# Patient Record
Sex: Female | Born: 1985 | Hispanic: No | Marital: Married | Smoking: Never smoker
Health system: Southern US, Community
[De-identification: ages and names within clinical notes are randomized; demographics above are authoritative.]

## PROBLEM LIST (undated history)

## (undated) ENCOUNTER — Inpatient Hospital Stay (HOSPITAL_COMMUNITY): Payer: Self-pay

## (undated) DIAGNOSIS — R519 Headache, unspecified: Secondary | ICD-10-CM

## (undated) DIAGNOSIS — K439 Ventral hernia without obstruction or gangrene: Secondary | ICD-10-CM

## (undated) DIAGNOSIS — N390 Urinary tract infection, site not specified: Secondary | ICD-10-CM

## (undated) DIAGNOSIS — O24419 Gestational diabetes mellitus in pregnancy, unspecified control: Secondary | ICD-10-CM

## (undated) DIAGNOSIS — O139 Gestational [pregnancy-induced] hypertension without significant proteinuria, unspecified trimester: Secondary | ICD-10-CM

## (undated) DIAGNOSIS — R51 Headache: Secondary | ICD-10-CM

## (undated) DIAGNOSIS — E039 Hypothyroidism, unspecified: Secondary | ICD-10-CM

## (undated) HISTORY — PX: HERNIA REPAIR: SHX51

## (undated) HISTORY — DX: Gestational diabetes mellitus in pregnancy, unspecified control: O24.419

## (undated) HISTORY — DX: Urinary tract infection, site not specified: N39.0

---

## 2010-01-18 NOTE — L&D Delivery Note (Signed)
Delivery Note At 2:52 AM a viable and healthy female was delivered via Vaginal, Spontaneous Delivery (Presentation: Left Occiput Anterior).  APGAR: 9, 9; weight 6 lbs 11oz.   Placenta status: Intact, Spontaneous.  Cord: 3 vessels with single nuchal cord.  Anesthesia: Epidural  Episiotomy: None Lacerations: 1st degree;Perineal, cervix inspected, uterus explored Suture Repair: 3.0 chromic Est. Blood Loss (mL): 350  Mom to postpartum.  Baby to nursery-stable.  Hadrian Yarbrough D 11/18/2010, 3:13 AM

## 2010-05-14 LAB — HEPATITIS B SURFACE ANTIGEN: Hepatitis B Surface Ag: NEGATIVE

## 2010-05-14 LAB — RPR: RPR: NONREACTIVE

## 2010-05-14 LAB — RUBELLA ANTIBODY, IGM: Rubella: IMMUNE

## 2010-05-14 LAB — ABO/RH

## 2010-05-14 LAB — ANTIBODY SCREEN: Antibody Screen: NEGATIVE

## 2010-08-02 ENCOUNTER — Encounter (HOSPITAL_COMMUNITY): Payer: Self-pay | Admitting: *Deleted

## 2010-08-02 ENCOUNTER — Inpatient Hospital Stay (HOSPITAL_COMMUNITY)
Admission: AD | Admit: 2010-08-02 | Discharge: 2010-08-02 | Disposition: A | Discharge status: Home / Self Care | Payer: Medicaid Other | Source: Ambulatory Visit | Attending: Obstetrics and Gynecology | Admitting: Obstetrics and Gynecology

## 2010-08-02 DIAGNOSIS — O9989 Other specified diseases and conditions complicating pregnancy, childbirth and the puerperium: Secondary | ICD-10-CM

## 2010-08-02 DIAGNOSIS — O99891 Other specified diseases and conditions complicating pregnancy: Secondary | ICD-10-CM

## 2010-08-02 DIAGNOSIS — K219 Gastro-esophageal reflux disease without esophagitis: Secondary | ICD-10-CM | POA: Diagnosis present

## 2010-08-02 DIAGNOSIS — R109 Unspecified abdominal pain: Secondary | ICD-10-CM | POA: Insufficient documentation

## 2010-08-02 DIAGNOSIS — Z3490 Encounter for supervision of normal pregnancy, unspecified, unspecified trimester: Secondary | ICD-10-CM

## 2010-08-02 HISTORY — DX: Gastro-esophageal reflux disease without esophagitis: K21.9

## 2010-08-02 HISTORY — DX: Hypothyroidism, unspecified: E03.9

## 2010-08-02 LAB — URINALYSIS, ROUTINE W REFLEX MICROSCOPIC
Glucose, UA: NEGATIVE mg/dL
Hgb urine dipstick: NEGATIVE
Specific Gravity, Urine: 1.01 (ref 1.005–1.030)
Urobilinogen, UA: 0.2 mg/dL (ref 0.0–1.0)
pH: 7 (ref 5.0–8.0)

## 2010-08-02 MED ORDER — RANITIDINE HCL 150 MG PO TABS: 150.0000 mg | ORAL_TABLET | Freq: Two times a day (BID) | ORAL | Status: DC

## 2010-08-02 NOTE — Progress Notes (Signed)
Patient is here with c/o epigastric and mid back pain. She states that she ate a bowl of cereal at 2300 and went to bed at 0030. She states that the pain was too intense to sleep. She states that the epigastric pain has eased up now. She does not appear to be in distress. She is breathing with ease. Denies any vaginal bleeding, lof or discharge.

## 2010-08-02 NOTE — ED Provider Notes (Signed)
History     Chief Complaint  Patient presents with  . Abdominal Pain   HPI G1 @ 22.[redacted] wks EGA. Onset of epigastric pain after laying down tonight, radiated through to back, no n/v, ate roast, rice, green beans for dinner, cereal right before bed. No cramping or vaginal bleeding.   OB History    Grav Para Term Preterm Abortions TAB SAB Ect Mult Living   1               Past Medical History  Diagnosis Date  . Hypothyroidism     History reviewed. No pertinent past surgical history.  No family history on file.  History  Substance Use Topics  . Smoking status: Never Smoker   . Smokeless tobacco: Not on file  . Alcohol Use: No    Allergies: No Known Allergies  Prescriptions prior to admission  Medication Sig Dispense Refill  . levothyroxine (SYNTHROID, LEVOTHROID) 25 MCG tablet Take 25 mcg by mouth daily.          Review of Systems  Constitutional: Negative.   Respiratory: Negative.   Cardiovascular: Negative.   Gastrointestinal: Positive for abdominal pain. Negative for nausea, vomiting, diarrhea and constipation.  Genitourinary: Negative.   Skin: Negative.   Neurological: Negative.   Psychiatric/Behavioral: Negative.    Physical Exam   Blood pressure 120/77, pulse 101, temperature 98.6 F (37 C), temperature source Oral, resp. rate 20, height 6' 4.5" (1.943 m), weight 78.382 kg (172 lb 12.8 oz).  Physical Exam  Constitutional: She is oriented to person, place, and time. She appears well-developed and well-nourished. No distress.  Cardiovascular: Normal rate.   Respiratory: Effort normal.  GI: Soft. Bowel sounds are normal. She exhibits no distension and no mass. There is no tenderness. There is no rebound and no guarding.  Musculoskeletal: Normal range of motion.  Neurological: She is alert and oriented to person, place, and time.  Skin: Skin is warm and dry.  Psychiatric: She has a normal mood and affect.    MAU Course  Procedures  Consult with Dr.  Henderson Cloud, may d/c to home with precautions.   A/P: 25 yo G1 @ 22.[redacted] wks EGA, GERD Rx ranitidine 150 mg BID PRN  F/U as scheduled or sooner PRN

## 2010-08-02 NOTE — Progress Notes (Signed)
Pt states, " Pain started in my upper abd at 12:30 am. It was hurting so bad I couldn't go to sleep. It hurts in my upper abd when I breathe."

## 2010-10-27 LAB — STREP B DNA PROBE: GBS: NEGATIVE

## 2010-11-17 ENCOUNTER — Inpatient Hospital Stay (HOSPITAL_COMMUNITY)
Admission: AD | Admit: 2010-11-17 | Discharge: 2010-11-20 | DRG: 775 | Disposition: A | Discharge status: Home / Self Care | Payer: Medicaid Other | Source: Ambulatory Visit | Attending: Obstetrics and Gynecology | Admitting: Obstetrics and Gynecology

## 2010-11-17 ENCOUNTER — Encounter (HOSPITAL_COMMUNITY): Payer: Self-pay | Admitting: *Deleted

## 2010-11-17 DIAGNOSIS — O99284 Endocrine, nutritional and metabolic diseases complicating childbirth: Secondary | ICD-10-CM | POA: Diagnosis present

## 2010-11-17 DIAGNOSIS — E039 Hypothyroidism, unspecified: Secondary | ICD-10-CM | POA: Diagnosis present

## 2010-11-17 DIAGNOSIS — E079 Disorder of thyroid, unspecified: Secondary | ICD-10-CM | POA: Diagnosis present

## 2010-11-17 DIAGNOSIS — O4290 Premature rupture of membranes, unspecified as to length of time between rupture and onset of labor, unspecified weeks of gestation: Secondary | ICD-10-CM | POA: Diagnosis present

## 2010-11-17 LAB — CBC
MCV: 91.6 fL (ref 78.0–100.0)
Platelets: 218 10*3/uL (ref 150–400)
RBC: 3.91 MIL/uL (ref 3.87–5.11)
RDW: 14.6 % (ref 11.5–15.5)
WBC: 10.9 10*3/uL — ABNORMAL HIGH (ref 4.0–10.5)

## 2010-11-17 LAB — RPR: RPR Ser Ql: NONREACTIVE

## 2010-11-17 MED ORDER — OXYTOCIN 20 UNITS IN LACTATED RINGERS INFUSION - SIMPLE
1.0000 m[IU]/min | INTRAVENOUS | Status: DC
  Administered 2010-11-17: 2 m[IU]/min via INTRAVENOUS
  Filled 2010-11-17: qty 1000

## 2010-11-17 MED ORDER — IBUPROFEN 600 MG PO TABS
600.0000 mg | ORAL_TABLET | Freq: Four times a day (QID) | ORAL | Status: DC | PRN
  Filled 2010-11-17 (×6): qty 1

## 2010-11-17 MED ORDER — OXYCODONE-ACETAMINOPHEN 5-325 MG PO TABS
2.0000 | ORAL_TABLET | ORAL | Status: DC | PRN
  Administered 2010-11-17: 2 via ORAL
  Filled 2010-11-17: qty 2
  Filled 2010-11-17: qty 1
  Filled 2010-11-17: qty 2

## 2010-11-17 MED ORDER — FENTANYL 2.5 MCG/ML BUPIVACAINE 1/10 % EPIDURAL INFUSION (WH - ANES)
14.0000 mL/h | INTRAMUSCULAR | Status: DC
  Administered 2010-11-18: 14 mL/h via EPIDURAL
  Filled 2010-11-17: qty 60

## 2010-11-17 MED ORDER — EPHEDRINE 5 MG/ML INJ
10.0000 mg | INTRAVENOUS | Status: DC | PRN
  Filled 2010-11-17 (×2): qty 4

## 2010-11-17 MED ORDER — PHENYLEPHRINE 40 MCG/ML (10ML) SYRINGE FOR IV PUSH (FOR BLOOD PRESSURE SUPPORT)
80.0000 ug | PREFILLED_SYRINGE | INTRAVENOUS | Status: DC | PRN
  Filled 2010-11-17 (×2): qty 5

## 2010-11-17 MED ORDER — LIDOCAINE HCL (PF) 1 % IJ SOLN
30.0000 mL | INTRAMUSCULAR | Status: DC | PRN
  Filled 2010-11-17 (×2): qty 30

## 2010-11-17 MED ORDER — ACETAMINOPHEN 325 MG PO TABS
650.0000 mg | ORAL_TABLET | ORAL | Status: DC | PRN
  Administered 2010-11-17: 650 mg via ORAL
  Filled 2010-11-17: qty 2

## 2010-11-17 MED ORDER — OXYTOCIN 20 UNITS IN LACTATED RINGERS INFUSION - SIMPLE: 125.0000 mL/h | Freq: Once | INTRAVENOUS | Status: DC

## 2010-11-17 MED ORDER — FLEET ENEMA 7-19 GM/118ML RE ENEM: 1.0000 | ENEMA | RECTAL | Status: DC | PRN

## 2010-11-17 MED ORDER — ONDANSETRON HCL 4 MG/2ML IJ SOLN: 4.0000 mg | Freq: Four times a day (QID) | INTRAMUSCULAR | Status: DC | PRN

## 2010-11-17 MED ORDER — CITRIC ACID-SODIUM CITRATE 334-500 MG/5ML PO SOLN: 30.0000 mL | ORAL | Status: DC | PRN

## 2010-11-17 MED ORDER — LACTATED RINGERS IV SOLN: 500.0000 mL | INTRAVENOUS | Status: DC | PRN

## 2010-11-17 MED ORDER — PHENYLEPHRINE 40 MCG/ML (10ML) SYRINGE FOR IV PUSH (FOR BLOOD PRESSURE SUPPORT)
80.0000 ug | PREFILLED_SYRINGE | INTRAVENOUS | Status: DC | PRN
  Filled 2010-11-17: qty 5

## 2010-11-17 MED ORDER — EPHEDRINE 5 MG/ML INJ
10.0000 mg | INTRAVENOUS | Status: DC | PRN
  Filled 2010-11-17: qty 4

## 2010-11-17 MED ORDER — OXYTOCIN BOLUS FROM INFUSION
500.0000 mL | Freq: Once | INTRAVENOUS | Status: AC
  Administered 2010-11-18: 500 mL via INTRAVENOUS
  Filled 2010-11-17: qty 500

## 2010-11-17 MED ORDER — BUTORPHANOL TARTRATE 2 MG/ML IJ SOLN: 1.0000 mg | INTRAMUSCULAR | Status: DC | PRN

## 2010-11-17 MED ORDER — TERBUTALINE SULFATE 1 MG/ML IJ SOLN: 0.2500 mg | Freq: Once | INTRAMUSCULAR | Status: AC | PRN

## 2010-11-17 MED ORDER — LEVOTHYROXINE SODIUM 25 MCG PO TABS
25.0000 ug | ORAL_TABLET | Freq: Every day | ORAL | Status: DC
  Filled 2010-11-17: qty 1

## 2010-11-17 MED ORDER — LACTATED RINGERS IV SOLN
INTRAVENOUS | Status: DC
  Administered 2010-11-17 (×2): via INTRAVENOUS

## 2010-11-17 MED ORDER — LACTATED RINGERS IV SOLN: 500.0000 mL | Freq: Once | INTRAVENOUS | Status: DC

## 2010-11-17 MED ORDER — DIPHENHYDRAMINE HCL 50 MG/ML IJ SOLN: 12.5000 mg | INTRAMUSCULAR | Status: DC | PRN

## 2010-11-17 NOTE — Progress Notes (Signed)
Pt states went to MD office today for eval of vaginal fluid, +amniotic fluid, to be a direct admission, however [facility] no available beds. +FM today, denies bleeding or other vaginal d/c issues prior to SROM. Unsure of specific time yesterday SROM occurred.

## 2010-11-17 NOTE — H&P (Signed)
  25 y.o. G 1P 0  Estimated Date of Delivery: 12/01/10 admitted at [redacted] weeks gestation with PROM. Prenatal course was uncomplicated.  Patient presented to the office this morning with a 2 day history of leaking fluid.  Ultrasound today showed normal AFV and vertex presentation.  Speculum exam showed pooling and the sample from the pool was strongly fern positive. Prenatal labs: Blood Type: O+.  Screening tests for HIV, Syphilis, Hepatitis B, Rubella sensitivity, gestational diabetes, and perineal group B strep colonization were negative.    Afebrile, VSS Heart and Lungs: No active disease Abdomen: soft, gravid, EFW 6 - 7 lbs. Cervical exam:  Deferred due to PROM  Impression: PROM, 38 weeks  Plan:  Start IV pitocin, start cervical checks when patient enters active labor.

## 2010-11-17 NOTE — Plan of Care (Signed)
Problem: Consults Goal: [facility] Patient Information Press F2 to bring up selections list  Outcome: Completed/Met Date Met:  11/17/10  Pt 37-[redacted] weeks EGA and Inpatient induction

## 2010-11-18 ENCOUNTER — Inpatient Hospital Stay (HOSPITAL_COMMUNITY): Payer: Medicaid Other | Admitting: Anesthesiology

## 2010-11-18 ENCOUNTER — Encounter (HOSPITAL_COMMUNITY): Payer: Self-pay | Admitting: Anesthesiology

## 2010-11-18 ENCOUNTER — Encounter (HOSPITAL_COMMUNITY): Payer: Self-pay

## 2010-11-18 LAB — ABO/RH: ABO/RH(D): O POS

## 2010-11-18 MED ORDER — LEVOTHYROXINE SODIUM 25 MCG PO TABS
25.0000 ug | ORAL_TABLET | Freq: Every day | ORAL | Status: DC
  Administered 2010-11-18 – 2010-11-20 (×3): 25 ug via ORAL
  Filled 2010-11-18 (×4): qty 1

## 2010-11-18 MED ORDER — BENZOCAINE-MENTHOL 20-0.5 % EX AERO
1.0000 "application " | INHALATION_SPRAY | CUTANEOUS | Status: DC | PRN
  Administered 2010-11-18 – 2010-11-19 (×2): 1 via TOPICAL
  Filled 2010-11-18: qty 56

## 2010-11-18 MED ORDER — ZOLPIDEM TARTRATE 5 MG PO TABS: 5.0000 mg | ORAL_TABLET | Freq: Every evening | ORAL | Status: DC | PRN

## 2010-11-18 MED ORDER — SENNOSIDES-DOCUSATE SODIUM 8.6-50 MG PO TABS
2.0000 | ORAL_TABLET | Freq: Every day | ORAL | Status: DC
  Administered 2010-11-18 – 2010-11-19 (×2): 2 via ORAL

## 2010-11-18 MED ORDER — LIDOCAINE HCL 1.5 % IJ SOLN
INTRAMUSCULAR | Status: DC | PRN
  Administered 2010-11-18: 2 mL via INTRADERMAL
  Administered 2010-11-18 (×2): 5 mL via INTRADERMAL

## 2010-11-18 MED ORDER — FAMOTIDINE 20 MG PO TABS
20.0000 mg | ORAL_TABLET | Freq: Every day | ORAL | Status: DC
  Administered 2010-11-19: 20 mg via ORAL
  Filled 2010-11-18 (×3): qty 1

## 2010-11-18 MED ORDER — INFLUENZA VIRUS VACC SPLIT PF IM SUSP
0.5000 mL | Freq: Once | INTRAMUSCULAR | Status: AC
  Administered 2010-11-19: 0.5 mL via INTRAMUSCULAR
  Filled 2010-11-18: qty 0.5

## 2010-11-18 MED ORDER — LANOLIN HYDROUS EX OINT: TOPICAL_OINTMENT | CUTANEOUS | Status: DC | PRN

## 2010-11-18 MED ORDER — SIMETHICONE 80 MG PO CHEW: 80.0000 mg | CHEWABLE_TABLET | ORAL | Status: DC | PRN

## 2010-11-18 MED ORDER — PRENATAL PLUS 27-1 MG PO TABS
1.0000 | ORAL_TABLET | Freq: Every day | ORAL | Status: DC
  Administered 2010-11-18 – 2010-11-20 (×3): 1 via ORAL
  Filled 2010-11-18 (×4): qty 1

## 2010-11-18 MED ORDER — ONDANSETRON HCL 4 MG/2ML IJ SOLN: 4.0000 mg | INTRAMUSCULAR | Status: DC | PRN

## 2010-11-18 MED ORDER — OXYCODONE-ACETAMINOPHEN 5-325 MG PO TABS
1.0000 | ORAL_TABLET | ORAL | Status: DC | PRN
  Administered 2010-11-18 – 2010-11-19 (×2): 1 via ORAL
  Administered 2010-11-20: 2 via ORAL
  Filled 2010-11-18: qty 1

## 2010-11-18 MED ORDER — DIBUCAINE 1 % RE OINT
1.0000 "application " | TOPICAL_OINTMENT | RECTAL | Status: DC | PRN
  Filled 2010-11-18: qty 28

## 2010-11-18 MED ORDER — ONDANSETRON HCL 4 MG PO TABS: 4.0000 mg | ORAL_TABLET | ORAL | Status: DC | PRN

## 2010-11-18 MED ORDER — IBUPROFEN 600 MG PO TABS
600.0000 mg | ORAL_TABLET | Freq: Four times a day (QID) | ORAL | Status: DC
  Administered 2010-11-18 – 2010-11-20 (×10): 600 mg via ORAL
  Filled 2010-11-18 (×4): qty 1

## 2010-11-18 MED ORDER — DIPHENHYDRAMINE HCL 25 MG PO CAPS: 25.0000 mg | ORAL_CAPSULE | Freq: Four times a day (QID) | ORAL | Status: DC | PRN

## 2010-11-18 MED ORDER — WITCH HAZEL-GLYCERIN EX PADS: 1.0000 "application " | MEDICATED_PAD | CUTANEOUS | Status: DC | PRN

## 2010-11-18 MED ORDER — BENZOCAINE-MENTHOL 20-0.5 % EX AERO
INHALATION_SPRAY | CUTANEOUS | Status: AC
  Administered 2010-11-18: 1 via TOPICAL
  Filled 2010-11-18: qty 56

## 2010-11-18 MED ORDER — TETANUS-DIPHTH-ACELL PERTUSSIS 5-2.5-18.5 LF-MCG/0.5 IM SUSP
0.5000 mL | Freq: Once | INTRAMUSCULAR | Status: AC
  Administered 2010-11-19: 0.5 mL via INTRAMUSCULAR
  Filled 2010-11-18: qty 0.5

## 2010-11-18 NOTE — Progress Notes (Signed)
UR chart review completed.  

## 2010-11-18 NOTE — Anesthesia Preprocedure Evaluation (Signed)
Anesthesia Evaluation  Patient identified by MRN, date of birth, ID band Patient awake  General Assessment Comment  Reviewed: Allergy & Precautions, H&P , NPO status , Patient's Chart, lab work & pertinent test results, reviewed documented beta blocker date and time   History of Anesthesia Complications Negative for: history of anesthetic complications  Airway Mallampati: II TM Distance: >3 FB Neck ROM: full    Dental  (+) Teeth Intact   Pulmonary  clear to auscultation        Cardiovascular regular Normal    Neuro/Psych Negative Neurological ROS  Negative Psych ROS   GI/Hepatic Neg liver ROS  GERD Medicated  Endo/Other  Hypothyroidism   Renal/GU negative Renal ROS     Musculoskeletal   Abdominal   Peds  Hematology negative hematology ROS (+)   Anesthesia Other Findings   Reproductive/Obstetrics (+) Pregnancy                           Anesthesia Physical Anesthesia Plan  ASA: II  Anesthesia Plan: Epidural   Post-op Pain Management:    Induction:   Airway Management Planned:   Additional Equipment:   Intra-op Plan:   Post-operative Plan:   Informed Consent: I have reviewed the patients History and Physical, chart, labs and discussed the procedure including the risks, benefits and alternatives for the proposed anesthesia with the patient or authorized representative who has indicated his/her understanding and acceptance.     Plan Discussed with:   Anesthesia Plan Comments:         Anesthesia Quick Evaluation

## 2010-11-18 NOTE — Progress Notes (Signed)
Stable; breast feeding.  On .025 mg. of synthroid for hypothyroidism -- ordered for this AM and daily.  CBC ordered for tomorrow.

## 2010-11-18 NOTE — Anesthesia Postprocedure Evaluation (Signed)
  Anesthesia Post-op Note  Patient: Catherine Schroeder  Procedure(s) Performed: * No procedures listed *  Patient Location: Mother/Baby  Anesthesia Type: Epidural  Level of Consciousness: awake, alert  and oriented  Airway and Oxygen Therapy: Patient Spontanous Breathing  Post-op Pain: none  Post-op Assessment: Post-op Vital signs reviewed and Patient's Cardiovascular Status Stable  Post-op Vital Signs: Reviewed and stable  Complications: No apparent anesthesia complications

## 2010-11-18 NOTE — Anesthesia Procedure Notes (Signed)
Epidural Patient location during procedure: OB Start time: 11/18/2010 12:26 AM Reason for block: procedure for pain  Staffing Performed by: anesthesiologist   Preanesthetic Checklist Completed: patient identified, site marked, surgical consent, pre-op evaluation, timeout performed, IV checked, risks and benefits discussed and monitors and equipment checked  Epidural Patient position: sitting Prep: site prepped and draped and DuraPrep Patient monitoring: continuous pulse ox and blood pressure Approach: midline Injection technique: LOR air  Needle:  Needle type: Tuohy  Needle gauge: 17 G Needle length: 9 cm Needle insertion depth: 5 cm cm Catheter type: closed end flexible Catheter size: 19 Gauge Catheter at skin depth: 10 cm Test dose: negative  Assessment Events: blood not aspirated, injection not painful, no injection resistance, negative IV test and no paresthesia  Additional Notes Discussed risk of headache, infection, bleeding, nerve injury and failed or incomplete block.  Patient voices understanding and wishes to proceed.

## 2010-11-19 DIAGNOSIS — Z23 Encounter for immunization: Secondary | ICD-10-CM

## 2010-11-19 LAB — CBC
Platelets: 197 10*3/uL (ref 150–400)
RDW: 14.8 % (ref 11.5–15.5)
WBC: 15.7 10*3/uL — ABNORMAL HIGH (ref 4.0–10.5)

## 2010-11-19 MED ORDER — BENZOCAINE-MENTHOL 20-0.5 % EX AERO
INHALATION_SPRAY | CUTANEOUS | Status: AC
  Administered 2010-11-19: 1 via TOPICAL
  Filled 2010-11-19: qty 56

## 2010-11-19 NOTE — Progress Notes (Signed)
PPD#1 Pt without c/o. Ambulating well. Lochia-mild. IMP/stable. PLAN/ routine care.

## 2010-11-20 NOTE — Discharge Summary (Signed)
RN reviewed discharge instructions with patient. No new medications were prescribed for the patient, she was instructed to take over the counter ibuprofen as needed for pain. Patient verbalized understanding. She was excited about going home. Patient was walked off the unit by RN with her baby and husband. Patient left the campus in a private vehicle with baby and husband.

## 2010-11-20 NOTE — Discharge Summary (Signed)
Obstetric Discharge Summary Reason for Admission: rupture of membranes Prenatal Procedures: none Intrapartum Procedures: spontaneous vaginal delivery Postpartum Procedures: none Complications-Operative and Postpartum: 1 degree perineal laceration Hemoglobin  Date Value Range Status  11/19/2010 11.4* 12.0-15.0 (g/dL) Final     HCT  Date Value Range Status  11/19/2010 33.2* 36.0-46.0 (%) Final    Discharge Diagnoses: Term Pregnancy-delivered  Discharge Information: Date: 11/20/2010 Activity: pelvic rest Diet: routine Medications: Ibuprofen Condition: stable Instructions: refer to practice specific booklet Discharge to: home Follow-up Information    Follow up with [facility]. Make an appointment in 4 months.   Contact information:   [address] [place] [address] [phone]          Newborn Data: Live born female  Birth Weight: 6 lb 11.4 oz (3045 g) APGAR: 9, 9  Home with mother.  Catherine Schroeder A 11/20/2010, 7:45 AM

## 2010-11-20 NOTE — Progress Notes (Signed)
Patient is eating, ambulating, voiding.  Pain control is good.  Filed Vitals:   11/19/10 0636 11/19/10 1445 11/19/10 2053 11/20/10 0600  BP: 110/71 109/67 112/75 109/69  Pulse: 84 88 82 67  Temp: 98.6 F (37 C) 98.4 F (36.9 C) 98.2 F (36.8 C) 98.2 F (36.8 C)  TempSrc: Axillary Oral Oral Axillary  Resp: 18 18 18 18   Height:      Weight:      SpO2:        Fundus firm Perineum without swelling.  Lab Results  Component Value Date   WBC 15.7* 11/19/2010   HGB 11.4* 11/19/2010   HCT 33.2* 11/19/2010   MCV 91.5 11/19/2010   PLT 197 11/19/2010    --/--/O POS (10/30 1730)/RI  A/P Post partum day 2.  Routine care.  Expect d/c per plan.    Esco Joslyn A

## 2011-01-08 ENCOUNTER — Encounter (HOSPITAL_COMMUNITY)
Admission: EM | Disposition: A | Discharge status: Home / Self Care | Payer: Self-pay | Source: Home / Self Care | Attending: Gastroenterology

## 2011-01-08 ENCOUNTER — Emergency Department (HOSPITAL_COMMUNITY): Payer: Medicaid Other

## 2011-01-08 ENCOUNTER — Inpatient Hospital Stay (HOSPITAL_COMMUNITY)
Admission: EM | Admit: 2011-01-08 | Discharge: 2011-01-10 | DRG: 419 | Disposition: A | Discharge status: Home / Self Care | Payer: Medicaid Other | Attending: Gastroenterology | Admitting: Gastroenterology

## 2011-01-08 ENCOUNTER — Encounter (HOSPITAL_COMMUNITY): Payer: Self-pay | Admitting: Emergency Medicine

## 2011-01-08 DIAGNOSIS — K8060 Calculus of gallbladder and bile duct with cholecystitis, unspecified, without obstruction: Principal | ICD-10-CM | POA: Diagnosis present

## 2011-01-08 DIAGNOSIS — K831 Obstruction of bile duct: Secondary | ICD-10-CM

## 2011-01-08 DIAGNOSIS — E039 Hypothyroidism, unspecified: Secondary | ICD-10-CM | POA: Diagnosis present

## 2011-01-08 DIAGNOSIS — K5900 Constipation, unspecified: Secondary | ICD-10-CM | POA: Diagnosis not present

## 2011-01-08 DIAGNOSIS — R109 Unspecified abdominal pain: Secondary | ICD-10-CM

## 2011-01-08 DIAGNOSIS — K8064 Calculus of gallbladder and bile duct with chronic cholecystitis without obstruction: Principal | ICD-10-CM | POA: Diagnosis present

## 2011-01-08 DIAGNOSIS — K8020 Calculus of gallbladder without cholecystitis without obstruction: Secondary | ICD-10-CM

## 2011-01-08 DIAGNOSIS — K8010 Calculus of gallbladder with chronic cholecystitis without obstruction: Secondary | ICD-10-CM

## 2011-01-08 DIAGNOSIS — R740 Nonspecific elevation of levels of transaminase and lactic acid dehydrogenase [LDH]: Secondary | ICD-10-CM

## 2011-01-08 DIAGNOSIS — R932 Abnormal findings on diagnostic imaging of liver and biliary tract: Secondary | ICD-10-CM

## 2011-01-08 HISTORY — PX: ERCP: SHX5425

## 2011-01-08 HISTORY — PX: EUS: SHX5427

## 2011-01-08 LAB — CBC
HCT: 41.9 % (ref 36.0–46.0)
MCH: 31.4 pg (ref 26.0–34.0)
MCH: 30.8 pg (ref 26.0–34.0)
MCHC: 34.9 g/dL (ref 30.0–36.0)
MCHC: 33.9 g/dL (ref 30.0–36.0)
MCV: 89.9 fL (ref 78.0–100.0)
MCV: 90.9 fL (ref 78.0–100.0)
RDW: 14.1 % (ref 11.5–15.5)
WBC: 7.7 10*3/uL (ref 4.0–10.5)
WBC: 7.9 10*3/uL (ref 4.0–10.5)

## 2011-01-08 LAB — DIFFERENTIAL
Basophils Relative: 0 % (ref 0–1)
Eosinophils Absolute: 0.1 10*3/uL (ref 0.0–0.7)
Lymphs Abs: 2.2 10*3/uL (ref 0.7–4.0)
Monocytes Absolute: 0.5 10*3/uL (ref 0.1–1.0)
Monocytes Relative: 7 % (ref 3–12)
Neutro Abs: 4.9 10*3/uL (ref 1.7–7.7)
Neutrophils Relative %: 63 % (ref 43–77)

## 2011-01-08 LAB — LIPASE, BLOOD
Lipase: 59 U/L (ref 11–59)
Lipase: 48 U/L (ref 11–59)

## 2011-01-08 LAB — POCT PREGNANCY, URINE: Preg Test, Ur: NEGATIVE

## 2011-01-08 LAB — URINALYSIS, ROUTINE W REFLEX MICROSCOPIC
Bilirubin Urine: NEGATIVE
Nitrite: NEGATIVE
Specific Gravity, Urine: 1.023 (ref 1.005–1.030)
Urobilinogen, UA: 1 mg/dL (ref 0.0–1.0)

## 2011-01-08 LAB — COMPREHENSIVE METABOLIC PANEL
Alkaline Phosphatase: 238 U/L — ABNORMAL HIGH (ref 39–117)
BUN: 13 mg/dL (ref 6–23)
Chloride: 103 mEq/L (ref 96–112)
Creatinine, Ser: 0.67 mg/dL (ref 0.50–1.10)
GFR calc Af Amer: >90 mL/min (ref >90)
GFR calc non Af Amer: >90 mL/min (ref >90)
Glucose, Bld: 99 mg/dL (ref 70–99)
Potassium: 3.5 mEq/L (ref 3.5–5.1)
Total Bilirubin: 1.6 mg/dL — ABNORMAL HIGH (ref 0.3–1.2)

## 2011-01-08 SURGERY — UPPER ENDOSCOPIC ULTRASOUND (EUS) RADIAL
Anesthesia: Moderate Sedation

## 2011-01-08 SURGERY — ERCP, WITH INTERVENTION IF INDICATED
Anesthesia: Monitor Anesthesia Care

## 2011-01-08 MED ORDER — MIDAZOLAM HCL 10 MG/2ML IJ SOLN
INTRAMUSCULAR | Status: DC | PRN
  Administered 2011-01-08: 4 mg via INTRAVENOUS
  Administered 2011-01-08 (×4): 2 mg via INTRAVENOUS

## 2011-01-08 MED ORDER — SODIUM CHLORIDE 0.9 % IV SOLN
1.5000 g | Freq: Once | INTRAVENOUS | Status: AC
  Administered 2011-01-08: 1.5 g via INTRAVENOUS
  Filled 2011-01-08: qty 1.5

## 2011-01-08 MED ORDER — HYDROCODONE-ACETAMINOPHEN 5-325 MG PO TABS
1.0000 | ORAL_TABLET | ORAL | Status: DC | PRN
  Administered 2011-01-09 (×2): 1 via ORAL
  Filled 2011-01-08 (×2): qty 1

## 2011-01-08 MED ORDER — HYDROMORPHONE HCL PF 1 MG/ML IJ SOLN
1.0000 mg | Freq: Once | INTRAMUSCULAR | Status: AC
  Administered 2011-01-08: 1 mg via INTRAVENOUS
  Filled 2011-01-08: qty 1

## 2011-01-08 MED ORDER — ONDANSETRON HCL 4 MG/2ML IJ SOLN: 4.0000 mg | Freq: Four times a day (QID) | INTRAMUSCULAR | Status: DC | PRN

## 2011-01-08 MED ORDER — HYDROMORPHONE HCL PF 1 MG/ML IJ SOLN: 1.0000 mg | INTRAMUSCULAR | Status: DC | PRN

## 2011-01-08 MED ORDER — AMPICILLIN-SULBACTAM SODIUM 3 (2-1) G IJ SOLR
INTRAMUSCULAR | Status: AC
  Filled 2011-01-08: qty 3

## 2011-01-08 MED ORDER — KCL IN DEXTROSE-NACL 20-5-0.45 MEQ/L-%-% IV SOLN
INTRAVENOUS | Status: DC
  Administered 2011-01-08: 100 mL via INTRAVENOUS
  Filled 2011-01-08 (×4): qty 1000

## 2011-01-08 MED ORDER — DOCUSATE SODIUM 100 MG PO CAPS
100.0000 mg | ORAL_CAPSULE | Freq: Two times a day (BID) | ORAL | Status: DC
  Administered 2011-01-08 (×2): 100 mg via ORAL
  Filled 2011-01-08 (×7): qty 1

## 2011-01-08 MED ORDER — ONDANSETRON HCL 4 MG PO TABS: 4.0000 mg | ORAL_TABLET | Freq: Four times a day (QID) | ORAL | Status: DC | PRN

## 2011-01-08 MED ORDER — FENTANYL CITRATE 0.05 MG/ML IJ SOLN
INTRAMUSCULAR | Status: DC | PRN
  Administered 2011-01-08 (×6): 25 ug via INTRAVENOUS

## 2011-01-08 MED ORDER — SODIUM CHLORIDE 0.9 % IV SOLN
INTRAVENOUS | Status: DC | PRN
  Administered 2011-01-08: 14:00:00

## 2011-01-08 MED ORDER — DIPHENHYDRAMINE HCL 50 MG/ML IJ SOLN
INTRAMUSCULAR | Status: DC | PRN
  Administered 2011-01-08 (×2): 25 mg via INTRAVENOUS

## 2011-01-08 MED ORDER — ONDANSETRON HCL 4 MG/2ML IJ SOLN
4.0000 mg | Freq: Once | INTRAMUSCULAR | Status: AC
  Administered 2011-01-08: 4 mg via INTRAVENOUS
  Filled 2011-01-08: qty 2

## 2011-01-08 MED ORDER — LEVOTHYROXINE SODIUM 25 MCG PO TABS
25.0000 ug | ORAL_TABLET | Freq: Every day | ORAL | Status: DC
  Administered 2011-01-10: 25 ug via ORAL
  Filled 2011-01-08 (×2): qty 1

## 2011-01-08 MED ORDER — BISACODYL 5 MG PO TBEC
5.0000 mg | DELAYED_RELEASE_TABLET | Freq: Every day | ORAL | Status: DC | PRN
  Filled 2011-01-08: qty 1

## 2011-01-08 NOTE — Consult Note (Signed)
CCS Attending:  Patient seen and examined in ER room 14.  Discussed today with Dr. Christella Hartigan.  Successful ERCP with stone extraction.  To be admitted by GI.  Plan for lap chole in AM 12/22 if OR schedule allows.  Will discuss with my partners, Dr. Derrell Lolling and Dr. Biagio Quint.  They will re-evaluate patient in AM.  The risks and benefits of the procedure have been discussed at length with the patient and her husband.  The patient understands the proposed procedure, potential alternative treatments, and the course of recovery to be expected.  All of the patient's questions have been answered at this time.  The patient wishes to proceed with surgery.  Velora Heckler, MD, FACS General & Endocrine Surgery [facility]

## 2011-01-08 NOTE — H&P (Signed)
[facility]   Chief complaint abd pain, elevated liver tests   HPI:  Catherine Schroeder is a 25 y.o. female, very pleasant here with her 1 week old daughter and her husband.  She had epigastric pain without nausea/vomiting last night before bed and then was again awoken this AM with the pain and came to ER.  No fever, chills, no nausea/vomiting.  The pain is gone now in ER after some IV narcotics.  Her liver tests are elevated and US shows signfiicant gallstone disease. See those results below.     Past Medical History  Diagnosis Date  . Hypothyroidism     History reviewed. No pertinent past surgical history.  Prior to Admission medications   Medication Sig Start Date End Date Taking? Authorizing Provider  butalbital-acetaminophen-caffeine (FIORICET, ESGIC) 50-325-40 MG per tablet Take 1 tablet by mouth every 6 (six) hours as needed. headache    Yes Historical Provider, MD  levothyroxine (SYNTHROID, LEVOTHROID) 25 MCG tablet Take 25 mcg by mouth daily.    Yes Historical Provider, MD  prenatal vitamin w/FE, FA (PRENATAL 1 + 1) 27-1 MG TABS Take 1 tablet by mouth daily.    Yes Historical Provider, MD  ranitidine (ZANTAC) 150 MG tablet Take 1 tablet (150 mg total) by mouth 2 (two) times daily. 08/02/10 08/02/11 Yes Georges Mouse, CNM    Current Facility-Administered Medications  Medication Dose Route Frequency Provider Last Rate Last Dose  . HYDROmorphone (DILAUDID) injection 1 mg  1 mg Intravenous Once Hilario Quarry, MD   1 mg at 01/08/11 0816  . ondansetron (ZOFRAN) injection 4 mg  4 mg Intravenous Once Hilario Quarry, MD   4 mg at 01/08/11 1610   Current Outpatient Prescriptions  Medication Sig Dispense Refill  . butalbital-acetaminophen-caffeine (FIORICET, ESGIC) 50-325-40 MG per tablet Take 1 tablet by mouth every 6 (six) hours as needed. headache       . levothyroxine (SYNTHROID, LEVOTHROID) 25 MCG tablet Take 25 mcg by mouth daily.       . prenatal vitamin w/FE, FA  (PRENATAL 1 + 1) 27-1 MG TABS Take 1 tablet by mouth daily.       . ranitidine (ZANTAC) 150 MG tablet Take 1 tablet (150 mg total) by mouth 2 (two) times daily.  60 tablet  1    Allergies as of 01/08/2011  . (No Known Allergies)    History reviewed. No pertinent family history.  History   Social History  . Marital Status: Married    Spouse Name: N/A    Number of Children: N/A  . Years of Education: N/A   Occupational History  . Not on file.   Social History Main Topics  . Smoking status: Never Smoker   . Smokeless tobacco: Not on file  . Alcohol Use: No  . Drug Use: No  . Sexually Active: Yes   Other Topics Concern  . Not on file   Social History Narrative  . No narrative on file     Review of Systems: Pertinent positive and negative review of systems were noted in the above HPI section. Complete review of systems was performed and was otherwise normal.   Physical Exam: Vital signs in last 24 hours: Temp:  [97.5 F (36.4 C)] 97.5 F (36.4 C) (12/21 0526) Pulse Rate:  [86-87] 87  (12/21 0531) Resp:  [16] 16  (12/21 0531) BP: (118-127)/(86-90) 127/90 mmHg (12/21 0531) SpO2:  [99 %] 99 % (12/21 0531)   Constitutional: generally well-appearing Psychiatric: alert  and oriented x3 Eyes: extraocular movements intact Mouth: oral pharynx moist, no lesions Neck: supple no lymphadenopathy Cardiovascular: heart regular rate and rhythm Lungs: clear to auscultation bilaterally Abdomen: soft, nontender, nondistended, no obvious ascites, no peritoneal signs, normal bowel sounds Extremities: no lower extremity edema bilaterally Skin: no lesions on visible extremities   Intake/Output from previous day: 12/20 0701 - 12/21 0700 In: -  Out: 100 [Urine:100] Intake/Output this shift:    Lab Results:  [facility] 01/08/11 0645  WBC 7.7  HGB 13.7  HCT 39.3  PLT 282  MCV 89.9   BMET  Basename 01/08/11 0645  NA 138  K 3.5  CL 103  CO2 25  GLUCOSE 99  BUN 13    CREATININE 0.67  CALCIUM 10.0   LFT  Basename 01/08/11 0645  BILITOT 1.6*  BILIDIR --  IBILI --  AST 596*  ALT 750*  ALKPHOS 238*  PROT 7.7  ALBUMIN 3.9   Imaging/Other Results: US Abdomen Complete  01/08/2011  *RADIOLOGY REPORT*  Clinical Data:  25 year old female with right upper quadrant pain. Back pain.  The patient is 7 weeks postpartum.  COMPLETE ABDOMINAL ULTRASOUND  Comparison:  None.  Findings:  Gallbladder:  Layering sludge.  Multiple small shadowing gallstones (individually perhaps up to seven or 8 mm in diameter).  Positive sonographic Murphy's sign.  Gallbladder wall thickness remains normal at 2 mm.  Common bile duct:  Dilated at 9 mm diameter.  Liver:  Positive for intrahepatic biliary ductal dilatation.  No focal liver lesion.  IVC:  Appears normal.  Pancreas:  Echotexture at the lower limits of normal, otherwise negative.  Spleen:  Normal measuring 5.4 cm in length.  Right Kidney:  Normal measuring 10.8 cm in length.  Left Kidney:  Normal measuring 11.2 cm in length.  Abdominal aorta:  No aneurysm identified.  IMPRESSION: 1. Acute biliary obstruction.  Given multiple small gallstones favor choledocholithiasis. 2.  Positive sonographic Murphy's sign with cholelithiasis and gallbladder sludge.  Gallbladder wall thickness is normal at this time.  Study discussed by telephone with Dr. Margarita Grizzle on 01/08/2011 at 0844 hours.  Original Report Authenticated By: Harley Hallmark, M.D.      Impression/Plan: 25 y.o. female with gallstone disease, likely CBD stones based on dilated duct, elevated LFTs  We are going to admit her to my service, planning on EUS to confirm stones in CBD and then ERCP at same time.  I discussed case with [facility] Dr. Gerrit Friends and he agrees with ERCP.  She did not eat BF and so we will plan for ERCP later this AM.    Rob Bunting, MD  01/08/2011, 9:10 AM [facility] Pager [phone]

## 2011-01-08 NOTE — ED Notes (Signed)
Admitting doctor in room evaluating patient.

## 2011-01-08 NOTE — ED Notes (Signed)
Portable equipment called, breast milk pump at bedside. Pt pumping milk right now and going to endo afterward

## 2011-01-08 NOTE — ED Notes (Signed)
Endoscopy called and will come to pick pt up to endo at 1015

## 2011-01-08 NOTE — ED Notes (Addendum)
Pt c/o sudden onset of RUQ pain radiating into her R back starting at 0200 this am, increase pain w/deep breathing, pain if she brings her legs into her abd, while laying on her back or R side, pain eases off with legs straight or laying on L side. Pt denies N/V/D, problems w/urinating, or chest pain.

## 2011-01-08 NOTE — Op Note (Signed)
[facility] [address] [place], [place]  [address]  ERCP PROCEDURE REPORT  PATIENT:  Catherine Schroeder, Catherine Schroeder  MR#:  [id number] BIRTHDATE:  1985-08-31  GENDER:  female ENDOSCOPIST:  Rachael Fee, MD PROCEDURE DATE:  01/08/2011 PROCEDURE:  ERCP with removal of stones, ERCP with sphincterotomy ASA CLASS:  Class I INDICATIONS:  gallstones in GB, elevated liver tests, dilated CBD  MEDICATIONS:   Benadryl 50 mg IV, Fentanyl 150 mcg IV, Versed 12 mg IV TOPICAL ANESTHETIC:  Cetacaine Spray  DESCRIPTION OF PROCEDURE:   After the risks benefits and alternatives of the procedure were thoroughly explained, informed consent was obtained.  The Pentax Radial EUS [id number] endoscope was introduced through the mouth and advanced to the second portion of the duodenum.  This confirmed at least one CBD small stone in 8mm dilated CBD.  The duodenoscope was then introduced and advanced to the major papilla. The major papilla was normal. A 44 Autotome over a 0.35hydrawire was used to cannulate the bile duct. Dye was injected. Cholangiogram showed patent cystic duct, GB full of stones, slightly dilated CBD (up to 9mm) and a single round mobile filling defect that was 4mm across. An adequate biliary sphincterotomy was performed and then the bile duct was swept several times with a biliary sweeping balloon. A single, whitish, multifaceted, 4mm stone was delivered into the duodenum without purulence or debris.  A completion occlusion cholangiogram showed no further filling defects.  The pancreatic duct was never cannulated or inected with dye.  <<PROCEDUREIMAGES>>  Impression: Cholelithiasis, choledocholitiasis. CBD stone removed with biliary sphincterotomy and balloon sweeping.  She will stay overnight and hopefully undergo cholecystecomy tomorrow.  Clears today and then NPO after midnight.  ______________________________ Rachael Fee, MD  n. eSIGNED:   Rachael Fee at 01/08/2011  02:07 PM  Clare Charon, [phone]

## 2011-01-08 NOTE — Interval H&P Note (Signed)
History and Physical Interval Note:  01/08/2011 12:27 PM  Catherine Schroeder  has presented today for surgery, with the diagnosis of poss. cbd stones  The various methods of treatment have been discussed with the patient and family. After consideration of risks, benefits and other options for treatment, the patient has consented to  Procedure(s): UPPER ENDOSCOPIC ULTRASOUND (EUS) RADIAL ENDOSCOPIC RETROGRADE CHOLANGIOPANCREATOGRAPHY (ERCP) as a surgical intervention .  The patients' history has been reviewed, patient examined, no change in status, stable for surgery.  I have reviewed the patients' chart and labs.  Questions were answered to the patient's satisfaction.     Rob Bunting

## 2011-01-08 NOTE — ED Notes (Signed)
Pt presented to the ER with c/o RUQ abd pain, states that pain radiated to the right back side, in the same level as the abd pain, pt states s/s sudden and noted around 0200, pt denies any other difficulty, no issues with urination or BM, however states that when inhaling pain increased to the right side, 9/10

## 2011-01-08 NOTE — ED Notes (Signed)
Report received from night RN. First contact with patient. Pt is alert, and oriented, rate pain 9/10. Husband at bedside.

## 2011-01-08 NOTE — ED Notes (Signed)
WUJ:[id number]<JY> Expected date:01/08/11<BR> Expected time:<BR> Means of arrival:<BR> Comments:<BR> Patient to return from ENDO

## 2011-01-08 NOTE — Consult Note (Signed)
Reason for Consult:Cholelithiasis Referring Physician: Markiya Keefe is an 25 y.o. female.  HPI: Patient's a 25 year old African American who presents with onset of acute right upper quadrant pain around 2 AM this morning. She's never had this problem before. She is 7 weeks postpartum. Workup in the emergency room revealed a normal white count 7.7 and 7.9. Electrolytes are normal BUN is 13 creatinine is 0.67 total bilirubin is 1.6 alkaline phosphatase 238 AST she is 596 ALT is 750. Abdominal ultrasound shows layering of sludge and multiple small short gallstones up to 8 mm in diameter positive Murphy's sonographic sign gallbladder wall thickness is normal at 2 mm. Common bile duct is dilated at 9 mm. There is also intrahepatic biliary ductal dilatation with no focal liver lesions. IVC spleen liver kidneys and aorta were normal. There are impression patient had acute biliary obstruction. She's been evaluated by Dr. Roosvelt Harps the gastroenterology service and is being admitted for ERCP. We will totally plan to followup and schedule her for cholecystectomy tomorrow.  Past Medical History  Diagnosis Date  . Hypothyroidism   . GERD (gastroesophageal reflux disease)   . Hypothyroidism     History reviewed. No pertinent past surgical history.  History reviewed. No pertinent family history.  Social History:  reports that she has never smoked. She has never used smokeless tobacco. She reports that she does not drink alcohol or use illicit drugs.  Allergies: No Known Allergies  Medications:  Prior to Admission:  Prescriptions prior to admission  Medication Sig Dispense Refill  . butalbital-acetaminophen-caffeine (FIORICET, ESGIC) 50-325-40 MG per tablet Take 1 tablet by mouth every 6 (six) hours as needed. headache       . levothyroxine (SYNTHROID, LEVOTHROID) 25 MCG tablet Take 25 mcg by mouth daily.       . prenatal vitamin w/FE, FA (PRENATAL 1 + 1) 27-1 MG TABS Take 1 tablet by mouth  daily.       . ranitidine (ZANTAC) 150 MG tablet Take 1 tablet (150 mg total) by mouth 2 (two) times daily.  60 tablet  1   Scheduled:   . ampicillin-sulbactam (UNASYN) IV  1.5 g Intravenous Once  . docusate sodium  100 mg Oral BID  .  HYDROmorphone (DILAUDID) injection  1 mg Intravenous Once  . levothyroxine  25 mcg Oral QAC breakfast  . ondansetron (ZOFRAN) IV  4 mg Intravenous Once   Continuous:   . dextrose 5 % and 0.45 % NaCl with KCl 20 mEq/L 100 mL (01/08/11 1013)    Results for orders placed during the hospital encounter of 01/08/11 (from the past 48 hour(s))  URINALYSIS, ROUTINE W REFLEX MICROSCOPIC     Status: Normal   Collection Time   01/08/11  5:55 AM      Component Value Range Comment   Color, Urine YELLOW  YELLOW     APPearance CLEAR  CLEAR     Specific Gravity, Urine 1.023  1.005 - 1.030     pH 7.0  5.0 - 8.0     Glucose, UA NEGATIVE  NEGATIVE (mg/dL)    Hgb urine dipstick NEGATIVE  NEGATIVE     Bilirubin Urine NEGATIVE  NEGATIVE     Ketones, ur NEGATIVE  NEGATIVE (mg/dL)    Protein, ur NEGATIVE  NEGATIVE (mg/dL)    Urobilinogen, UA 1.0  0.0 - 1.0 (mg/dL)    Nitrite NEGATIVE  NEGATIVE     Leukocytes, UA NEGATIVE  NEGATIVE  MICROSCOPIC NOT DONE ON URINES WITH NEGATIVE  PROTEIN, BLOOD, LEUKOCYTES, NITRITE, OR GLUCOSE <1000 mg/dL.  POCT PREGNANCY, URINE     Status: Normal   Collection Time   01/08/11  6:07 AM      Component Value Range Comment   Preg Test, Ur NEGATIVE     CBC     Status: Normal   Collection Time   01/08/11  6:45 AM      Component Value Range Comment   WBC 7.7  4.0 - 10.5 (K/uL)    RBC 4.37  3.87 - 5.11 (MIL/uL)    Hemoglobin 13.7  12.0 - 15.0 (g/dL)    HCT 40.9  81.1 - 91.4 (%)    MCV 89.9  78.0 - 100.0 (fL)    MCH 31.4  26.0 - 34.0 (pg)    MCHC 34.9  30.0 - 36.0 (g/dL)    RDW 78.2  95.6 - 21.3 (%)    Platelets 282  150 - 400 (K/uL)   DIFFERENTIAL     Status: Normal   Collection Time   01/08/11  6:45 AM      Component Value Range  Comment   Neutrophils Relative 63  43 - 77 (%)    Neutro Abs 4.9  1.7 - 7.7 (K/uL)    Lymphocytes Relative 29  12 - 46 (%)    Lymphs Abs 2.2  0.7 - 4.0 (K/uL)    Monocytes Relative 7  3 - 12 (%)    Monocytes Absolute 0.5  0.1 - 1.0 (K/uL)    Eosinophils Relative 1  0 - 5 (%)    Eosinophils Absolute 0.1  0.0 - 0.7 (K/uL)    Basophils Relative 0  0 - 1 (%)    Basophils Absolute 0.0  0.0 - 0.1 (K/uL)   COMPREHENSIVE METABOLIC PANEL     Status: Abnormal   Collection Time   01/08/11  6:45 AM      Component Value Range Comment   Sodium 138  135 - 145 (mEq/L)    Potassium 3.5  3.5 - 5.1 (mEq/L)    Chloride 103  96 - 112 (mEq/L)    CO2 25  19 - 32 (mEq/L)    Glucose, Bld 99  70 - 99 (mg/dL)    BUN 13  6 - 23 (mg/dL)    Creatinine, Ser 0.86  0.50 - 1.10 (mg/dL)    Calcium 57.8  8.4 - 10.5 (mg/dL)    Total Protein 7.7  6.0 - 8.3 (g/dL)    Albumin 3.9  3.5 - 5.2 (g/dL)    AST 469 (*) 0 - 37 (U/L)    ALT 750 (*) 0 - 35 (U/L)    Alkaline Phosphatase 238 (*) 39 - 117 (U/L)    Total Bilirubin 1.6 (*) 0.3 - 1.2 (mg/dL)    GFR calc non Af Amer >90  >90 (mL/min)    GFR calc Af Amer >90  >90 (mL/min)   LIPASE, BLOOD     Status: Normal   Collection Time   01/08/11  6:45 AM      Component Value Range Comment   Lipase 59  11 - 59 (U/L)   CBC     Status: Normal   Collection Time   01/08/11 10:20 AM      Component Value Range Comment   WBC 7.9  4.0 - 10.5 (K/uL)    RBC 4.61  3.87 - 5.11 (MIL/uL)    Hemoglobin 14.2  12.0 - 15.0 (g/dL)    HCT 62.9  52.8 - 41.3 (%)  MCV 90.9  78.0 - 100.0 (fL)    MCH 30.8  26.0 - 34.0 (pg)    MCHC 33.9  30.0 - 36.0 (g/dL)    RDW 16.1  09.6 - 04.5 (%)    Platelets 296  150 - 400 (K/uL)   PROTIME-INR     Status: Normal   Collection Time   01/08/11 10:20 AM      Component Value Range Comment   Prothrombin Time 12.9  11.6 - 15.2 (seconds)    INR 0.95  0.00 - 1.49      US Abdomen Complete  01/08/2011  *RADIOLOGY REPORT*  Clinical Data:  25 year old  female with right upper quadrant pain. Back pain.  The patient is 7 weeks postpartum.  COMPLETE ABDOMINAL ULTRASOUND  Comparison:  None.  Findings:  Gallbladder:  Layering sludge.  Multiple small shadowing gallstones (individually perhaps up to seven or 8 mm in diameter).  Positive sonographic Murphy's sign.  Gallbladder wall thickness remains normal at 2 mm.  Common bile duct:  Dilated at 9 mm diameter.  Liver:  Positive for intrahepatic biliary ductal dilatation.  No focal liver lesion.  IVC:  Appears normal.  Pancreas:  Echotexture at the lower limits of normal, otherwise negative.  Spleen:  Normal measuring 5.4 cm in length.  Right Kidney:  Normal measuring 10.8 cm in length.  Left Kidney:  Normal measuring 11.2 cm in length.  Abdominal aorta:  No aneurysm identified.  IMPRESSION: 1. Acute biliary obstruction.  Given multiple small gallstones favor choledocholithiasis. 2.  Positive sonographic Murphy's sign with cholelithiasis and gallbladder sludge.  Gallbladder wall thickness is normal at this time.  Study discussed by telephone with Dr. Margarita Grizzle on 01/08/2011 at 0844 hours.  Original Report Authenticated By: Harley Hallmark, M.D.    Review of Systems  Constitutional: Negative.   Eyes: Negative.   Respiratory: Negative.   Cardiovascular: Negative.   Gastrointestinal: Positive for vomiting and abdominal pain. Negative for nausea.  Genitourinary: Negative.   Musculoskeletal: Negative.   Skin: Negative.   Neurological: Negative.  Headaches: frequent.  Endo/Heme/Allergies: Negative.   Psychiatric/Behavioral: Negative.    Blood pressure 121/77, pulse 90, temperature 97.3 F (36.3 C), temperature source Oral, resp. rate 11, height 5\' 4"  (1.626 m), weight 77.111 kg (170 lb), SpO2 99.00%, currently breastfeeding. Physical Exam  Constitutional: She is oriented to person, place, and time. She appears well-developed and well-nourished. No distress.  HENT:  Head: Normocephalic and atraumatic.    Eyes: Conjunctivae and EOM are normal. Pupils are equal, round, and reactive to light.  Neck: Normal range of motion. Neck supple. No JVD present. No tracheal deviation present. No thyromegaly present.  Cardiovascular: Normal rate, regular rhythm, normal heart sounds and intact distal pulses.   Respiratory: Effort normal and breath sounds normal.  GI: Soft. Bowel sounds are normal. There is tenderness (tender and pain, RUQ on admit.  None currently).  Musculoskeletal: Normal range of motion.  Lymphadenopathy:    She has no cervical adenopathy.  Neurological: She is alert and oriented to person, place, and time. She has normal reflexes.  Skin: Skin is warm and dry. She is not diaphoretic.  Psychiatric: She has a normal mood and affect. Her behavior is normal. Judgment and thought content normal.    Assessment/Plan: 1.Cholelithiasis, choledocholithiasis. 2. History of headaches. 3. Hypothyroid on supplements. 4. 7 weeks postpartum. Plan: If the patient has a successful ERCP, is doing well we'll tentatively schedule for elective cholecystectomy in the a.m.Zola Button physician assistant  for Dr. Darnell Level.  Raymon Schlarb 01/08/2011, 12:45 PM

## 2011-01-08 NOTE — ED Provider Notes (Signed)
History     CSN: [id number]  Arrival date & time 01/08/11  2956   First MD Initiated Contact with Patient 01/08/11 0703      Chief Complaint  Patient presents with  . Abdominal Pain  . Back Pain    (Consider location/radiation/quality/duration/timing/severity/associated sxs/prior treatment) HPI Pain ruq to right upper back began 0200 constant in nature, deep pain continues 9/10.  Patient at first thought acid reflux and took zantac and ibuprofen without relief.  Patient is postpartum 7w 2d.  No problems with pregnancy or delivery.  Patient is breastfeeding.  She is using condoms for bcp.   Past Medical History  Diagnosis Date  . Hypothyroidism     History reviewed. No pertinent past surgical history.  History reviewed. No pertinent family history.  History  Substance Use Topics  . Smoking status: Never Smoker   . Smokeless tobacco: Not on file  . Alcohol Use: No    OB History    Grav Para Term Preterm Abortions TAB SAB Ect Mult Living   1 1 1       1       Review of Systems  All other systems reviewed and are negative.    Allergies  Review of patient's allergies indicates no known allergies.  Home Medications   Current Outpatient Rx  Name Route Sig Dispense Refill  . BUTALBITAL-APAP-CAFFEINE 50-325-40 MG PO TABS Oral Take 1 tablet by mouth every 6 (six) hours as needed. headache     . LEVOTHYROXINE SODIUM 25 MCG PO TABS Oral Take 25 mcg by mouth daily.     Marland Kitchen PRENATAL PLUS 27-1 MG PO TABS Oral Take 1 tablet by mouth daily.     Marland Kitchen RANITIDINE HCL 150 MG PO TABS Oral Take 1 tablet (150 mg total) by mouth 2 (two) times daily. 60 tablet 1    BP 127/90  Pulse 87  Temp(Src) 97.5 F (36.4 C) (Oral)  Resp 16  SpO2 99%  Breastfeeding? Yes  Physical Exam  Nursing note and vitals reviewed. Constitutional: She is oriented to person, place, and time. She appears well-developed and well-nourished.  HENT:  Head: Normocephalic and atraumatic.  Right Ear: External  ear normal.  Left Ear: External ear normal.  Eyes: Conjunctivae are normal. Pupils are equal, round, and reactive to light.  Neck: Normal range of motion.  Cardiovascular: Normal rate and regular rhythm.   Pulmonary/Chest: Effort normal and breath sounds normal.  Abdominal: Soft. Normal appearance. There is tenderness in the right upper quadrant. There is positive Murphy's sign. There is no rigidity, no guarding and no tenderness at McBurney's point.  Musculoskeletal: Normal range of motion.  Neurological: She is alert and oriented to person, place, and time.  Skin: Skin is warm and dry.  Psychiatric: She has a normal mood and affect.    ED Course  Procedures (including critical care time)   Labs Reviewed  URINALYSIS, ROUTINE W REFLEX MICROSCOPIC  POCT PREGNANCY, URINE  CBC  DIFFERENTIAL  POCT PREGNANCY, URINE  COMPREHENSIVE METABOLIC PANEL  LIPASE, BLOOD   No results found.   No diagnosis found.    MDM  Radiologist called report.  GI paged.       Results for orders placed during the hospital encounter of 01/08/11  URINALYSIS, ROUTINE W REFLEX MICROSCOPIC      Component Value Range   Color, Urine YELLOW  YELLOW    APPearance CLEAR  CLEAR    Specific Gravity, Urine 1.023  1.005 - 1.030  pH 7.0  5.0 - 8.0    Glucose, UA NEGATIVE  NEGATIVE (mg/dL)   Hgb urine dipstick NEGATIVE  NEGATIVE    Bilirubin Urine NEGATIVE  NEGATIVE    Ketones, ur NEGATIVE  NEGATIVE (mg/dL)   Protein, ur NEGATIVE  NEGATIVE (mg/dL)   Urobilinogen, UA 1.0  0.0 - 1.0 (mg/dL)   Nitrite NEGATIVE  NEGATIVE    Leukocytes, UA NEGATIVE  NEGATIVE   POCT PREGNANCY, URINE      Component Value Range   Preg Test, Ur NEGATIVE    CBC      Component Value Range   WBC 7.7  4.0 - 10.5 (K/uL)   RBC 4.37  3.87 - 5.11 (MIL/uL)   Hemoglobin 13.7  12.0 - 15.0 (g/dL)   HCT 16.1  09.6 - 04.5 (%)   MCV 89.9  78.0 - 100.0 (fL)   MCH 31.4  26.0 - 34.0 (pg)   MCHC 34.9  30.0 - 36.0 (g/dL)   RDW 40.9  81.1 -  91.4 (%)   Platelets 282  150 - 400 (K/uL)  DIFFERENTIAL      Component Value Range   Neutrophils Relative 63  43 - 77 (%)   Neutro Abs 4.9  1.7 - 7.7 (K/uL)   Lymphocytes Relative 29  12 - 46 (%)   Lymphs Abs 2.2  0.7 - 4.0 (K/uL)   Monocytes Relative 7  3 - 12 (%)   Monocytes Absolute 0.5  0.1 - 1.0 (K/uL)   Eosinophils Relative 1  0 - 5 (%)   Eosinophils Absolute 0.1  0.0 - 0.7 (K/uL)   Basophils Relative 0  0 - 1 (%)   Basophils Absolute 0.0  0.0 - 0.1 (K/uL)  COMPREHENSIVE METABOLIC PANEL      Component Value Range   Sodium 138  135 - 145 (mEq/L)   Potassium 3.5  3.5 - 5.1 (mEq/L)   Chloride 103  96 - 112 (mEq/L)   CO2 25  19 - 32 (mEq/L)   Glucose, Bld 99  70 - 99 (mg/dL)   BUN 13  6 - 23 (mg/dL)   Creatinine, Ser 7.82  0.50 - 1.10 (mg/dL)   Calcium 95.6  8.4 - 10.5 (mg/dL)   Total Protein 7.7  6.0 - 8.3 (g/dL)   Albumin 3.9  3.5 - 5.2 (g/dL)   AST 213 (*) 0 - 37 (U/L)   ALT 750 (*) 0 - 35 (U/L)   Alkaline Phosphatase 238 (*) 39 - 117 (U/L)   Total Bilirubin 1.6 (*) 0.3 - 1.2 (mg/dL)   GFR calc non Af Amer >90  >90 (mL/min)   GFR calc Af Amer >90  >90 (mL/min)  LIPASE, BLOOD      Component Value Range   Lipase 59  11 - 59 (U/L)   US Abdomen Complete  01/08/2011  *RADIOLOGY REPORT*  Clinical Data:  25 year old female with right upper quadrant pain. Back pain.  The patient is 7 weeks postpartum.  COMPLETE ABDOMINAL ULTRASOUND  Comparison:  None.  Findings:  Gallbladder:  Layering sludge.  Multiple small shadowing gallstones (individually perhaps up to seven or 8 mm in diameter).  Positive sonographic Murphy's sign.  Gallbladder wall thickness remains normal at 2 mm.  Common bile duct:  Dilated at 9 mm diameter.  Liver:  Positive for intrahepatic biliary ductal dilatation.  No focal liver lesion.  IVC:  Appears normal.  Pancreas:  Echotexture at the lower limits of normal, otherwise negative.  Spleen:  Normal measuring 5.4  cm in length.  Right Kidney:  Normal measuring 10.8  cm in length.  Left Kidney:  Normal measuring 11.2 cm in length.  Abdominal aorta:  No aneurysm identified.  IMPRESSION: 1. Acute biliary obstruction.  Given multiple small gallstones favor choledocholithiasis. 2.  Positive sonographic Murphy's sign with cholelithiasis and gallbladder sludge.  Gallbladder wall thickness is normal at this time.  Study discussed by telephone with Dr. Margarita Grizzle on 01/08/2011 at 0844 hours.  Original Report Authenticated By: Harley Hallmark, M.D.    Discussed with Dr. Christella Hartigan who asks that general surgery be consulted.  He will admit for ercp.    Hilario Quarry, MD 01/08/11 [phone]

## 2011-01-08 NOTE — ED Notes (Signed)
US at bedside

## 2011-01-09 ENCOUNTER — Encounter (HOSPITAL_COMMUNITY): Payer: Self-pay | Admitting: Anesthesiology

## 2011-01-09 ENCOUNTER — Inpatient Hospital Stay (HOSPITAL_COMMUNITY): Payer: Medicaid Other | Admitting: Anesthesiology

## 2011-01-09 ENCOUNTER — Other Ambulatory Visit (INDEPENDENT_AMBULATORY_CARE_PROVIDER_SITE_OTHER): Payer: Self-pay | Admitting: General Surgery

## 2011-01-09 ENCOUNTER — Inpatient Hospital Stay (HOSPITAL_COMMUNITY): Payer: Medicaid Other

## 2011-01-09 ENCOUNTER — Encounter (HOSPITAL_COMMUNITY)
Admission: EM | Disposition: A | Discharge status: Home / Self Care | Payer: Self-pay | Source: Home / Self Care | Attending: Gastroenterology

## 2011-01-09 HISTORY — PX: CHOLECYSTECTOMY: SHX55

## 2011-01-09 LAB — COMPREHENSIVE METABOLIC PANEL
Alkaline Phosphatase: 203 U/L — ABNORMAL HIGH (ref 39–117)
BUN: 10 mg/dL (ref 6–23)
Chloride: 105 mEq/L (ref 96–112)
Creatinine, Ser: 0.65 mg/dL (ref 0.50–1.10)
GFR calc Af Amer: >90 mL/min (ref >90)
GFR calc non Af Amer: >90 mL/min (ref >90)
Glucose, Bld: 84 mg/dL (ref 70–99)
Potassium: 4.6 mEq/L (ref 3.5–5.1)
Total Bilirubin: 0.8 mg/dL (ref 0.3–1.2)

## 2011-01-09 LAB — CBC
HCT: 39.3 % (ref 36.0–46.0)
MCHC: 34.1 g/dL (ref 30.0–36.0)
Platelets: 261 10*3/uL (ref 150–400)
RDW: 14.1 % (ref 11.5–15.5)

## 2011-01-09 SURGERY — LAPAROSCOPIC CHOLECYSTECTOMY WITH INTRAOPERATIVE CHOLANGIOGRAM
Anesthesia: General | Site: Abdomen | Wound class: Contaminated

## 2011-01-09 MED ORDER — MIDAZOLAM HCL 5 MG/5ML IJ SOLN
INTRAMUSCULAR | Status: DC | PRN
  Administered 2011-01-09: 2 mg via INTRAVENOUS

## 2011-01-09 MED ORDER — POTASSIUM CHLORIDE IN NACL 20-0.9 MEQ/L-% IV SOLN
100.0000 mL/h | INTRAVENOUS | Status: DC
  Administered 2011-01-09: 100 mL/h via INTRAVENOUS
  Filled 2011-01-09 (×5): qty 1000

## 2011-01-09 MED ORDER — ONDANSETRON HCL 4 MG/2ML IJ SOLN
INTRAMUSCULAR | Status: DC | PRN
  Administered 2011-01-09: 4 mg via INTRAVENOUS

## 2011-01-09 MED ORDER — IOHEXOL 300 MG/ML  SOLN
INTRAMUSCULAR | Status: DC | PRN
  Administered 2011-01-09: 50 mL

## 2011-01-09 MED ORDER — LEVOTHYROXINE SODIUM 25 MCG PO TABS
25.0000 ug | ORAL_TABLET | Freq: Once | ORAL | Status: AC
  Administered 2011-01-09: 25 ug via ORAL
  Filled 2011-01-09: qty 1

## 2011-01-09 MED ORDER — PROPOFOL 10 MG/ML IV EMUL
INTRAVENOUS | Status: DC | PRN
  Administered 2011-01-09: 180 mg via INTRAVENOUS

## 2011-01-09 MED ORDER — FAMOTIDINE 20 MG PO TABS
20.0000 mg | ORAL_TABLET | Freq: Every day | ORAL | Status: DC
  Filled 2011-01-09 (×3): qty 1

## 2011-01-09 MED ORDER — SODIUM CHLORIDE 0.9 % IV SOLN: Freq: Once | INTRAVENOUS | Status: DC

## 2011-01-09 MED ORDER — METOCLOPRAMIDE HCL 5 MG/ML IJ SOLN
INTRAMUSCULAR | Status: DC | PRN
  Administered 2011-01-09: 10 mg via INTRAVENOUS

## 2011-01-09 MED ORDER — LACTATED RINGERS IV SOLN
INTRAVENOUS | Status: DC
  Administered 2011-01-09: 14:00:00 via INTRAVENOUS

## 2011-01-09 MED ORDER — LACTATED RINGERS IV SOLN
INTRAVENOUS | Status: DC | PRN
  Administered 2011-01-09: 12:00:00 via INTRAVENOUS

## 2011-01-09 MED ORDER — LACTATED RINGERS IR SOLN
Status: DC | PRN
  Administered 2011-01-09: 4000 mL

## 2011-01-09 MED ORDER — ROCURONIUM BROMIDE 100 MG/10ML IV SOLN
INTRAVENOUS | Status: DC | PRN
  Administered 2011-01-09: 35 mg via INTRAVENOUS

## 2011-01-09 MED ORDER — HYDROCODONE-ACETAMINOPHEN 5-325 MG PO TABS
1.0000 | ORAL_TABLET | ORAL | Status: DC | PRN
  Administered 2011-01-09 – 2011-01-10 (×3): 1 via ORAL
  Filled 2011-01-09 (×3): qty 1

## 2011-01-09 MED ORDER — SUCCINYLCHOLINE CHLORIDE 20 MG/ML IJ SOLN
INTRAMUSCULAR | Status: DC | PRN
  Administered 2011-01-09: 100 mg via INTRAVENOUS

## 2011-01-09 MED ORDER — FENTANYL CITRATE 0.05 MG/ML IJ SOLN
INTRAMUSCULAR | Status: DC | PRN
  Administered 2011-01-09 (×2): 50 ug via INTRAVENOUS
  Administered 2011-01-09: 100 ug via INTRAVENOUS
  Administered 2011-01-09: 50 ug via INTRAVENOUS
  Administered 2011-01-09: 100 ug via INTRAVENOUS

## 2011-01-09 MED ORDER — DEXAMETHASONE SODIUM PHOSPHATE 10 MG/ML IJ SOLN
INTRAMUSCULAR | Status: DC | PRN
  Administered 2011-01-09: 10 mg via INTRAVENOUS

## 2011-01-09 MED ORDER — PRENATAL PLUS 27-1 MG PO TABS
1.0000 | ORAL_TABLET | Freq: Every day | ORAL | Status: DC
  Administered 2011-01-09: 1 via ORAL
  Filled 2011-01-09 (×3): qty 1

## 2011-01-09 MED ORDER — 0.9 % SODIUM CHLORIDE (POUR BTL) OPTIME
TOPICAL | Status: DC | PRN
  Administered 2011-01-09: 1000 mL

## 2011-01-09 MED ORDER — BUPIVACAINE-EPINEPHRINE 0.5% -1:200000 IJ SOLN
INTRAMUSCULAR | Status: DC | PRN
  Administered 2011-01-09: 10 mL

## 2011-01-09 MED ORDER — SODIUM CHLORIDE 0.9 % IV SOLN
INTRAVENOUS | Status: DC | PRN
  Administered 2011-01-09: 11:00:00 via INTRAVENOUS

## 2011-01-09 MED ORDER — HYDROMORPHONE HCL PF 1 MG/ML IJ SOLN: 1.0000 mg | INTRAMUSCULAR | Status: DC | PRN

## 2011-01-09 MED ORDER — KCL IN DEXTROSE-NACL 20-5-0.45 MEQ/L-%-% IV SOLN
INTRAVENOUS | Status: DC
  Administered 2011-01-09: 06:00:00 via INTRAVENOUS
  Filled 2011-01-09 (×4): qty 1000

## 2011-01-09 MED ORDER — GLYCOPYRROLATE 0.2 MG/ML IJ SOLN
INTRAMUSCULAR | Status: DC | PRN
  Administered 2011-01-09: .6 mg via INTRAVENOUS

## 2011-01-09 MED ORDER — PROMETHAZINE HCL 25 MG/ML IJ SOLN: 6.2500 mg | INTRAMUSCULAR | Status: DC | PRN

## 2011-01-09 MED ORDER — FENTANYL CITRATE 0.05 MG/ML IJ SOLN
25.0000 ug | INTRAMUSCULAR | Status: DC | PRN
  Administered 2011-01-09 (×3): 50 ug via INTRAVENOUS

## 2011-01-09 MED ORDER — NEOSTIGMINE METHYLSULFATE 1 MG/ML IJ SOLN
INTRAMUSCULAR | Status: DC | PRN
  Administered 2011-01-09: 4 mg via INTRAVENOUS

## 2011-01-09 MED ORDER — PIPERACILLIN-TAZOBACTAM 3.375 G IVPB
3.3750 g | Freq: Three times a day (TID) | INTRAVENOUS | Status: DC
  Administered 2011-01-09 – 2011-01-10 (×2): 3.375 g via INTRAVENOUS
  Filled 2011-01-09 (×6): qty 50

## 2011-01-09 MED ORDER — HEPARIN SODIUM (PORCINE) 5000 UNIT/ML IJ SOLN
5000.0000 [IU] | Freq: Three times a day (TID) | INTRAMUSCULAR | Status: DC
  Filled 2011-01-09 (×3): qty 1

## 2011-01-09 MED ORDER — SODIUM CHLORIDE 0.9 % IV SOLN
1.5000 g | INTRAVENOUS | Status: DC | PRN
  Administered 2011-01-09: 1.5 g via INTRAVENOUS

## 2011-01-09 SURGICAL SUPPLY — 35 items
APPLIER CLIP ROT 10 11.4 M/L (STAPLE) ×2
BENZOIN TINCTURE PRP APPL 2/3 (GAUZE/BANDAGES/DRESSINGS) IMPLANT
CANISTER SUCTION 2500CC (MISCELLANEOUS) ×2 IMPLANT
CLIP APPLIE ROT 10 11.4 M/L (STAPLE) ×1 IMPLANT
CLOTH BEACON ORANGE TIMEOUT ST (SAFETY) ×2 IMPLANT
COVER MAYO STAND STRL (DRAPES) ×2 IMPLANT
DECANTER SPIKE VIAL GLASS SM (MISCELLANEOUS) ×2 IMPLANT
DERMABOND ADVANCED (GAUZE/BANDAGES/DRESSINGS) ×1
DERMABOND ADVANCED .7 DNX12 (GAUZE/BANDAGES/DRESSINGS) ×1 IMPLANT
DRAPE C-ARM 42X72 X-RAY (DRAPES) ×2 IMPLANT
DRAPE LAPAROSCOPIC ABDOMINAL (DRAPES) ×2 IMPLANT
ELECT REM PT RETURN 9FT ADLT (ELECTROSURGICAL) ×2
ELECTRODE REM PT RTRN 9FT ADLT (ELECTROSURGICAL) ×1 IMPLANT
GLOVE BIOGEL PI IND STRL 7.0 (GLOVE) ×1 IMPLANT
GLOVE BIOGEL PI INDICATOR 7.0 (GLOVE) ×1
GLOVE EUDERMIC 7 POWDERFREE (GLOVE) ×2 IMPLANT
GOWN STRL NON-REIN LRG LVL3 (GOWN DISPOSABLE) ×2 IMPLANT
GOWN STRL REIN XL XLG (GOWN DISPOSABLE) ×4 IMPLANT
HEMOSTAT SURGICEL 4X8 (HEMOSTASIS) IMPLANT
IV LACTATED RINGER IRRG 3000ML (IV SOLUTION) ×1
IV LR IRRIG 3000ML ARTHROMATIC (IV SOLUTION) ×1 IMPLANT
KIT BASIN OR (CUSTOM PROCEDURE TRAY) ×2 IMPLANT
NS IRRIG 1000ML POUR BTL (IV SOLUTION) ×2 IMPLANT
POUCH SPECIMEN RETRIEVAL 10MM (ENDOMECHANICALS) ×2 IMPLANT
SET CHOLANGIOGRAPH MIX (MISCELLANEOUS) ×2 IMPLANT
SET IRRIG TUBING LAPAROSCOPIC (IRRIGATION / IRRIGATOR) ×2 IMPLANT
SOLUTION ANTI FOG 6CC (MISCELLANEOUS) ×2 IMPLANT
STRIP CLOSURE SKIN 1/2X4 (GAUZE/BANDAGES/DRESSINGS) IMPLANT
SUT MNCRL AB 4-0 PS2 18 (SUTURE) ×2 IMPLANT
TOWEL OR 17X26 10 PK STRL BLUE (TOWEL DISPOSABLE) ×6 IMPLANT
TRAY LAP CHOLE (CUSTOM PROCEDURE TRAY) ×2 IMPLANT
TROCAR BLADELESS OPT 5 75 (ENDOMECHANICALS) ×2 IMPLANT
TROCAR XCEL BLUNT TIP 100MML (ENDOMECHANICALS) ×2 IMPLANT
TROCAR XCEL NON-BLD 11X100MML (ENDOMECHANICALS) ×2 IMPLANT
TUBING INSUFFLATION 10FT LAP (TUBING) ×2 IMPLANT

## 2011-01-09 NOTE — Anesthesia Procedure Notes (Signed)
Procedure Name: Intubation Date/Time: 01/09/2011 11:19 AM Performed by: [place], Kani Chauvin C Pre-anesthesia Checklist: Patient identified, Emergency Drugs available, Timeout performed, Suction available and Patient being monitored Patient Re-evaluated:Patient Re-evaluated prior to inductionOxygen Delivery Method: Circle System Utilized Preoxygenation: Pre-oxygenation with 100% oxygen Intubation Type: IV induction Ventilation: Mask ventilation without difficulty Laryngoscope Size: Mac and 3 Grade View: Grade I Tube type: Oral Tube size: 7.0 mm Number of attempts: 1 Airway Equipment and Method: stylet Placement Confirmation: ETT inserted through vocal cords under direct vision,  breath sounds checked- equal and bilateral,  positive ETCO2 and CO2 detector Secured at: 21 cm Tube secured with: Tape Dental Injury: Teeth and Oropharynx as per pre-operative assessment

## 2011-01-09 NOTE — Op Note (Signed)
Patient Name:           Catherine Schroeder   Date of Surgery:        01/09/2011  Pre op Diagnosis:      Chronic cholecystitis with cholelithiasis, choledocholithiasis  Post op Diagnosis:    same  Procedure:                 Laparoscopic cholecystectomy with intraoperative cholangiogram  Surgeon:                     Angelia Mould. Derrell Lolling, M.D., FACS  Assistant:                      none  Operative Indications:   This is a 25 year old white female who is 7 weeks postpartum. She presented yesterday with acute right upper quadrant pain. Liver function tests were elevated and ultrasound showed numerous small gallstones and a dilated common bile duct. ERCP was performed yesterday this revealed patent cystic duct with numerous stones in the gallbladder. A single common bile duct stone was identified. Sphincterotomy was performed and the stone was retrieved. The patient has done very well overnight and is now essentially pain-free. She wanted to go ahead with her cholecystectomy during this admission and  is brought to the operating room for that purpose.  Operative Findings:       The gallbladder was chronically inflamed. It was thin-walled. It was discolored. There were some adhesions to it but these were fairly thin. The anatomy the cystic duct,  cystic artery and common bile duct were conventional. The cholangiogram showed a somewhat dilated extrahepatic biliary tree, but otherwise showed normal intrahepatic and extrahepatic biliary anatomy, no filling defect, and no obstruction with good flow of contrast into the duodenum. The rest of the abdominal viscera were normal to inspection.  Procedure in Detail:          Following the induction of general endotracheal anesthesia, the patient's abdomen was prepped and draped in a sterile fashion. Surgical time out was held. 0.5% Marcaine with epinephrine was used as a local infiltration anesthetic.  A curvilinear transverse incision was made at the lower rim of the  umbilicus. The fascia was incised in the midline and the abdominal cavity entered under direct vision. An 11 mm Hassan trocar was inserted and secured with a pursestring suture of 0 Vicryl. Pneumoperitoneum was created. A camera was inserted. The 11 mm trocar was placed in the subxiphoid region and two 5 mm trocars were  placed in the right upper quadrant. The gallbladder fundus was identified, grasped and elevated. Adhesions were taken down off of the lower body and infundibulum the gallbladder. I dissected out the cystic duct and cystic artery. I isolated the anterior branch and the posterior branch of cystic artery separately, secured it with endoclips and divided. The cholangiogram catheter was inserted into the cystic duct and a cholangiogram was obtained. The cholangiogram was normal as described above. The cholangiocatheter was removed, the cholangiogram catheter discarded, the cystic duct secured with multiple  clips and divided. The gallbladder was dissected from its bed with electrocautery placed in a specimen bag and removed. Through the course of the dissection we made a couple of holes the gallbladder and spilled a few stones all of which were  retrieved. After removing the gallbladder we irrigated the abdominal space in the right upper quadrant with about 2 L of saline. We did this until the irrigation fluid was completely clear and  no further stones could be found. The trocars were removed. The pneumoperitoneum was released.. There was no bleeding. The fascia at the umbilicus closed with 0 Vicryl sutures and the skin closed with subcuticular sutures of 4-0 Monocryl and Dermabond. Patient tolerated the procedure well. Taken to recovery room stable. EBL 20 cc. Complications none. Counts correct.     Angelia Mould. Derrell Lolling, M.D., FACS General and Minimally Invasive Surgery Breast and Colorectal Surgery  01/09/2011 12:29 PM

## 2011-01-09 NOTE — Anesthesia Preprocedure Evaluation (Signed)
Anesthesia Evaluation  Patient identified by MRN, date of birth, ID band Patient awake  General Assessment Comment:7 weeks postpartum following normal vaginal delivery. Patient is breastfeeding.  Reviewed: Allergy & Precautions, H&P , NPO status , Patient's Chart, lab work & pertinent test results  History of Anesthesia Complications Negative for: history of anesthetic complications  Airway Mallampati: II TM Distance: >3 FB Neck ROM: full    Dental No notable dental hx. (+) Teeth Intact and Dental Advidsory Given   Pulmonary neg pulmonary ROS,  clear to auscultation  Pulmonary exam normal       Cardiovascular Exercise Tolerance: Good neg cardio ROS regular Normal    Neuro/Psych Negative Neurological ROS  Negative Psych ROS   GI/Hepatic negative GI ROS, Neg liver ROS, GERD-  Medicated,  Endo/Other  Negative Endocrine ROS  Renal/GU negative Renal ROS  Genitourinary negative   Musculoskeletal   Abdominal Normal abdominal exam  (+)   Peds  Hematology negative hematology ROS (+)   Anesthesia Other Findings History of thyroid replacement  Reproductive/Obstetrics negative OB ROS (+) Breast feeding  Patient informed to discard breast milk the next 24hours post surgery                           Anesthesia Physical Anesthesia Plan  ASA: II and Emergent  Anesthesia Plan: General ETT   Post-op Pain Management:    Induction:   Airway Management Planned:   Additional Equipment:   Intra-op Plan:   Post-operative Plan:   Informed Consent: I have reviewed the patients History and Physical, chart, labs and discussed the procedure including the risks, benefits and alternatives for the proposed anesthesia with the patient or authorized representative who has indicated his/her understanding and acceptance.   Dental Advisory Given  Plan Discussed with: CRNA  Anesthesia Plan Comments:          Anesthesia Quick Evaluation

## 2011-01-09 NOTE — Progress Notes (Signed)
System assessment completed, no c/o outright pain in abd, encouraged to call with needs. Patient is aware that she has to be NPO after midnight for surgery.

## 2011-01-09 NOTE — Progress Notes (Signed)
1 Day Post-Op  Subjective: Patient underwent successful ERCP and common duct stone removal yesterday. She has been stable since that time. Minimal discomfort. No vomiting. Stable vital signs.  Morning laboratories been drawn but results are not available yet.  She states that she would like to go ahead and have her gallbladder surgery done today.  Objective: Vital signs in last 24 hours: Temp:  [97.3 F (36.3 C)-98.5 F (36.9 C)] 98.5 F (36.9 C) (12/21 2212) Pulse Rate:  [72-90] 72  (12/21 2212) Resp:  [11-21] 18  (12/21 2212) BP: (94-172)/(57-132) 112/75 mmHg (12/21 2212) SpO2:  [98 %-100 %] 98 % (12/21 2212) Weight:  [170 lb (77.111 kg)] 170 lb (77.111 kg) (12/21 1151)    Intake/Output from previous day: 12/21 0701 - 12/22 0700 In: -  Out: 350 [Urine:350] Intake/Output this shift: Total I/O In: -  Out: 350 [Urine:350]  GI: abdomen is generally soft and nontender. Nondistended. No scars. No hernias. No masses.  Lab Results:   [facility] 01/08/11 1020 01/08/11 0645  WBC 7.9 7.7  HGB 14.2 13.7  HCT 41.9 39.3  PLT 296 282   BMET  Basename 01/08/11 0645  NA 138  K 3.5  CL 103  CO2 25  GLUCOSE 99  BUN 13  CREATININE 0.67  CALCIUM 10.0   PT/INR  Basename 01/08/11 1020  LABPROT 12.9  INR 0.95   ABG No results found for this basename: PHART:2,PCO2:2,PO2:2,HCO3:2 in the last 72 hours  Studies/Results: US Abdomen Complete  01/08/2011  *RADIOLOGY REPORT*  Clinical Data:  25 year old female with right upper quadrant pain. Back pain.  The patient is 7 weeks postpartum.  COMPLETE ABDOMINAL ULTRASOUND  Comparison:  None.  Findings:  Gallbladder:  Layering sludge.  Multiple small shadowing gallstones (individually perhaps up to seven or 8 mm in diameter).  Positive sonographic Murphy's sign.  Gallbladder wall thickness remains normal at 2 mm.  Common bile duct:  Dilated at 9 mm diameter.  Liver:  Positive for intrahepatic biliary ductal dilatation.  No focal liver  lesion.  IVC:  Appears normal.  Pancreas:  Echotexture at the lower limits of normal, otherwise negative.  Spleen:  Normal measuring 5.4 cm in length.  Right Kidney:  Normal measuring 10.8 cm in length.  Left Kidney:  Normal measuring 11.2 cm in length.  Abdominal aorta:  No aneurysm identified.  IMPRESSION: 1. Acute biliary obstruction.  Given multiple small gallstones favor choledocholithiasis. 2.  Positive sonographic Murphy's sign with cholelithiasis and gallbladder sludge.  Gallbladder wall thickness is normal at this time.  Study discussed by telephone with Dr. Margarita Grizzle on 01/08/2011 at 0844 hours.  Original Report Authenticated By: Harley Hallmark, M.D.   Dg Ercp With Sphincterotomy  01/08/2011  *RADIOLOGY REPORT*  Clinical Data: Gallstones with biliary dilatation on ultrasound earlier today.  ERCP  Comparison:  None.  Technique:  Multiple spot images obtained with the fluoroscopic device and submitted for interpretation post-procedure.  ERCP was performed by Dr.  Christella Hartigan.  Findings: Five spot fluoro images are submitted after procedure for review.  First image shows the endoscope in place with a cannula in the common duct.  There is partial opacification of the ducts and gallbladder lumen.  Numerous tiny stones are seen in the lumen the gallbladder.  There is some contrast in the duodenal lumen.  There appears to be a filling defect in the distal duct.  The second and third images show some decompression of the common duct.  Fourth image shows a balloon  in the distal duct with a question tiny filling defect in the proximal extrahepatic common duct.  The final image shows the balloon had been retrieved.  There is a persistent tiny filling defect in the proximal duct.  IMPRESSION: Cholelithiasis with mild biliary distention.  There is an apparent tiny filling defect the proximal duct suggesting choledocolithiasis.  These images were submitted for radiologic interpretation only. Please see the procedural  report for the amount of contrast and the fluoroscopy time utilized.  Original Report Authenticated By: ERIC A. MANSELL, M.D.    Anti-infectives: Anti-infectives     Start     Dose/Rate Route Frequency Ordered Stop   01/08/11 1100   ampicillin-sulbactam (UNASYN) 1.5 g in sodium chloride 0.9 % 50 mL IVPB        1.5 g 100 mL/hr over 30 Minutes Intravenous  Once 01/08/11 1004 01/08/11 1315   01/08/11 1012   sodium chloride 0.9 % with Ampicillin-Sulbactam (UNASYN) ADS Med  Status:  Discontinued     Comments: ZHENG, FENG: cabinet override         01/08/11 1012 01/08/11 1035          Assessment/Plan: s/p Procedure(s): UPPER ENDOSCOPIC ULTRASOUND (EUS) RADIAL ENDOSCOPIC RETROGRADE CHOLANGIOPANCREATOGRAPHY (ERCP)  Chronic cholecystitis with cholelithiasis. We'll proceed with laparoscopic cholecystectomy with cholangiogram today.  Choledocholithiasis, status post successful ERCP, sphincterotomy and stone removal yesterday.  7 weeks postpartum.  I have discussed the indications and details of cholecystectomy with the patient and her husband. Risks and complications have been outlined, including but not limited to bleeding, infection, conversion to open laparotomy, injury to adjacent organs such as the intestine or common bile duct with major reconstructive surgery, cardiac, pulmonary, and thromboembolic problems. She understands these issues. Her questions were answered. She agrees with this plan.  We'll begin IV fluid hydration.  Check lab work.   LOS: 1 day    Ernestene Mention 01/09/2011

## 2011-01-09 NOTE — Anesthesia Postprocedure Evaluation (Signed)
Anesthesia Post Note  Patient: Catherine Schroeder  Procedure(s) Performed:  LAPAROSCOPIC CHOLECYSTECTOMY WITH INTRAOPERATIVE CHOLANGIOGRAM  Anesthesia type: General  Patient location: PACU  Post pain: Pain level controlled  Post assessment: Post-op Vital signs reviewed  Last Vitals:  Filed Vitals:   01/09/11 1403  BP: 108/71  Pulse: 80  Temp: 37.3 C  Resp: 16    Post vital signs: Reviewed  Level of consciousness: sedated  Complications: No apparent anesthesia complications

## 2011-01-09 NOTE — Progress Notes (Signed)
ANTIBIOTIC CONSULT NOTE - INITIAL  Pharmacy Consult for Zosyn Indication: S/p cholescystectomy for cholelithiasis and choledocholithiasis.  No Known Allergies  Patient Measurements: Height: 5\' 4"  (162.6 cm) Weight: 170 lb (77.111 kg) IBW/kg (Calculated) : 54.7    Vital Signs: Temp: 99.1 F (37.3 C) (12/22 1403) Temp src: Oral (12/22 1005) BP: 108/71 mmHg (12/22 1403) Pulse Rate: 80  (12/22 1403) Intake/Output from previous day: 12/21 0701 - 12/22 0700 In: -  Out: 750 [Urine:750] Intake/Output from this shift: Total I/O In: 2000 [I.V.:2000] Out: 225 [Urine:200; Blood:25]  Labs:  [facility] 01/09/11 0902 01/09/11 0410 01/08/11 1020 01/08/11 0645  WBC 6.7 -- 7.9 7.7  HGB 13.4 -- 14.2 13.7  PLT 261 -- 296 282  LABCREA -- -- -- --  CREATININE -- 0.65 -- 0.67   Estimated Creatinine Clearance: 110 ml/min (by C-G formula based on Cr of 0.65). No results found for this basename: VANCOTROUGH:2,VANCOPEAK:2,VANCORANDOM:2,GENTTROUGH:2,GENTPEAK:2,GENTRANDOM:2,TOBRATROUGH:2,TOBRAPEAK:2,TOBRARND:2,AMIKACINPEAK:2,AMIKACINTROU:2,AMIKACIN:2, in the last 72 hours   Microbiology: No results found for this or any previous visit (from the past 720 hour(s)).  Medical History: Past Medical History  Diagnosis Date  . Hypothyroidism   . GERD (gastroesophageal reflux disease)   . Hypothyroidism     Medications:  Scheduled:    . famotidine  20 mg Oral Daily  . heparin  5,000 Units Subcutaneous Q8H  . levothyroxine  25 mcg Oral QAC breakfast  . levothyroxine  25 mcg Oral Once  . piperacillin-tazobactam (ZOSYN)  IV  3.375 g Intravenous Q8H  . prenatal vitamin w/FE, FA  1 tablet Oral Daily  . DISCONTD: sodium chloride   Intravenous Once  . DISCONTD: docusate sodium  100 mg Oral BID   Infusions:    . 0.9 % NaCl with KCl 20 mEq / L 100 mL/hr (01/09/11 1814)  . DISCONTD: dextrose 5 % and 0.45 % NaCl with KCl 20 mEq/L 100 mL (01/08/11 1013)  . DISCONTD: dextrose 5 % and 0.45 % NaCl with  KCl 20 mEq/L 125 mL/hr at 01/09/11 0532  . DISCONTD: lactated ringers 125 mL/hr at 01/09/11 1337   Assessment: 25 s/p laparoscopic cholecystectomy with an IOC- had Unasyn pre-op now ordered Zosyn post-op.  Goal of Therapy:  Treat/Prevent Infection  Plan:  Zosyn 3.375 Gm IV q8h (EI infusion) F/U SCr/Levels as needed.  Lorenza Evangelist 01/09/2011,6:30 PM

## 2011-01-09 NOTE — Progress Notes (Signed)
Subjective: Patient has just returned to the floor after a laparoscopic cholecystectomy with an IOC. Appreciate  Dr. Jacinto Halim help. Patient seems to be resting at present. Getting Norco for pain. Denies any nausea at present. Last BM was 4 days ago.  Subjective: Vital signs in last 24 hours: Temp:  [97 F (36.1 C)-99.1 F (37.3 C)] 99.1 F (37.3 C) (12/22 1403) Pulse Rate:  [67-85] 80  (12/22 1403) Resp:  [16-20] 16  (12/22 1403) BP: (106-130)/(57-80) 108/71 mmHg (12/22 1403) SpO2:  [98 %-100 %] 100 % (12/22 1403)   Intake/Output from previous day: 12/21 0701 - 12/22 0700 In: -  Out: 750 [Urine:750] Intake/Output this shift: Total I/O In: 2000 [I.V.:2000] Out: 225 [Urine:200; Blood:25]  General appearance: alert, appears stated age, no distress and mildly obese Resp: clear to auscultation bilaterally Cardio: regular rate and rhythm, S1, S2 normal, no murmur, click, rub or gallop GI: abnormal findings:  distended and hypoactive bowel sounds Extremities: extremities normal, atraumatic, no cyanosis or edema  Lab Results:  Basename 01/09/11 0902 01/08/11 1020 01/08/11 0645  WBC 6.7 7.9 7.7  HGB 13.4 14.2 13.7  HCT 39.3 41.9 39.3  PLT 261 296 282   BMET  Basename 01/09/11 0410 01/08/11 0645  NA 137 138  K 4.6 3.5  CL 105 103  CO2 22 25  GLUCOSE 84 99  BUN 10 13  CREATININE 0.65 0.67  CALCIUM 9.7 10.0   LFT  Basename 01/09/11 0410  PROT 7.2  ALBUMIN 3.7  AST 209*  ALT 480*  ALKPHOS 203*  BILITOT 0.8  BILIDIR --  IBILI --   PT/INR  Basename 01/08/11 1020  LABPROT 12.9  INR 0.95   Hepatitis Panel No results found for this basename: HEPBSAG,HCVAB,HEPAIGM,HEPBIGM in the last 72 hours C-Diff No results found for this basename: CDIFFTOX:3 in the last 72 hours Fecal Lactopherrin No results found for this basename: FECLLACTOFRN in the last 72 hours  Studies/Results: Dg Cholangiogram Operative  01/09/2011  *RADIOLOGY REPORT*  Clinical Data: Cholelithiasis.   Gallstones.  Cholecystectomy.  INTRAOPERATIVE CHOLANGIOGRAM  Technique: Intraoperative cholangiogram with cinematic series.  Comparison:  01/08/2011.  Findings: Intraoperative cholangiogram is performed.  There is dilation of the common bile duct.  No filling defects are identified.  The cystic duct stump appears patent.  Cholecystectomy clips are present.  Intrahepatic biliary radicles show no focal filling defects. There is normal spill of contrast into the duodenum.  Duodenal folds appear within normal limits.  IMPRESSION: Intraoperative cholangiogram demonstrates no common duct filling defects.  Original Report Authenticated By: Andreas Newport, M.D.   US Abdomen Complete  01/08/2011  *RADIOLOGY REPORT*  Clinical Data:  25 year old female with right upper quadrant pain. Back pain.  The patient is 7 weeks postpartum.  COMPLETE ABDOMINAL ULTRASOUND  Comparison:  None.  Findings:  Gallbladder:  Layering sludge.  Multiple small shadowing gallstones (individually perhaps up to seven or 8 mm in diameter).  Positive sonographic Murphy's sign.  Gallbladder wall thickness remains normal at 2 mm.  Common bile duct:  Dilated at 9 mm diameter.  Liver:  Positive for intrahepatic biliary ductal dilatation.  No focal liver lesion.  IVC:  Appears normal.  Pancreas:  Echotexture at the lower limits of normal, otherwise negative.  Spleen:  Normal measuring 5.4 cm in length.  Right Kidney:  Normal measuring 10.8 cm in length.  Left Kidney:  Normal measuring 11.2 cm in length.  Abdominal aorta:  No aneurysm identified.  IMPRESSION: 1. Acute biliary obstruction.  Given multiple  small gallstones favor choledocholithiasis. 2.  Positive sonographic Murphy's sign with cholelithiasis and gallbladder sludge.  Gallbladder wall thickness is normal at this time.  Study discussed by telephone with Dr. Margarita Grizzle on 01/08/2011 at 0844 hours.  Original Report Authenticated By: Harley Hallmark, M.D.   Dg Ercp With  Sphincterotomy  01/08/2011  *RADIOLOGY REPORT*  Clinical Data: Gallstones with biliary dilatation on ultrasound earlier today.  ERCP  Comparison:  None.  Technique:  Multiple spot images obtained with the fluoroscopic device and submitted for interpretation post-procedure.  ERCP was performed by Dr.  Christella Hartigan.  Findings: Five spot fluoro images are submitted after procedure for review.  First image shows the endoscope in place with a cannula in the common duct.  There is partial opacification of the ducts and gallbladder lumen.  Numerous tiny stones are seen in the lumen the gallbladder.  There is some contrast in the duodenal lumen.  There appears to be a filling defect in the distal duct.  The second and third images show some decompression of the common duct.  Fourth image shows a balloon in the distal duct with a question tiny filling defect in the proximal extrahepatic common duct.  The final image shows the balloon had been retrieved.  There is a persistent tiny filling defect in the proximal duct.  IMPRESSION: Cholelithiasis with mild biliary distention.  There is an apparent tiny filling defect the proximal duct suggesting choledocolithiasis.  These images were submitted for radiologic interpretation only. Please see the procedural report for the amount of contrast and the fluoroscopy time utilized.  Original Report Authenticated By: ERIC A. MANSELL, M.D.    Medications: I have reviewed the patient's current medications.  Assessment/Plan: 1) S/P Laparoscopic cholecystectomy for cholelithiasis and choledocholithiasis. Continue present care. 2) Constipation: Liberal fluid intake with stool softeners as needed. 3) Hypothyroidism: On Levothyroxine. LOS: 1 day   Catherine Schroeder 01/09/2011, 4:01 PM

## 2011-01-09 NOTE — Transfer of Care (Signed)
Immediate Anesthesia Transfer of Care Note  Patient: Catherine Schroeder  Procedure(s) Performed:  LAPAROSCOPIC CHOLECYSTECTOMY WITH INTRAOPERATIVE CHOLANGIOGRAM  Patient Location: PACU  Anesthesia Type: General  Level of Consciousness: awake and alert   Airway & Oxygen Therapy: Patient Spontanous Breathing and Patient connected to face mask oxygen  Post-op Assessment: Report given to PACU RN and Post -op Vital signs reviewed and stable  Post vital signs: Reviewed and stable  Complications: No apparent anesthesia complications

## 2011-01-10 MED ORDER — HYDROCODONE-ACETAMINOPHEN 5-325 MG PO TABS: 1.0000 | ORAL_TABLET | ORAL | Status: AC | PRN

## 2011-01-10 NOTE — Progress Notes (Signed)
Patient received discharge instructions and prescription for pain medicine. Verbalized understanding of instructions, iv site had been previously removed. Received pain medication before leaving unit. Bowel sounds active but no bowel movement at this time. Reinforced discharge instruction on bowel issues and what to take. Patient and husband verbalized understanding. Left unit in wheelchair accompanied by staff and husband.

## 2011-01-10 NOTE — Progress Notes (Signed)
1 Day Post-Op  Subjective: Patient is doing well. She is ambulatory, tolerating diet without nausea, voiding uneventfully, pain is well-controlled. No reported wound problems.  Objective: Vital signs in last 24 hours: Temp:  [97 F (36.1 C)-99.1 F (37.3 C)] 98.5 F (36.9 C) (12/23 0540) Pulse Rate:  [67-99] 76  (12/23 0540) Resp:  [16-20] 18  (12/23 0540) BP: (90-130)/(60-80) 104/69 mmHg (12/23 0540) SpO2:  [96 %-100 %] 99 % (12/23 0540)    Intake/Output from previous day: 12/22 0701 - 12/23 0700 In: 3426.7 [P.O.:240; I.V.:3186.7] Out: 4325 [Urine:4300; Blood:25] Intake/Output this shift: Total I/O In: 1118.3 [I.V.:1118.3] Out: 3000 [Urine:3000]  GI: abdomen is soft and nontender. All the wounds are healing uneventfully. Benign abdominal exam.  Lab Results:  Results for orders placed during the hospital encounter of 01/08/11 (from the past 24 hour(s))  CBC     Status: Normal   Collection Time   01/09/11  9:02 AM      Component Value Range   WBC 6.7  4.0 - 10.5 (K/uL)   RBC 4.31  3.87 - 5.11 (MIL/uL)   Hemoglobin 13.4  12.0 - 15.0 (g/dL)   HCT 47.8  29.5 - 62.1 (%)   MCV 91.2  78.0 - 100.0 (fL)   MCH 31.1  26.0 - 34.0 (pg)   MCHC 34.1  30.0 - 36.0 (g/dL)   RDW 30.8  65.7 - 84.6 (%)   Platelets 261  150 - 400 (K/uL)     Studies/Results: @RISRSLT24 @     . famotidine  20 mg Oral Daily  . heparin  5,000 Units Subcutaneous Q8H  . levothyroxine  25 mcg Oral QAC breakfast  . levothyroxine  25 mcg Oral Once  . piperacillin-tazobactam (ZOSYN)  IV  3.375 g Intravenous Q8H  . prenatal vitamin w/FE, FA  1 tablet Oral Daily  . DISCONTD: sodium chloride   Intravenous Once  . DISCONTD: docusate sodium  100 mg Oral BID     Assessment/Plan: s/p Procedure(s): LAPAROSCOPIC CHOLECYSTECTOMY WITH INTRAOPERATIVE CHOLANGIOGRAM  She is doing well and may be discharged from a surgical standpoint.  Discharge instructions are given. She will followup with me in the office in 3  weeks.  I told her that she could resume breast feeding tomorrow     LOS: 2 days    Tymier Lindholm M 01/10/2011  . .prob

## 2011-01-13 ENCOUNTER — Encounter (HOSPITAL_COMMUNITY): Payer: Self-pay | Admitting: Gastroenterology

## 2011-01-14 ENCOUNTER — Encounter (HOSPITAL_COMMUNITY): Payer: Self-pay | Admitting: General Surgery

## 2011-01-14 ENCOUNTER — Telehealth (INDEPENDENT_AMBULATORY_CARE_PROVIDER_SITE_OTHER): Payer: Self-pay | Admitting: General Surgery

## 2011-02-05 ENCOUNTER — Encounter (INDEPENDENT_AMBULATORY_CARE_PROVIDER_SITE_OTHER): Payer: Medicaid Other | Admitting: General Surgery

## 2011-02-05 ENCOUNTER — Ambulatory Visit (INDEPENDENT_AMBULATORY_CARE_PROVIDER_SITE_OTHER): Payer: Medicaid Other | Admitting: General Surgery

## 2011-02-05 ENCOUNTER — Encounter (INDEPENDENT_AMBULATORY_CARE_PROVIDER_SITE_OTHER): Payer: Self-pay | Admitting: General Surgery

## 2011-02-05 VITALS — BP 124/70 | HR 62 | Temp 97.4°F | Resp 16 | Ht 64.0 in | Wt 170.8 lb

## 2011-02-05 DIAGNOSIS — Z9889 Other specified postprocedural states: Secondary | ICD-10-CM

## 2011-02-05 NOTE — Progress Notes (Signed)
Subjective:     Patient ID: Catherine Schroeder, female   DOB: 1985-12-06, 26 y.o.   MRN: [id number]  HPI This 26 year old African American female presented to the hospital with acute abdominal pain in December. On December 21 she underwent ERCP, sphincterotomy, and retrieval of common bile duct stones. On 12/22/ 2012 she underwent laparoscopic cholecystectomy with cholangiogram. Pathology report showed chronic cholecystitis with cholelithiasis. She was discharged the following day, December 23.  She is recently postpartum. She has been breast-feeding without problems. The baby is doing well. Her appetite is normal. She is voiding uneventfully and having normal bowel movements without diarrhea. Stool color and urine color are normal. No fevers.  Review of Systems     Objective:   Physical Exam The patient looks well. She is alert, in no distress. Her husband and newborn child with her.  Abdomen soft and nontender. All the trocar sites are well healed.   Assessment:     Choledocholithiasis with obstructive jaundice, status post ERCP, sphincterotomy and stone retrieval. Doing well  Chronic cholecystitis with cholelithiasis, uneventful recovery following laparoscopic cholecystectomy with cholangiogram    Plan:     Okay to resume all normal physical activities without restriction. Low-fat diet advised. Return to see me p.r.n.     Angelia Mould. Derrell Lolling, M.D., [facility] General and Minimally invasive Surgery Breast and Colorectal Surgery Office:   [phone] Pager:   [phone]

## 2011-02-05 NOTE — Patient Instructions (Signed)
You have recovered from your gallbladder operation without any problems. I advise a low-fat diet. You may resume all normal physical activities, including sports and exercise, without restriction. Return to see me if you develop any new problems.

## 2011-11-26 ENCOUNTER — Other Ambulatory Visit: Payer: Self-pay | Admitting: Obstetrics and Gynecology

## 2012-01-19 NOTE — L&D Delivery Note (Signed)
Delivery Note At 11:43 AM a viable and healthy female was delivered via  (Presentation: Right Occiput; Anterior ).  APGAR: 8, 9; weight pending.   Placenta status: Intact, Spontaneous.  Cord: 3V   Anesthesia:  Epidural  Episiotomy: none Lacerations: 2nd degree Suture Repair: 3.0 vicryl Est. Blood Loss (mL): 300cc  Mom to postpartum.  Baby to nursery-stable.  Legrande Hao H. 09/05/2012, 12:13 PM

## 2012-03-09 LAB — OB RESULTS CONSOLE GC/CHLAMYDIA: Chlamydia: NEGATIVE

## 2012-03-09 LAB — OB RESULTS CONSOLE RPR: RPR: NONREACTIVE

## 2012-03-09 LAB — OB RESULTS CONSOLE HEPATITIS B SURFACE ANTIGEN: Hepatitis B Surface Ag: NEGATIVE

## 2012-03-09 LAB — OB RESULTS CONSOLE RUBELLA ANTIBODY, IGM: Rubella: IMMUNE

## 2012-09-04 ENCOUNTER — Encounter (HOSPITAL_COMMUNITY): Payer: Self-pay | Admitting: *Deleted

## 2012-09-04 ENCOUNTER — Inpatient Hospital Stay (HOSPITAL_COMMUNITY)
Admission: AD | Admit: 2012-09-04 | Discharge: 2012-09-07 | DRG: 775 | Disposition: A | Discharge status: Home / Self Care | Payer: Medicaid Other | Source: Ambulatory Visit | Attending: Obstetrics & Gynecology | Admitting: Obstetrics & Gynecology

## 2012-09-04 DIAGNOSIS — E079 Disorder of thyroid, unspecified: Secondary | ICD-10-CM | POA: Diagnosis present

## 2012-09-04 DIAGNOSIS — E039 Hypothyroidism, unspecified: Secondary | ICD-10-CM | POA: Diagnosis present

## 2012-09-04 NOTE — MAU Note (Signed)
Pt to room 167.

## 2012-09-04 NOTE — MAU Note (Signed)
Pt G2 P1 at 39.1wks, leaking clear fluid since 2145.  Denies pain or problems with pregnancy.

## 2012-09-05 ENCOUNTER — Encounter (HOSPITAL_COMMUNITY): Payer: Self-pay | Admitting: *Deleted

## 2012-09-05 ENCOUNTER — Encounter (HOSPITAL_COMMUNITY): Payer: Self-pay | Admitting: Anesthesiology

## 2012-09-05 ENCOUNTER — Inpatient Hospital Stay (HOSPITAL_COMMUNITY): Payer: Medicaid Other | Admitting: Anesthesiology

## 2012-09-05 LAB — CBC
HCT: 36.7 % (ref 36.0–46.0)
Hemoglobin: 13 g/dL (ref 12.0–15.0)
MCH: 31.6 pg (ref 26.0–34.0)
MCHC: 35.4 g/dL (ref 30.0–36.0)
MCV: 89.1 fL (ref 78.0–100.0)

## 2012-09-05 MED ORDER — EPHEDRINE 5 MG/ML INJ
10.0000 mg | INTRAVENOUS | Status: DC | PRN
  Filled 2012-09-05: qty 2
  Filled 2012-09-05: qty 4

## 2012-09-05 MED ORDER — ONDANSETRON HCL 4 MG/2ML IJ SOLN: 4.0000 mg | Freq: Four times a day (QID) | INTRAMUSCULAR | Status: DC | PRN

## 2012-09-05 MED ORDER — OXYTOCIN BOLUS FROM INFUSION: 500.0000 mL | INTRAVENOUS | Status: DC

## 2012-09-05 MED ORDER — FENTANYL 2.5 MCG/ML BUPIVACAINE 1/10 % EPIDURAL INFUSION (WH - ANES)
14.0000 mL/h | INTRAMUSCULAR | Status: DC | PRN
  Administered 2012-09-05: 14 mL/h via EPIDURAL
  Filled 2012-09-05: qty 125

## 2012-09-05 MED ORDER — BUTORPHANOL TARTRATE 1 MG/ML IJ SOLN: 1.0000 mg | INTRAMUSCULAR | Status: DC | PRN

## 2012-09-05 MED ORDER — DIPHENHYDRAMINE HCL 25 MG PO CAPS: 25.0000 mg | ORAL_CAPSULE | Freq: Four times a day (QID) | ORAL | Status: DC | PRN

## 2012-09-05 MED ORDER — SIMETHICONE 80 MG PO CHEW: 80.0000 mg | CHEWABLE_TABLET | ORAL | Status: DC | PRN

## 2012-09-05 MED ORDER — OXYCODONE-ACETAMINOPHEN 5-325 MG PO TABS: 1.0000 | ORAL_TABLET | ORAL | Status: DC | PRN

## 2012-09-05 MED ORDER — SENNOSIDES-DOCUSATE SODIUM 8.6-50 MG PO TABS
2.0000 | ORAL_TABLET | Freq: Every day | ORAL | Status: DC
  Administered 2012-09-05 – 2012-09-06 (×2): 2 via ORAL

## 2012-09-05 MED ORDER — DIPHENHYDRAMINE HCL 50 MG/ML IJ SOLN: 12.5000 mg | INTRAMUSCULAR | Status: DC | PRN

## 2012-09-05 MED ORDER — BENZOCAINE-MENTHOL 20-0.5 % EX AERO
1.0000 "application " | INHALATION_SPRAY | CUTANEOUS | Status: DC | PRN
  Administered 2012-09-06: 1 via TOPICAL
  Filled 2012-09-05 (×2): qty 56

## 2012-09-05 MED ORDER — OXYTOCIN 40 UNITS IN LACTATED RINGERS INFUSION - SIMPLE MED
62.5000 mL/h | INTRAVENOUS | Status: DC
  Administered 2012-09-05: 62.5 mL/h via INTRAVENOUS
  Filled 2012-09-05: qty 1000

## 2012-09-05 MED ORDER — METHYLERGONOVINE MALEATE 0.2 MG/ML IJ SOLN: 0.2000 mg | INTRAMUSCULAR | Status: AC | PRN

## 2012-09-05 MED ORDER — PRENATAL MULTIVITAMIN CH
1.0000 | ORAL_TABLET | Freq: Every day | ORAL | Status: DC
  Administered 2012-09-05 – 2012-09-07 (×3): 1 via ORAL
  Filled 2012-09-05 (×3): qty 1

## 2012-09-05 MED ORDER — OXYCODONE-ACETAMINOPHEN 5-325 MG PO TABS
1.0000 | ORAL_TABLET | ORAL | Status: DC | PRN
  Administered 2012-09-06 (×3): 1 via ORAL
  Filled 2012-09-05 (×3): qty 1

## 2012-09-05 MED ORDER — PHENYLEPHRINE 40 MCG/ML (10ML) SYRINGE FOR IV PUSH (FOR BLOOD PRESSURE SUPPORT)
80.0000 ug | PREFILLED_SYRINGE | INTRAVENOUS | Status: DC | PRN
  Filled 2012-09-05: qty 2
  Filled 2012-09-05: qty 5

## 2012-09-05 MED ORDER — EPHEDRINE 5 MG/ML INJ
10.0000 mg | INTRAVENOUS | Status: DC | PRN
  Filled 2012-09-05: qty 2

## 2012-09-05 MED ORDER — ACETAMINOPHEN 325 MG PO TABS: 650.0000 mg | ORAL_TABLET | ORAL | Status: DC | PRN

## 2012-09-05 MED ORDER — ONDANSETRON HCL 4 MG/2ML IJ SOLN: 4.0000 mg | INTRAMUSCULAR | Status: DC | PRN

## 2012-09-05 MED ORDER — LACTATED RINGERS IV SOLN: 500.0000 mL | INTRAVENOUS | Status: DC | PRN

## 2012-09-05 MED ORDER — LACTATED RINGERS IV SOLN
INTRAVENOUS | Status: DC
  Administered 2012-09-05: 07:00:00 via INTRAVENOUS

## 2012-09-05 MED ORDER — PHENYLEPHRINE 40 MCG/ML (10ML) SYRINGE FOR IV PUSH (FOR BLOOD PRESSURE SUPPORT)
80.0000 ug | PREFILLED_SYRINGE | INTRAVENOUS | Status: DC | PRN
  Filled 2012-09-05: qty 2

## 2012-09-05 MED ORDER — LEVOTHYROXINE SODIUM 25 MCG PO TABS
25.0000 ug | ORAL_TABLET | Freq: Every day | ORAL | Status: DC
  Administered 2012-09-06 – 2012-09-07 (×2): 25 ug via ORAL
  Filled 2012-09-05 (×3): qty 1

## 2012-09-05 MED ORDER — LACTATED RINGERS IV SOLN: 500.0000 mL | Freq: Once | INTRAVENOUS | Status: DC

## 2012-09-05 MED ORDER — TERBUTALINE SULFATE 1 MG/ML IJ SOLN: 0.2500 mg | Freq: Once | INTRAMUSCULAR | Status: DC | PRN

## 2012-09-05 MED ORDER — ONDANSETRON HCL 4 MG PO TABS: 4.0000 mg | ORAL_TABLET | ORAL | Status: DC | PRN

## 2012-09-05 MED ORDER — IBUPROFEN 600 MG PO TABS
600.0000 mg | ORAL_TABLET | Freq: Four times a day (QID) | ORAL | Status: DC | PRN
  Administered 2012-09-05: 600 mg via ORAL
  Filled 2012-09-05: qty 1

## 2012-09-05 MED ORDER — OXYTOCIN 40 UNITS IN LACTATED RINGERS INFUSION - SIMPLE MED
1.0000 m[IU]/min | INTRAVENOUS | Status: DC
  Administered 2012-09-05: 2 m[IU]/min via INTRAVENOUS

## 2012-09-05 MED ORDER — CITRIC ACID-SODIUM CITRATE 334-500 MG/5ML PO SOLN: 30.0000 mL | ORAL | Status: DC | PRN

## 2012-09-05 MED ORDER — IBUPROFEN 600 MG PO TABS
600.0000 mg | ORAL_TABLET | Freq: Four times a day (QID) | ORAL | Status: DC
  Administered 2012-09-05 – 2012-09-07 (×8): 600 mg via ORAL
  Filled 2012-09-05 (×8): qty 1

## 2012-09-05 MED ORDER — LIDOCAINE HCL (PF) 1 % IJ SOLN
30.0000 mL | INTRAMUSCULAR | Status: DC | PRN
  Filled 2012-09-05: qty 30

## 2012-09-05 MED ORDER — MISOPROSTOL 200 MCG PO TABS
ORAL_TABLET | ORAL | Status: AC
  Administered 2012-09-05: 800 ug
  Filled 2012-09-05: qty 4

## 2012-09-05 MED ORDER — WITCH HAZEL-GLYCERIN EX PADS: 1.0000 "application " | MEDICATED_PAD | CUTANEOUS | Status: DC | PRN

## 2012-09-05 MED ORDER — ZOLPIDEM TARTRATE 5 MG PO TABS: 5.0000 mg | ORAL_TABLET | Freq: Every evening | ORAL | Status: DC | PRN

## 2012-09-05 MED ORDER — DIBUCAINE 1 % RE OINT: 1.0000 "application " | TOPICAL_OINTMENT | RECTAL | Status: DC | PRN

## 2012-09-05 MED ORDER — LANOLIN HYDROUS EX OINT: TOPICAL_OINTMENT | CUTANEOUS | Status: DC | PRN

## 2012-09-05 MED ORDER — LIDOCAINE HCL (PF) 1 % IJ SOLN
INTRAMUSCULAR | Status: DC | PRN
  Administered 2012-09-05 (×2): 5 mL

## 2012-09-05 MED ORDER — METHYLERGONOVINE MALEATE 0.2 MG PO TABS
0.2000 mg | ORAL_TABLET | ORAL | Status: AC | PRN
  Administered 2012-09-05 – 2012-09-06 (×6): 0.2 mg via ORAL
  Filled 2012-09-05 (×7): qty 1

## 2012-09-05 MED ORDER — TETANUS-DIPHTH-ACELL PERTUSSIS 5-2.5-18.5 LF-MCG/0.5 IM SUSP
0.5000 mL | Freq: Once | INTRAMUSCULAR | Status: AC
  Administered 2012-09-06: 0.5 mL via INTRAMUSCULAR
  Filled 2012-09-05: qty 0.5

## 2012-09-05 MED ORDER — LEVOTHYROXINE SODIUM 25 MCG PO TABS
25.0000 ug | ORAL_TABLET | Freq: Every day | ORAL | Status: DC
  Administered 2012-09-05: 25 ug via ORAL
  Filled 2012-09-05 (×2): qty 1

## 2012-09-05 NOTE — Anesthesia Preprocedure Evaluation (Signed)
Anesthesia Evaluation  Patient identified by MRN, date of birth, ID band Patient awake    Reviewed: Allergy & Precautions, H&P , Patient's Chart, lab work & pertinent test results  Airway Mallampati: II TM Distance: >3 FB Neck ROM: full    Dental no notable dental hx.    Pulmonary neg pulmonary ROS,  breath sounds clear to auscultation  Pulmonary exam normal       Cardiovascular negative cardio ROS  Rhythm:regular Rate:Normal     Neuro/Psych negative neurological ROS  negative psych ROS   GI/Hepatic negative GI ROS, Neg liver ROS,   Endo/Other  negative endocrine ROSHypothyroidism   Renal/GU negative Renal ROS     Musculoskeletal   Abdominal   Peds  Hematology negative hematology ROS (+)   Anesthesia Other Findings   Reproductive/Obstetrics (+) Pregnancy                           Anesthesia Physical Anesthesia Plan  ASA: II  Anesthesia Plan: Epidural   Post-op Pain Management:    Induction:   Airway Management Planned:   Additional Equipment:   Intra-op Plan:   Post-operative Plan:   Informed Consent: I have reviewed the patients History and Physical, chart, labs and discussed the procedure including the risks, benefits and alternatives for the proposed anesthesia with the patient or authorized representative who has indicated his/her understanding and acceptance.     Plan Discussed with:   Anesthesia Plan Comments:         Anesthesia Quick Evaluation

## 2012-09-05 NOTE — H&P (Signed)
27 y.o. G2P1001  Estimated Date of Delivery: 09/10/12 admitted at 39/[redacted] weeks gestation with SROM.  Prenatal Transfer Tool  Maternal Diabetes: No Genetic Screening: Declined Maternal Ultrasounds/Referrals: Normal Fetal Ultrasounds or other Referrals:  None Maternal Substance Abuse:  No Significant Maternal Medications:  Meds include: Syntroid 25 mcg for borderline hypothyroidism Significant Maternal Lab Results: None Other Significant Pregnancy Complications:  None  Afebrile, VSS Heart and Lungs: No active disease Abdomen: soft, gravid, EFW AGA. Cervical exam:  4/80  Impression: Term pregnancy with SROM and early labor  Plan:  Admit for delivery

## 2012-09-05 NOTE — Progress Notes (Signed)
Delivery of live viable female by Dr Tenny Craw. APGARS 8,9

## 2012-09-05 NOTE — Anesthesia Procedure Notes (Signed)
Epidural Patient location during procedure: OB Start time: 09/05/2012 9:17 AM  Staffing Anesthesiologist: Angus Seller., Harrell Gave. Performed by: anesthesiologist   Preanesthetic Checklist Completed: patient identified, site marked, surgical consent, pre-op evaluation, timeout performed, IV checked, risks and benefits discussed and monitors and equipment checked  Epidural Patient position: sitting Prep: site prepped and draped and DuraPrep Patient monitoring: continuous pulse ox and blood pressure Approach: midline Injection technique: LOR air and LOR saline  Needle:  Needle type: Tuohy  Needle gauge: 17 G Needle length: 9 cm and 9 Needle insertion depth: 6 cm Catheter type: closed end flexible Catheter size: 19 Gauge Catheter at skin depth: 12 cm Test dose: negative  Assessment Events: blood not aspirated, injection not painful, no injection resistance, negative IV test and no paresthesia  Additional Notes Patient identified.  Risk benefits discussed including failed block, incomplete pain control, headache, nerve damage, paralysis, blood pressure changes, nausea, vomiting, reactions to medication both toxic or allergic, and postpartum back pain.  Patient expressed understanding and wished to proceed.  All questions were answered.  Sterile technique used throughout procedure and epidural site dressed with sterile barrier dressing. No paresthesia or other complications noted.The patient did not experience any signs of intravascular injection such as tinnitus or metallic taste in mouth nor signs of intrathecal spread such as rapid motor block. Please see nursing notes for vital signs.

## 2012-09-05 NOTE — H&P (Signed)
Catherine Schroeder is a 27 y.o. female presenting for leaking fluid  27 yo G2P1001 @ 39+2 presents for leaking fluid and was confirmed to be spontaneously ruptured. Her cervix was 4-5 cm dilated on admission and was unchanged after 8 hours.  History OB History   Grav Para Term Preterm Abortions TAB SAB Ect Mult Living   2 1 1       1      Past Medical History  Diagnosis Date  . Hypothyroidism   . GERD (gastroesophageal reflux disease)   . Hypothyroidism    Past Surgical History  Procedure Laterality Date  . Eus  01/08/2011    Procedure: UPPER ENDOSCOPIC ULTRASOUND (EUS) RADIAL;  Surgeon: Rob Bunting, MD;  Location: [facility];  Service: Endoscopy;  Laterality: N/A;  Wants to look with radial EUS scope prior to ERCP  . Ercp  01/08/2011    Procedure: ENDOSCOPIC RETROGRADE CHOLANGIOPANCREATOGRAPHY (ERCP);  Surgeon: Rob Bunting, MD;  Location: [facility];  Service: Endoscopy;  Laterality: N/A;  . Cholecystectomy  01/09/2011    Procedure: LAPAROSCOPIC CHOLECYSTECTOMY WITH INTRAOPERATIVE CHOLANGIOGRAM;  Surgeon: Ernestene Mention, MD;  Location: [facility];  Service: General;  Laterality: N/A;   Family History: family history is not on file. Social History:  reports that she has never smoked. She has never used smokeless tobacco. She reports that she does not drink alcohol or use illicit drugs.   Prenatal Transfer Tool  Maternal Diabetes: No Genetic Screening: Normal Maternal Ultrasounds/Referrals: Normal Fetal Ultrasounds or other Referrals:  None Maternal Substance Abuse:  No Significant Maternal Medications:  None Significant Maternal Lab Results:  None Other Comments:  None  ROS  Dilation: 4.5 Effacement (%): 80 Station: -2 Exam by::  (Dr Tenny Craw) Blood pressure 102/61, pulse 85, temperature 98.6 F (37 C), temperature source Oral, resp. rate 18, height 5\' 4"  (1.626 m), weight 90.992 kg (200 lb 9.6 oz). Exam Physical Exam  Prenatal labs: ABO, Rh: O/Positive/-- (02/20  0000) Antibody: Negative (02/20 0000) Rubella: Immune (02/20 0000) RPR: NON REACTIVE (08/19 0005)  HBsAg: Negative (02/20 0000)  HIV: Non-reactive (02/20 0000)  GBS: Negative (07/21 0000)   Assessment/Plan: 1) Admit 2) Place IUPC 3) Start pitocin 4) Epidural on request   Luken Shadowens H. 09/05/2012, 9:54 AM

## 2012-09-05 NOTE — Anesthesia Postprocedure Evaluation (Signed)
Anesthesia Post Note  Patient: @Catherine Schroeder @Catherine Schroeder   Procedure(s) Performed: CLE/C/S  Anesthesia type: Epidural  Patient location: Mother/Baby  Post pain: Pain level controlled  Post assessment: Post-op Vital signs reviewed  Last Vitals: BP 122/79  Pulse 75  Temp(Src) 37.3 C (Oral)  Resp 18  Ht 5\' 4"  (1.626 m)  Wt 200 lb 9.6 oz (90.992 kg)  BMI 34.42 kg/m2  SpO2 97%  Post vital signs: Reviewed  Level of consciousness: awake  Complications: No apparent anesthesia complications

## 2012-09-06 LAB — CBC
HCT: 39 % (ref 36.0–46.0)
Hemoglobin: 13.7 g/dL (ref 12.0–15.0)
MCV: 89 fL (ref 78.0–100.0)
RBC: 4.38 MIL/uL (ref 3.87–5.11)
RDW: 14.3 % (ref 11.5–15.5)
WBC: 19.4 10*3/uL — ABNORMAL HIGH (ref 4.0–10.5)

## 2012-09-06 NOTE — Progress Notes (Signed)
Post Partum Day 1 Subjective: no complaints  Objective: Blood pressure 112/62, pulse 62, temperature 98.4 F (36.9 C), temperature source Oral, resp. rate 17, height 5\' 4"  (1.626 m), weight 90.992 kg (200 lb 9.6 oz), SpO2 99.00%, breastfeeding.  Physical Exam:  General: alert Lochia: appropriate Uterine Fundus: firm   Recent Labs  09/05/12 0005 09/06/12 0550  HGB 13.0 13.7  HCT 36.7 39.0    Assessment/Plan: Plan for discharge tomorrow   LOS: 2 days   Aubre Quincy D 09/06/2012, 9:19 AM

## 2012-09-06 NOTE — Progress Notes (Signed)
UR chart review completed.  

## 2012-09-07 NOTE — Discharge Summary (Addendum)
Obstetric Discharge Summary Reason for Admission: rupture of membranes Prenatal Procedures: none Intrapartum Procedures: spontaneous vaginal delivery Postpartum Procedures:second degree laceration Complications-Operative and Postpartum: none Hemoglobin  Date Value Range Status  09/06/2012 13.7  12.0 - 15.0 g/dL Final     HCT  Date Value Range Status  09/06/2012 39.0  36.0 - 46.0 % Final    Physical Exam:  General: alert and cooperative Lochia: appropriate Uterine Fundus: firm DVT Evaluation: No evidence of DVT seen on physical exam.  Discharge Diagnoses: Term Pregnancy-delivered  Discharge Information: Date: 09/07/2012 Activity: pelvic rest Diet: routine Medications: PNV and Ibuprofen Condition: stable Instructions: refer to practice specific booklet Discharge to: home Follow-up Information   Follow up with Almon Hercules., MD In 4 weeks.   Specialty:  Obstetrics and Gynecology   Contact information:   [address] [place] [address] [phone]       Newborn Data: Live born female  Birth Weight: 8 lb 0.9 oz (3655 g) APGAR: 8, 9  Home with mother.  Philip Aspen 09/07/2012, 9:32 AM

## 2012-11-23 ENCOUNTER — Other Ambulatory Visit: Payer: Self-pay

## 2013-09-11 ENCOUNTER — Emergency Department (HOSPITAL_COMMUNITY)
Admission: EM | Admit: 2013-09-11 | Discharge: 2013-09-11 | Disposition: A | Discharge status: Home / Self Care | Payer: Medicaid Other | Attending: Emergency Medicine | Admitting: Emergency Medicine

## 2013-09-11 ENCOUNTER — Encounter (HOSPITAL_COMMUNITY): Payer: Self-pay | Admitting: Emergency Medicine

## 2013-09-11 DIAGNOSIS — Z8639 Personal history of other endocrine, nutritional and metabolic disease: Secondary | ICD-10-CM | POA: Insufficient documentation

## 2013-09-11 DIAGNOSIS — Z79899 Other long term (current) drug therapy: Secondary | ICD-10-CM | POA: Insufficient documentation

## 2013-09-11 DIAGNOSIS — K439 Ventral hernia without obstruction or gangrene: Secondary | ICD-10-CM | POA: Insufficient documentation

## 2013-09-11 DIAGNOSIS — Z862 Personal history of diseases of the blood and blood-forming organs and certain disorders involving the immune mechanism: Secondary | ICD-10-CM | POA: Insufficient documentation

## 2013-09-11 DIAGNOSIS — R109 Unspecified abdominal pain: Secondary | ICD-10-CM | POA: Insufficient documentation

## 2013-09-11 LAB — URINALYSIS, ROUTINE W REFLEX MICROSCOPIC
Bilirubin Urine: NEGATIVE
Glucose, UA: NEGATIVE mg/dL
Hgb urine dipstick: NEGATIVE
KETONES UR: NEGATIVE mg/dL
LEUKOCYTES UA: NEGATIVE
NITRITE: NEGATIVE
PROTEIN: NEGATIVE mg/dL
Specific Gravity, Urine: 1.014 (ref 1.005–1.030)
Urobilinogen, UA: 0.2 mg/dL (ref 0.0–1.0)
pH: 6.5 (ref 5.0–8.0)

## 2013-09-11 LAB — CBC WITH DIFFERENTIAL/PLATELET
BASOS ABS: 0 10*3/uL (ref 0.0–0.1)
BASOS PCT: 0 % (ref 0–1)
Eosinophils Absolute: 0.1 10*3/uL (ref 0.0–0.7)
Eosinophils Relative: 1 % (ref 0–5)
HCT: 38 % (ref 36.0–46.0)
Hemoglobin: 13 g/dL (ref 12.0–15.0)
Lymphocytes Relative: 39 % (ref 12–46)
Lymphs Abs: 3.2 10*3/uL (ref 0.7–4.0)
MCH: 30.4 pg (ref 26.0–34.0)
MCHC: 34.2 g/dL (ref 30.0–36.0)
MCV: 89 fL (ref 78.0–100.0)
MONO ABS: 0.4 10*3/uL (ref 0.1–1.0)
Monocytes Relative: 5 % (ref 3–12)
NEUTROS ABS: 4.6 10*3/uL (ref 1.7–7.7)
NEUTROS PCT: 55 % (ref 43–77)
Platelets: 271 10*3/uL (ref 150–400)
RBC: 4.27 MIL/uL (ref 3.87–5.11)
RDW: 13.5 % (ref 11.5–15.5)
WBC: 8.3 10*3/uL (ref 4.0–10.5)

## 2013-09-11 LAB — BASIC METABOLIC PANEL
Anion gap: 14 (ref 5–15)
BUN: 12 mg/dL (ref 6–23)
CALCIUM: 9.9 mg/dL (ref 8.4–10.5)
CO2: 21 mEq/L (ref 19–32)
Chloride: 104 mEq/L (ref 96–112)
Creatinine, Ser: 0.8 mg/dL (ref 0.50–1.10)
Glucose, Bld: 105 mg/dL — ABNORMAL HIGH (ref 70–99)
POTASSIUM: 3.8 meq/L (ref 3.7–5.3)
SODIUM: 139 meq/L (ref 137–147)

## 2013-09-11 LAB — I-STAT CG4 LACTIC ACID, ED: Lactic Acid, Venous: 1.01 mmol/L (ref 0.5–2.2)

## 2013-09-11 NOTE — ED Notes (Signed)
Pt c/o abd hernia since she was pregnant with her son, so about year and 7 months.  Pt states that pain has been intermittent but past several weeks been really bad and having diarrhea everyday.

## 2013-09-11 NOTE — Discharge Instructions (Signed)
Hernia A hernia occurs when an internal organ pushes out through a weak spot in the abdominal wall. Hernias most commonly occur in the groin and around the navel. Hernias often can be pushed back into place (reduced). Most hernias tend to get worse over time. Some abdominal hernias can get stuck in the opening (irreducible or incarcerated hernia) and cannot be reduced. An irreducible abdominal hernia which is tightly squeezed into the opening is at risk for impaired blood supply (strangulated hernia). A strangulated hernia is a medical emergency. Because of the risk for an irreducible or strangulated hernia, surgery may be recommended to repair a hernia. CAUSES   Heavy lifting.  Prolonged coughing.  Straining to have a bowel movement.  A cut (incision) made during an abdominal surgery. HOME CARE INSTRUCTIONS   Bed rest is not required. You may continue your normal activities.  Avoid lifting more than 10 pounds (4.5 kg) or straining.  Cough gently. If you are a smoker it is best to stop. Even the best hernia repair can break down with the continual strain of coughing. Even if you do not have your hernia repaired, a cough will continue to aggravate the problem.  Do not wear anything tight over your hernia. Do not try to keep it in with an outside bandage or truss. These can damage abdominal contents if they are trapped within the hernia sac.  Eat a normal diet.  Avoid constipation. Straining over long periods of time will increase hernia size and encourage breakdown of repairs. If you cannot do this with diet alone, stool softeners may be used. SEEK IMMEDIATE MEDICAL CARE IF:   You have a fever.  You develop increasing abdominal pain.  You feel nauseous or vomit.  Your hernia is stuck outside the abdomen, looks discolored, feels hard, or is tender.  You have any changes in your bowel habits or in the hernia that are unusual for you.  You have increased pain or swelling around the  hernia.  You cannot push the hernia back in place by applying gentle pressure while lying down. MAKE SURE YOU:   Understand these instructions.  Will watch your condition.  Will get help right away if you are not doing well or get worse. Document Released: 01/04/2005 Document Revised: 03/29/2011 Document Reviewed: 08/24/2007 [facility] Patient Information 2015 [place], [place]. This information is not intended to replace advice given to you by your health care provider. Make sure you discuss any questions you have with your health care provider.   Emergency Department Resource Guide 1) Find a Doctor and Pay Out of Pocket Although you won't have to find out who is covered by your insurance plan, it is a good idea to ask around and get recommendations. You will then need to call the office and see if the doctor you have chosen will accept you as a new patient and what types of options they offer for patients who are self-pay. Some doctors offer discounts or will set up payment plans for their patients who do not have insurance, but you will need to ask so you aren't surprised when you get to your appointment.  2) Contact Your Local Health Department Not all health departments have doctors that can see patients for sick visits, but many do, so it is worth a call to see if yours does. If you don't know where your local health department is, you can check in your phone book. The CDC also has a tool to help you locate your  state's health department, and many state websites also have listings of all of their local health departments. ° °3) Find a Walk-in Clinic °If your illness is not likely to be very severe or complicated, you may want to try a walk in clinic. These are popping up all over the country in pharmacies, drugstores, and shopping centers. They're usually staffed by nurse practitioners or physician assistants that have been trained to treat common illnesses and complaints. They're usually fairly  quick and inexpensive. However, if you have serious medical issues or chronic medical problems, these are probably not your best option. ° °No Primary Care Doctor: °- Call Health Connect at  832-8000 - they can help you locate a primary care doctor that  accepts your insurance, provides certain services, etc. °- Physician Referral Service- 1-800-533-3463 ° °Chronic Pain Problems: °Organization         Address  Phone   Notes  °[facility]  ([phone]) 297-2271 Patients need to be referred by their primary care doctor.  ° °Medication Assistance: °Organization         Address  Phone   Notes  °[place] [facility] [address] °[place], [place] [address] (336) 641-8030 --Must be a resident of [place] °-- Must have NO insurance coverage whatsoever (no Medicaid/ Medicare, etc.) °-- The pt. MUST have a primary care doctor that directs their care regularly and follows them in the community °  °[facility]  (866) 331-1348   °[employer]  (888) 892-1162   ° °Agencies that provide inexpensive medical care: °Organization         Address  Phone   Notes  °[facility]  (336) 832-8035   °[facility]    (336) 832-7272   °[facility] [address] °[place], [place] [address] (336) 832-4777   °[facility] [address], °[place] (336) 271-4999   °[facility]    (336) 373-0678   °[facility]    (336) 272-1050   °[facility] ° [address], [place] Phone:  (336) 832-4444, Fax:  (336) 832-4440 Hours of Operation:  9 am - 6 pm, M-F.  Also accepts Medicaid/Medicare and self-pay.  °[facility] ° [address], [place] Phone: (336) 832-3150, Fax: (336) [phone]. Hours of Operation:  8:30 am - 5:30 pm, M-F.  Also accepts Medicaid and self-pay.  °[facility] [place] [address], [place] Phone: (336) 878-6027    °[facility] [address], [place], [place] (336)723-1848, Ext. 123 Mondays & Thursdays: 7-9 AM.  First 15 patients are seen on a first come, first serve basis. °  ° °Medicaid-accepting [place] Providers: ° °Organization         Address  Phone   Notes  °[facility] [address], [place] (336) 641-2100 Also accepts self-pay patients.  °[facility] [address], [place] ° (336) 856-9996   °[facility] [address], [place] (336) 288-8857   °[facility] [address] [place] [address], [place] (336) 299-7000   °Veita Bland [address], [place]  ° (336) 373-1557 Only accepts [place] Access Medicaid patients after they have their name applied to their card.  ° °Self-Pay (no insurance) in [place]: ° °Organization           Address  Phone   Notes  Sickle Cell Patients, [facility] [place] [phone]   [facility] [place] [phone]   [facility] [place]  [address] [address], [place] [phone]   [facility]/Dr. Osei-Bonsu  [address], [place] [address], [place] [phone] Phone number for both [place] and [place] locations is the same.  [facility] [address] [place] [phone]   [facility] [address], [place] or [address] ([phone]   [facility] [address] [place] (508)822-1802, phone; [phone], fax Sees patients 1st and 3rd Saturday of every month.  Must not qualify for public or private insurance (i.e. Medicaid, Medicare, [place] Health Choice, Veterans' Benefits)  Household income should be no more than 200% of the poverty level The clinic cannot treat you if you are  pregnant or think you are pregnant  Sexually transmitted diseases are not treated at the clinic.    Dental Care: Organization         Address  Phone  Notes  [facility] [place] ([phone]) [phone] Accepts children up to age 56 who are enrolled in [place] or [place]; pregnant women with a Medicaid card; and children who have applied for Medicaid or [place] Health Choice, but were declined, whose parents can pay a reduced fee at time of service.  [facility]  [address], [place] [phone] Accepts children up to age 30 who are enrolled in [place] or [place]; pregnant women with a Medicaid card; and children who have applied for Medicaid or [place] Health Choice, but were declined, whose parents can pay a reduced fee at time of service.  [facility]  [place] [phone] Patients are seen by appointment only. Walk-ins are not accepted. [place] will see patients 32 years of age and older. Monday - Tuesday (8am-5pm) Most Wednesdays (8:30-5pm) $30 per visit, cash only  [facility]  [address], [facility] [phone] Patients are seen by appointment only. Walk-ins are not accepted. [place] will see patients 61 years of age and older. One Wednesday Evening (Monthly: Volunteer Based).  $30 per visit, cash only  [place]  [phone] for adults; Children under age 24, call Graduate Pediatric Dentistry at [phone]. Children aged 39-14, please call [phone] to request a pediatric application.  Dental services are provided in all areas of dental care including fillings, crowns and bridges, complete and partial dentures, implants, gum treatment, root canals, and extractions. Preventive care is also provided. Treatment is provided to  both adults and children. Patients are selected via a lottery and there is often a waiting list.   [facility] [address], [place]  [phone] [email or web address]   [facility] [address] [place], [place] [phone], Ext. 123 Second and Fourth Thursday of each month, opens at 6:30 AM; Clinic ends at 9 AM.  Patients are seen on a first-come first-served basis, and a limited number are seen during each clinic.   [facility]  [address] [place], [place], [place] ([phone]) [phone]   Eligibility Requirements °You must have lived in [place], [place], or [place] counties for at least the last three months. °  You cannot be eligible for state or federal sponsored healthcare insurance, including Veterans Administration, Medicaid, or Medicare. °  You generally cannot be eligible for healthcare insurance through your employer.  °  How to apply: °Eligibility screenings are held every Tuesday and Wednesday afternoon from 1:00 pm until 4:00 pm. You do not need an appointment for the interview!  °[facility] [address], [place], [place] [phone]   °[place] [facility]  336-342-8273   °[place]  336-703-3100   °[facility]  336-570-6415   ° °Behavioral Health Resources in the Community: °Intensive Outpatient Programs °Organization         Address  Phone  Notes  °[facility] [address], [place], [place] 336-878-6098   °[facility] [address], [place], [place] 336-832-9800   °[facility] [address], [place], [place] ° 336-882-2125   °[facility] [address],  °[place], [place] 1-800-853-5163 or 336-641-4981   °Substance Abuse Resources °Organization         Address  Phone  Notes  °[facility]  336-882-2125   °[facility]  336-784-9470   °[facility]   336-285-9073   °[facility]  336-845-3988   °Residential & Outpatient Substance Abuse Program  1-800-659-3381   °Psychological Services °Organization         Address  Phone  Notes  °[facility]  336- 832-9600   °[facility]  336- 378-7881   °[facility] [address], [place] 1-800-853-5163 or 336-641-4981   ° °Mobile Crisis Teams °Organization         Address  Phone  Notes  °[facility], Mobile Crisis Care Unit  1-877-626-1772   °Assertive °[facility] ° [address] [place], [place] 336-834-9664   °Sharon DeEsch [address] °[place] 336-554-5454   ° °Self-Help/Support Groups °Organization         Address  Phone             Notes  °[facility] - variety of support groups  336- 373-1402 Call for more information  °Narcotics Anonymous (NA), [facility] [address], °[place]  2 meetings at this location  ° °Residential Treatment Programs °Organization         Address  Phone  Notes  °[facility] [address],    °[place]  1-866-801-8205   °[facility] ° [address], [place], [place] [phone]   °[facility] [address], [place] 336-845-3988 Admissions: 8am-3pm M-F  °[facility] [address]    °[place], [place] 336-841-1104   °[facility] [address], [place], [place] 336-379-7146   °[facility] [address]  °[place], [place] 336-285-9073   °[facility] Intensive Outpatient [address], [place], [place] 336-852-3033   °[facility] [address]  °[place], [place] 1-877-615-2722 or 336-784-9470   °[facility] ([facility] [address] [place], [place] 336-227-7417 Accepts Medicaid  °[facility] [address]  °[place]  1-800-659-3381 Substance Abuse/Addiction Treatment  ° °[place] Behavioral Health Resources °Organization           Address  Phone  Notes  °[facility]  (888) 581-9988   °Julie Brannon, PhD [address] [place], [place]   (336) 349-5553 or (336) 951-0000   ° Behavioral   [address] °[place], [place] [place] (336) 349-4454   °[facility] [address], [place], [place] (336) 342-8316 Insurance/Medicaid/sponsorship through [facility]  °[facility] [address]                                    [place], [place] (336) 342-8316 Therapy/tele-psych/case  °[facility] [address]  ° [place], [place] (336) 349-2233    °Dr. Arfeen  (336) 349-4544   °[facility] [place]  [employer] [place] [facility] 1) [address],  °2) [address], [place] °3)  [address], [place] (336) 349-3220 °(336) 342-7768 ° °(336) 342-8140   °[place] Child Abuse Hotline (336) 342-1394 or (336) 342-3537 (After Hours)    ° ° ° ° °

## 2013-09-11 NOTE — ED Provider Notes (Signed)
CSN: [id number]     Arrival date & time 09/11/13  1538 History   First MD Initiated Contact with Patient 09/11/13 1615     Chief Complaint  Patient presents with  . hernia pain    . Hernia   HPI  Patient is a 28 y.o. Female who presents to the ED with hernia.  Patient states that she has had this hernia for the past year and seven months.  Patient states that this hernia started when she was pregnant with her last child.  Patient states that most of the time the hernia is able to be reduced with laying down and gentle pressure.  She is currently pain free.  Hernia does become intermittently painful and the pain is like a sharp cramping pain which will go away eventually.  Patient has not tried any relieving factors for it at this time.  Patient does admit that it bothers her with heavy lifting and picking up her children.  Patient has never been seen and evaluated for it by a doctor.  Patient states that her pain is occasionally associated with diarrhea, hardness of the hernia.  She denies fever, chills, nausea, vomiting, chest pain, shortness of breath, urinary frequency, urgency, dysuria, hematuria, melena, or hematochezia.  All other ROS are negative at this time.  Patient states that her diarrhea episodes are loose stools which happen once or twice every other day.  Patient does not currently have a PCP.    Past Medical History  Diagnosis Date  . Hypothyroidism   . GERD (gastroesophageal reflux disease)   . Hypothyroidism    Past Surgical History  Procedure Laterality Date  . Eus  01/08/2011    Procedure: UPPER ENDOSCOPIC ULTRASOUND (EUS) RADIAL;  Surgeon: Rob Bunting, MD;  Location: [facility];  Service: Endoscopy;  Laterality: N/A;  Wants to look with radial EUS scope prior to ERCP  . Ercp  01/08/2011    Procedure: ENDOSCOPIC RETROGRADE CHOLANGIOPANCREATOGRAPHY (ERCP);  Surgeon: Rob Bunting, MD;  Location: [facility];  Service: Endoscopy;  Laterality: N/A;  . Cholecystectomy   01/09/2011    Procedure: LAPAROSCOPIC CHOLECYSTECTOMY WITH INTRAOPERATIVE CHOLANGIOGRAM;  Surgeon: Ernestene Mention, MD;  Location: [facility];  Service: General;  Laterality: N/A;   No family history on file. History  Substance Use Topics  . Smoking status: Never Smoker   . Smokeless tobacco: Never Used  . Alcohol Use: No   OB History   Grav Para Term Preterm Abortions TAB SAB Ect Mult Living   Review of Systems  See HPI  Allergies  Review of patient's allergies indicates no known allergies.  Home Medications   Prior to Admission medications   Medication Sig Start Date End Date Taking? Authorizing Provider  ibuprofen (ADVIL,MOTRIN) 200 MG tablet Take 200 mg by mouth every 6 (six) hours as needed for headache.   Yes Historical Provider, MD  prenatal vitamin w/FE, FA (PRENATAL 1 + 1) 27-1 MG TABS Take 1 tablet by mouth daily.    Yes Historical Provider, MD   BP 140/71  Pulse 89  Temp(Src) 97.6 F (36.4 C) (Oral)  Resp 17  SpO2 99%  LMP 08/20/2013 Physical Exam  Nursing note and vitals reviewed. Constitutional: She is oriented to person, place, and time. She appears well-developed and well-nourished. No distress.  HENT:  Head: Normocephalic and atraumatic.  Mouth/Throat: Oropharynx is clear and moist. No oropharyngeal exudate.  Eyes: Conjunctivae and EOM  are normal. Pupils are equal, round, and reactive to light. No scleral icterus.  Neck: Normal range of motion. Neck supple. No JVD present. No thyromegaly present.  Cardiovascular: Normal rate, regular rhythm, normal heart sounds and intact distal pulses.  Exam reveals no gallop and no friction rub.   No murmur heard. Pulmonary/Chest: Effort normal and breath sounds normal. No respiratory distress. She has no wheezes. She has no rales. She exhibits no tenderness.  Abdominal: Soft. Bowel sounds are normal. She exhibits no distension and no mass. There is no tenderness. There is no rebound and no guarding.   Ventral supraumbilical hernia which is non-tender to palpation and easily reducible.  Hernia is reproducible with coughing.  There are positive bowel sounds in all four quadrants and hernia.  Musculoskeletal: Normal range of motion.  Lymphadenopathy:    She has no cervical adenopathy.  Neurological: She is alert and oriented to person, place, and time. No cranial nerve deficit. Coordination normal.  Skin: Skin is warm and dry. She is not diaphoretic.  Psychiatric: She has a normal mood and affect. Her behavior is normal. Judgment and thought content normal.    ED Course  Procedures (including critical care time) Labs Review Labs Reviewed  BASIC METABOLIC PANEL - Abnormal; Notable for the following:    Glucose, Bld 105 (*)    All other components within normal limits  URINALYSIS, ROUTINE W REFLEX MICROSCOPIC  CBC WITH DIFFERENTIAL  I-STAT CG4 LACTIC ACID, ED    Imaging Review No results found.   EKG Interpretation None      MDM   Final diagnoses:  Ventral hernia without obstruction or gangrene   Patient is a 28 y.o. Female who presents to the ED with hernia.  Physical exam reveals a non-tender ventral hernia which is easily reducible.  Given diarrhea symptoms basic labs were drawn here without acute abnormalities.  Patient is stable for discharge at this time.  Patient will be given referral to [facility] for further evaluation at this time.  Patient will also be given resource list and referral to the [facility].  Patient was told to return for strangulated and incarcerated hernia symptoms.  She states understanding and agreement at this time.  Patient was discussed with Dr. Manus Gunning who agrees with the above plan and workup.  Patient is stable for discharge at this time.      Eben Burow, PA-C 09/11/13 1756

## 2013-09-12 NOTE — ED Provider Notes (Signed)
Medical screening examination/treatment/procedure(s) were performed by non-physician practitioner and as supervising physician I was immediately available for consultation/collaboration.   EKG Interpretation None       Glynn Octave, MD 09/12/13 [phone]

## 2013-10-04 ENCOUNTER — Encounter (INDEPENDENT_AMBULATORY_CARE_PROVIDER_SITE_OTHER): Payer: Self-pay | Admitting: General Surgery

## 2013-10-10 ENCOUNTER — Encounter (INDEPENDENT_AMBULATORY_CARE_PROVIDER_SITE_OTHER): Payer: Self-pay | Admitting: General Surgery

## 2013-10-10 ENCOUNTER — Other Ambulatory Visit (INDEPENDENT_AMBULATORY_CARE_PROVIDER_SITE_OTHER): Payer: Self-pay | Admitting: General Surgery

## 2013-10-31 ENCOUNTER — Encounter (HOSPITAL_COMMUNITY): Payer: Self-pay | Admitting: Pharmacy Technician

## 2013-11-07 ENCOUNTER — Encounter (HOSPITAL_COMMUNITY): Payer: Self-pay

## 2013-11-08 ENCOUNTER — Encounter (HOSPITAL_COMMUNITY)
Admission: RE | Admit: 2013-11-08 | Discharge: 2013-11-08 | Disposition: A | Discharge status: Home / Self Care | Payer: Medicaid Other | Source: Ambulatory Visit | Attending: General Surgery | Admitting: General Surgery

## 2013-11-08 ENCOUNTER — Encounter (HOSPITAL_COMMUNITY): Payer: Self-pay

## 2013-11-08 DIAGNOSIS — Z01812 Encounter for preprocedural laboratory examination: Secondary | ICD-10-CM | POA: Insufficient documentation

## 2013-11-08 HISTORY — DX: Headache, unspecified: R51.9

## 2013-11-08 HISTORY — DX: Ventral hernia without obstruction or gangrene: K43.9

## 2013-11-08 HISTORY — DX: Headache: R51

## 2013-11-08 LAB — CBC WITH DIFFERENTIAL/PLATELET
BASOS ABS: 0 10*3/uL (ref 0.0–0.1)
BASOS PCT: 0 % (ref 0–1)
EOS ABS: 0.1 10*3/uL (ref 0.0–0.7)
Eosinophils Relative: 1 % (ref 0–5)
HCT: 38.1 % (ref 36.0–46.0)
Hemoglobin: 13 g/dL (ref 12.0–15.0)
LYMPHS ABS: 3.4 10*3/uL (ref 0.7–4.0)
Lymphocytes Relative: 37 % (ref 12–46)
MCH: 30.4 pg (ref 26.0–34.0)
MCHC: 34.1 g/dL (ref 30.0–36.0)
MCV: 89 fL (ref 78.0–100.0)
Monocytes Absolute: 0.4 10*3/uL (ref 0.1–1.0)
Monocytes Relative: 4 % (ref 3–12)
NEUTROS PCT: 58 % (ref 43–77)
Neutro Abs: 5.3 10*3/uL (ref 1.7–7.7)
PLATELETS: 281 10*3/uL (ref 150–400)
RBC: 4.28 MIL/uL (ref 3.87–5.11)
RDW: 13.3 % (ref 11.5–15.5)
WBC: 9.2 10*3/uL (ref 4.0–10.5)

## 2013-11-08 LAB — COMPREHENSIVE METABOLIC PANEL
ALBUMIN: 4 g/dL (ref 3.5–5.2)
ALK PHOS: 87 U/L (ref 39–117)
ALT: 26 U/L (ref 0–35)
AST: 19 U/L (ref 0–37)
Anion gap: 11 (ref 5–15)
BUN: 11 mg/dL (ref 6–23)
CALCIUM: 9.5 mg/dL (ref 8.4–10.5)
CO2: 27 mEq/L (ref 19–32)
Chloride: 105 mEq/L (ref 96–112)
Creatinine, Ser: 0.91 mg/dL (ref 0.50–1.10)
GFR calc Af Amer: >90 mL/min (ref >90)
GFR calc non Af Amer: 86 mL/min — ABNORMAL LOW (ref >90)
Glucose, Bld: 119 mg/dL — ABNORMAL HIGH (ref 70–99)
POTASSIUM: 4.1 meq/L (ref 3.7–5.3)
SODIUM: 143 meq/L (ref 137–147)
TOTAL PROTEIN: 7.5 g/dL (ref 6.0–8.3)
Total Bilirubin: 0.2 mg/dL — ABNORMAL LOW (ref 0.3–1.2)

## 2013-11-08 LAB — HCG, SERUM, QUALITATIVE: Preg, Serum: NEGATIVE

## 2013-11-08 NOTE — Patient Instructions (Signed)
Catherine BeringLisa M Schroeder  11/08/2013                           YOUR PROCEDURE IS SCHEDULED ON:  11/12/13                ENTER FROM [address] - ENTER THRU EMERGENCY ENTRANCE                                           FOLLOW  SIGNS TO SHORT STAY CENTER                 ARRIVE AT SHORT STAY AT: 5:30 AM               CALL THIS NUMBER IF ANY PROBLEMS THE DAY OF SURGERY :               [phone]                                REMEMBER:   Do not eat food or drink liquids AFTER MIDNIGHT               STOP ASPIRIN AND HERBAL MEDS 5 DAYS PREOP                  Take these medicines the morning of surgery with               A SIPS OF WATER :         Do not wear jewelry, make-up   Do not wear lotions, powders, or perfumes.   Do not shave legs or underarms 12 hrs. before surgery (men may shave face)  Do not bring valuables to the hospital.  Contacts, dentures or bridgework may not be worn into surgery.  Leave suitcase in the car. After surgery it may be brought to your room.  For patients admitted to the hospital more than one night, checkout time is            11:00 AM                                                       The day of discharge.   Patients discharged the day of surgery will not be allowed to drive home.            If going home same day of surgery, must have someone stay with you              FIRST 24 hrs at home and arrange for some one to drive you              home from hospital.   ________________________________________________________________________  Portal  Before surgery, you can play an important role.  Because skin is not sterile, your skin needs to be as free of germs as possible.  You can reduce the number of germs on your skin by washing with CHG (chlorahexidine gluconate) soap before surgery.  CHG is an antiseptic cleaner  which kills germs and bonds with the skin to continue killing germs even after washing. Please DO NOT use if you have an allergy to CHG or antibacterial soaps.  If your skin becomes reddened/irritated stop using the CHG and inform your nurse when you arrive at Short Stay. Do not shave (including legs and underarms) for at least 48 hours prior to the first CHG shower.  You may shave your face. Please follow these instructions carefully:   1.  Shower with CHG Soap the night before surgery and the  morning of Surgery.   2.  If you choose to wash your hair, wash your hair first as usual with your  normal  Shampoo.   3.  After you shampoo, rinse your hair and body thoroughly to remove the  shampoo.                                         4.  Use CHG as you would any other liquid soap.  You can apply chg directly  to the skin and wash . Gently wash with scrungie or clean wascloth    5.  Apply the CHG Soap to your body ONLY FROM THE NECK DOWN.   Do not use on open                           Wound or open sores. Avoid contact with eyes, ears mouth and genitals (private parts).                        Genitals (private parts) with your normal soap.              6.  Wash thoroughly, paying special attention to the area where your surgery  will be performed.   7.  Thoroughly rinse your body with warm water from the neck down.   8.  DO NOT shower/wash with your normal soap after using and rinsing off  the CHG Soap .                9.  Pat yourself dry with a clean towel.             10.  Wear clean pajamas.             11.  Place clean sheets on your bed the night of your first shower and do not  sleep with pets.  Day of Surgery : Do not apply any lotions/deodorants the morning of surgery.  Please wear clean clothes to the hospital/surgery center.  FAILURE TO FOLLOW THESE INSTRUCTIONS MAY RESULT IN THE CANCELLATION OF YOUR SURGERY    PATIENT  SIGNATURE_________________________________  ______________________________________________________________________

## 2013-11-10 NOTE — H&P (Signed)
[facility]isa [facility]  [facility] Patient #: [id number] DOB: May 22, 1985 Married / Language: English / Race: Black or African American Female  History of Present Illness  Patient words: hernia.  The patient is a 28 year old female who presents with an incisional hernia. This patient was referred back to me by herself for evaluation of abdominal wall hernias. In 2012 she presented with obstructive jaundice, underwent ERCP and sphincterotomy for common duct stones and I performed laparoscopic cholecystectomy with cholangiogram. She was 2 months postpartum at that time. She recovered uneventfully. She now presents with a almost 2 year history of a painful bulge in the midline above the umbilicus. She was evaluated in the emergency department last month for this. No nausea or vomiting. It just causes pain and a bulge. She does not have a PCP. She has no medical problems. She is here with her husband and 2 small children. She does not work outside the home. Comorbidities include hypothyroidism, GERD, obesity.  After examination, I told her that she had a small incisional hernia at the umbilicus and a larger epigastric hernia in the upper midline. After discussing the techniques of surgery, natural history, and options, she decided she wanted to go ahead and undergo repair. We will plan a laparoscopic repair of multiple abdominal wall hernias with mesh, possible open repair. I discussed the indications, details, techniques, and numerous risks of the surgery with her. She is aware of the risk of bleeding, infection, conversion to open laparotomy, recurrence of the hernia, injury to adjacent organs such as intestine with major reconstructive surgery. She understands all of these issues. All questions are answered. She agrees with this plan.   Other Problems Cholelithiasis Thyroid Disease Ventral Hernia Repair   Gallbladder Surgery - Laparoscopic  Diagnostic Studies History   Colonoscopy never Mammogram never Pap Smear 1-5 years ago  Allergies = No Known Drug Allergies09/22/2015  Medication History  Ibuprofen 200 (200MG  Tablet, Oral) Active. Prenatal + Iron (Oral) Active. Medications Reconciled  Social History Caffeine use Carbonated beverages, Tea. No alcohol use No drug use Tobacco use Never smoker.  Family History Family history unknown First Degree Relatives  Pregnancy / Birth History Sinclair Grooms(Catherine Schroeder, Catherine Schroeder; 10/10/2013 2:07 PM) Age at menarche 14 years. Gravida 2 Maternal age 26-25 Para 2 Regular periods  Vitals  10/10/2013 2:12 PM Weight: 196 lb Height: 64in Body Surface Area: 2 m Body Mass Index: 33.64 kg/m Temp.: 99.25F(Oral)  Pulse: 95 (Regular)  BP: 120/78 (Sitting, Left Arm, Standard)    Physical Exam  General Mental Status-Alert. General Appearance-Consistent with stated age. Build & Nutrition -Note: BMI 33.64.  Hydration-Well hydrated. Voice-Normal.  Head and Neck Head-normocephalic, atraumatic with no lesions or palpable masses. Trachea-midline. Thyroid Gland Characteristics - normal size and consistency.  Eye Eyeball - Bilateral-Extraocular movements intact. Sclera/Conjunctiva - Bilateral-No scleral icterus.  Chest and Lung Exam Chest and lung exam reveals -quiet, even and easy respiratory effort with no use of accessory muscles, normal resonance, no flatness or dullness, non-tender and normal tactile fremitus and on auscultation, normal breath sounds, no adventitious sounds and normal vocal resonance. Inspection Chest Wall - Normal. Back - normal.  Breast Breast - Left-Symmetric, Non Tender, No Biopsy scars, no Dimpling, No Inflammation, No Lumpectomy scars, No Mastectomy scars, No Peau d' Orange. Breast - Right-Symmetric, Non Tender, No Biopsy scars, no Dimpling, No Inflammation, No Lumpectomy scars, No Mastectomy scars, No Peau d' Orange. Breast  Lump-No Palpable Breast Mass.  Cardiovascular Cardiovascular examination reveals -on palpation PMI  is normal in location and amplitude, no palpable S3 or S4. Normal cardiac borders., normal heart sounds, regular rate and rhythm with no murmurs, carotid auscultation reveals no bruits and normal pedal pulses bilaterally.  Abdomen Inspection Inspection of the abdomen reveals - No Hernias. Skin - Scar - no surgical scars. Palpation/Percussion Palpation and Percussion of the abdomen reveal - Soft, Non Tender, No Rebound tenderness, No Rigidity (guarding) and No hepatosplenomegaly. Auscultation Auscultation of the abdomen reveals - Bowel sounds normal. Note: Abdomen shows mild obesity. Well-healed trocar scars below the umbilicus and above where she had her cholecystectomy. She has a small hernia at the umbilicus which is reducible and a little bit tender. She has a larger, lemon-sized epigastric hernia several centimeters above in the midline that is only partially reducible. She has little bit of a diastases recti as well. Otherwise the abdomen is soft and nontender there are no masses. No inguinal hernia.   Peripheral Vascular Upper Extremity Inspection - Bilateral - Normal - No Clubbing, No Cyanosis, No Edema, Pulses Intact. Palpation - Pulses bilaterally normal. Lower Extremity Palpation - Pulses bilaterally normal.  Neurologic Neurologic evaluation reveals -alert and oriented x 3 with no impairment of recent or remote memory. Mental Status-Normal.  Musculoskeletal Normal Exam - Left-Upper Extremity Strength Normal and Lower Extremity Strength Normal. Normal Exam - Right-Upper Extremity Strength Normal, Lower Extremity Weakness.  Lymphatic Head & Neck  General Head & Neck Lymphatics: Bilateral - Description - Normal. Axillary  General Axillary Region: Bilateral - Description - Normal. Tenderness - Non Tender. Femoral & Inguinal  Generalized Femoral & Inguinal  Lymphatics: Bilateral - Description - Normal. Tenderness - Non Tender.    Assessment & Plan  POSTOPERATIVE INCISIONAL HERNIA (553.21  K43.2) Impression: Patient desires elective repair to avoid progression. 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 Derrell LollingIngram, SchroederD., [facility] FacilityFACS [facility] General and Minimally invasive Surgery Breast and Colorectal Surgery Office:   [phone] Pager:   [phone]

## 2013-11-11 NOTE — Anesthesia Preprocedure Evaluation (Addendum)
Anesthesia Evaluation  Patient identified by MRN, date of birth, ID band Patient awake    Reviewed: Allergy & Precautions, H&P , NPO status , Patient's Chart, lab work & pertinent test results  Airway Mallampati: II TM Distance: >3 FB Neck ROM: Full    Dental no notable dental hx. (+) Teeth Intact, Dental Advisory Given   Pulmonary neg pulmonary ROS,  breath sounds clear to auscultation  Pulmonary exam normal       Cardiovascular negative cardio ROS  Rhythm:Regular Rate:Normal     Neuro/Psych  Headaches, negative psych ROS   GI/Hepatic Neg liver ROS, GERD-  Controlled,  Endo/Other  Hypothyroidism (with pregnancy)   Renal/GU negative Renal ROS  negative genitourinary   Musculoskeletal negative musculoskeletal ROS (+)   Abdominal   Peds negative pediatric ROS (+)  Hematology negative hematology ROS (+)   Anesthesia Other Findings   Reproductive/Obstetrics negative OB ROS                          Anesthesia Physical Anesthesia Plan  ASA: II  Anesthesia Plan: General   Post-op Pain Management:    Induction: Intravenous  Airway Management Planned: Oral ETT  Additional Equipment:   Intra-op Plan:   Post-operative Plan: Extubation in OR  Informed Consent: I have reviewed the patients History and Physical, chart, labs and discussed the procedure including the risks, benefits and alternatives for the proposed anesthesia with the patient or authorized representative who has indicated his/her understanding and acceptance.   Dental advisory given  Plan Discussed with: CRNA  Anesthesia Plan Comments:         Anesthesia Quick Evaluation

## 2013-11-12 ENCOUNTER — Encounter (HOSPITAL_COMMUNITY): Payer: Medicaid Other | Admitting: Anesthesiology

## 2013-11-12 ENCOUNTER — Ambulatory Visit (HOSPITAL_COMMUNITY): Payer: Medicaid Other | Admitting: Anesthesiology

## 2013-11-12 ENCOUNTER — Encounter (HOSPITAL_COMMUNITY): Payer: Self-pay | Admitting: *Deleted

## 2013-11-12 ENCOUNTER — Observation Stay (HOSPITAL_COMMUNITY)
Admission: RE | Admit: 2013-11-12 | Discharge: 2013-11-14 | Disposition: A | Discharge status: Home / Self Care | Payer: Medicaid Other | Source: Ambulatory Visit | Attending: General Surgery | Admitting: General Surgery

## 2013-11-12 ENCOUNTER — Encounter (HOSPITAL_COMMUNITY)
Admission: RE | Disposition: A | Discharge status: Home / Self Care | Payer: Self-pay | Source: Ambulatory Visit | Attending: General Surgery

## 2013-11-12 DIAGNOSIS — K219 Gastro-esophageal reflux disease without esophagitis: Secondary | ICD-10-CM | POA: Insufficient documentation

## 2013-11-12 DIAGNOSIS — E669 Obesity, unspecified: Secondary | ICD-10-CM | POA: Diagnosis not present

## 2013-11-12 DIAGNOSIS — K432 Incisional hernia without obstruction or gangrene: Secondary | ICD-10-CM | POA: Insufficient documentation

## 2013-11-12 DIAGNOSIS — Z6834 Body mass index (BMI) 34.0-34.9, adult: Secondary | ICD-10-CM | POA: Insufficient documentation

## 2013-11-12 DIAGNOSIS — M6208 Separation of muscle (nontraumatic), other site: Secondary | ICD-10-CM | POA: Insufficient documentation

## 2013-11-12 DIAGNOSIS — E039 Hypothyroidism, unspecified: Secondary | ICD-10-CM | POA: Diagnosis not present

## 2013-11-12 DIAGNOSIS — K439 Ventral hernia without obstruction or gangrene: Principal | ICD-10-CM | POA: Insufficient documentation

## 2013-11-12 HISTORY — PX: INSERTION OF MESH: SHX5868

## 2013-11-12 HISTORY — DX: Incisional hernia without obstruction or gangrene: K43.2

## 2013-11-12 HISTORY — PX: VENTRAL HERNIA REPAIR: SHX424

## 2013-11-12 LAB — CBC
HEMATOCRIT: 39.1 % (ref 36.0–46.0)
Hemoglobin: 13.5 g/dL (ref 12.0–15.0)
MCH: 31.5 pg (ref 26.0–34.0)
MCHC: 34.5 g/dL (ref 30.0–36.0)
MCV: 91.4 fL (ref 78.0–100.0)
PLATELETS: 195 10*3/uL (ref 150–400)
RBC: 4.28 MIL/uL (ref 3.87–5.11)
RDW: 13.6 % (ref 11.5–15.5)
WBC: 15.2 10*3/uL — ABNORMAL HIGH (ref 4.0–10.5)

## 2013-11-12 LAB — CREATININE, SERUM
Creatinine, Ser: 0.62 mg/dL (ref 0.50–1.10)
GFR calc Af Amer: >90 mL/min (ref >90)
GFR calc non Af Amer: >90 mL/min (ref >90)

## 2013-11-12 SURGERY — REPAIR, HERNIA, VENTRAL, LAPAROSCOPIC
Anesthesia: General | Site: Abdomen

## 2013-11-12 MED ORDER — HYDROMORPHONE HCL 2 MG/ML IJ SOLN
INTRAMUSCULAR | Status: AC
  Filled 2013-11-12: qty 1

## 2013-11-12 MED ORDER — HYDROMORPHONE HCL 1 MG/ML IJ SOLN
0.2500 mg | INTRAMUSCULAR | Status: DC | PRN
  Administered 2013-11-12 (×2): 0.5 mg via INTRAVENOUS

## 2013-11-12 MED ORDER — CEFAZOLIN SODIUM-DEXTROSE 2-3 GM-% IV SOLR
2.0000 g | Freq: Three times a day (TID) | INTRAVENOUS | Status: AC
  Administered 2013-11-12 – 2013-11-13 (×3): 2 g via INTRAVENOUS
  Filled 2013-11-12 (×3): qty 50

## 2013-11-12 MED ORDER — HYDROMORPHONE HCL 1 MG/ML IJ SOLN
INTRAMUSCULAR | Status: AC
  Filled 2013-11-12: qty 1

## 2013-11-12 MED ORDER — DEXAMETHASONE SODIUM PHOSPHATE 10 MG/ML IJ SOLN
INTRAMUSCULAR | Status: AC
  Filled 2013-11-12: qty 1

## 2013-11-12 MED ORDER — ONDANSETRON HCL 4 MG/2ML IJ SOLN
INTRAMUSCULAR | Status: DC | PRN
  Administered 2013-11-12: 4 mg via INTRAVENOUS

## 2013-11-12 MED ORDER — ONDANSETRON HCL 4 MG PO TABS
4.0000 mg | ORAL_TABLET | Freq: Four times a day (QID) | ORAL | Status: DC | PRN
  Administered 2013-11-12: 4 mg via ORAL
  Filled 2013-11-12: qty 1

## 2013-11-12 MED ORDER — HYDROMORPHONE HCL 1 MG/ML IJ SOLN
INTRAMUSCULAR | Status: DC | PRN
  Administered 2013-11-12: 1 mg via INTRAVENOUS

## 2013-11-12 MED ORDER — ONDANSETRON HCL 4 MG/2ML IJ SOLN
INTRAMUSCULAR | Status: AC
  Filled 2013-11-12: qty 2

## 2013-11-12 MED ORDER — BUPIVACAINE-EPINEPHRINE 0.5% -1:200000 IJ SOLN
INTRAMUSCULAR | Status: DC | PRN
  Administered 2013-11-12: 23 mL

## 2013-11-12 MED ORDER — LIDOCAINE HCL (CARDIAC) 20 MG/ML IV SOLN
INTRAVENOUS | Status: DC | PRN
  Administered 2013-11-12: 25 mg via INTRATRACHEAL
  Administered 2013-11-12: 75 mg via INTRAVENOUS

## 2013-11-12 MED ORDER — ONDANSETRON HCL 4 MG/2ML IJ SOLN
4.0000 mg | INTRAMUSCULAR | Status: DC | PRN
  Administered 2013-11-12 – 2013-11-13 (×2): 4 mg via INTRAVENOUS
  Filled 2013-11-12: qty 2

## 2013-11-12 MED ORDER — POTASSIUM CHLORIDE IN NACL 20-0.9 MEQ/L-% IV SOLN
INTRAVENOUS | Status: DC
  Administered 2013-11-12 – 2013-11-13 (×2): via INTRAVENOUS
  Filled 2013-11-12 (×4): qty 1000

## 2013-11-12 MED ORDER — CEFAZOLIN SODIUM-DEXTROSE 2-3 GM-% IV SOLR
INTRAVENOUS | Status: AC
  Filled 2013-11-12: qty 50

## 2013-11-12 MED ORDER — DEXAMETHASONE SODIUM PHOSPHATE 10 MG/ML IJ SOLN
INTRAMUSCULAR | Status: DC | PRN
  Administered 2013-11-12: 10 mg via INTRAVENOUS

## 2013-11-12 MED ORDER — BUPIVACAINE-EPINEPHRINE 0.5% -1:200000 IJ SOLN
INTRAMUSCULAR | Status: AC
  Filled 2013-11-12: qty 1

## 2013-11-12 MED ORDER — CEFAZOLIN SODIUM-DEXTROSE 2-3 GM-% IV SOLR
2.0000 g | INTRAVENOUS | Status: AC
  Administered 2013-11-12: 2 g via INTRAVENOUS

## 2013-11-12 MED ORDER — CHLORHEXIDINE GLUCONATE 4 % EX LIQD: 1.0000 "application " | Freq: Once | CUTANEOUS | Status: DC

## 2013-11-12 MED ORDER — FENTANYL CITRATE 0.05 MG/ML IJ SOLN
INTRAMUSCULAR | Status: AC
  Filled 2013-11-12: qty 2

## 2013-11-12 MED ORDER — MIDAZOLAM HCL 2 MG/2ML IJ SOLN
INTRAMUSCULAR | Status: AC
  Filled 2013-11-12: qty 2

## 2013-11-12 MED ORDER — 0.9 % SODIUM CHLORIDE (POUR BTL) OPTIME
TOPICAL | Status: DC | PRN
  Administered 2013-11-12: 1000 mL

## 2013-11-12 MED ORDER — ROCURONIUM BROMIDE 100 MG/10ML IV SOLN
INTRAVENOUS | Status: DC | PRN
  Administered 2013-11-12: 20 mg via INTRAVENOUS
  Administered 2013-11-12: 5 mg via INTRAVENOUS
  Administered 2013-11-12: 55 mg via INTRAVENOUS

## 2013-11-12 MED ORDER — ENOXAPARIN SODIUM 40 MG/0.4ML ~~LOC~~ SOLN
40.0000 mg | SUBCUTANEOUS | Status: DC
  Administered 2013-11-13 – 2013-11-14 (×2): 40 mg via SUBCUTANEOUS
  Filled 2013-11-12 (×3): qty 0.4

## 2013-11-12 MED ORDER — LIP MEDEX EX OINT
TOPICAL_OINTMENT | CUTANEOUS | Status: AC
  Administered 2013-11-12: 14:00:00
  Filled 2013-11-12: qty 7

## 2013-11-12 MED ORDER — NEOSTIGMINE METHYLSULFATE 10 MG/10ML IV SOLN
INTRAVENOUS | Status: DC | PRN
  Administered 2013-11-12: 4 mg via INTRAVENOUS

## 2013-11-12 MED ORDER — SUFENTANIL CITRATE 50 MCG/ML IV SOLN
INTRAVENOUS | Status: DC | PRN
  Administered 2013-11-12 (×2): 10 ug via INTRAVENOUS
  Administered 2013-11-12: 15 ug via INTRAVENOUS
  Administered 2013-11-12: 10 ug via INTRAVENOUS
  Administered 2013-11-12: 15 ug via INTRAVENOUS
  Administered 2013-11-12: 10 ug via INTRAVENOUS
  Administered 2013-11-12: 5 ug via INTRAVENOUS
  Administered 2013-11-12: 15 ug via INTRAVENOUS

## 2013-11-12 MED ORDER — PRENATAL PLUS 27-1 MG PO TABS
1.0000 | ORAL_TABLET | Freq: Every day | ORAL | Status: DC
  Administered 2013-11-12 – 2013-11-14 (×3): 1 via ORAL
  Filled 2013-11-12 (×3): qty 1

## 2013-11-12 MED ORDER — NEOSTIGMINE METHYLSULFATE 10 MG/10ML IV SOLN
INTRAVENOUS | Status: AC
  Filled 2013-11-12: qty 1

## 2013-11-12 MED ORDER — FENTANYL CITRATE 0.05 MG/ML IJ SOLN
25.0000 ug | INTRAMUSCULAR | Status: DC | PRN
  Administered 2013-11-12 (×2): 50 ug via INTRAVENOUS

## 2013-11-12 MED ORDER — MIDAZOLAM HCL 5 MG/5ML IJ SOLN
INTRAMUSCULAR | Status: DC | PRN
  Administered 2013-11-12: 2 mg via INTRAVENOUS

## 2013-11-12 MED ORDER — ROCURONIUM BROMIDE 100 MG/10ML IV SOLN
INTRAVENOUS | Status: AC
  Filled 2013-11-12: qty 1

## 2013-11-12 MED ORDER — SUCCINYLCHOLINE CHLORIDE 20 MG/ML IJ SOLN
INTRAMUSCULAR | Status: DC | PRN
  Administered 2013-11-12: 100 mg via INTRAVENOUS

## 2013-11-12 MED ORDER — OXYCODONE-ACETAMINOPHEN 5-325 MG PO TABS
1.0000 | ORAL_TABLET | ORAL | Status: DC | PRN
  Administered 2013-11-12: 1 via ORAL
  Administered 2013-11-12 – 2013-11-14 (×6): 2 via ORAL
  Filled 2013-11-12 (×6): qty 2
  Filled 2013-11-12: qty 1
  Filled 2013-11-12: qty 2

## 2013-11-12 MED ORDER — HYDROMORPHONE HCL 1 MG/ML IJ SOLN
1.0000 mg | INTRAMUSCULAR | Status: DC | PRN
  Administered 2013-11-12 – 2013-11-13 (×5): 1 mg via INTRAVENOUS
  Filled 2013-11-12 (×5): qty 1

## 2013-11-12 MED ORDER — PROPOFOL 10 MG/ML IV BOLUS
INTRAVENOUS | Status: AC
  Filled 2013-11-12: qty 20

## 2013-11-12 MED ORDER — LACTATED RINGERS IV SOLN
INTRAVENOUS | Status: DC | PRN
  Administered 2013-11-12 (×2): via INTRAVENOUS

## 2013-11-12 MED ORDER — LIDOCAINE HCL (CARDIAC) 20 MG/ML IV SOLN
INTRAVENOUS | Status: AC
Start: 2013-11-12 — End: 2013-11-12
  Filled 2013-11-12: qty 5

## 2013-11-12 MED ORDER — LACTATED RINGERS IR SOLN
Status: DC | PRN
  Administered 2013-11-12: 1000 mL

## 2013-11-12 MED ORDER — SODIUM CHLORIDE 0.9 % IJ SOLN
INTRAMUSCULAR | Status: AC
  Filled 2013-11-12: qty 10

## 2013-11-12 MED ORDER — ACETAMINOPHEN 10 MG/ML IV SOLN
INTRAVENOUS | Status: DC | PRN
  Administered 2013-11-12: 1000 mg via INTRAVENOUS

## 2013-11-12 MED ORDER — SUFENTANIL CITRATE 50 MCG/ML IV SOLN
INTRAVENOUS | Status: AC
  Filled 2013-11-12: qty 1

## 2013-11-12 MED ORDER — GLYCOPYRROLATE 0.2 MG/ML IJ SOLN
INTRAMUSCULAR | Status: AC
  Filled 2013-11-12: qty 2

## 2013-11-12 MED ORDER — PROPOFOL 10 MG/ML IV BOLUS
INTRAVENOUS | Status: DC | PRN
  Administered 2013-11-12: 200 mg via INTRAVENOUS

## 2013-11-12 MED ORDER — ONDANSETRON HCL 4 MG/2ML IJ SOLN: 4.0000 mg | Freq: Once | INTRAMUSCULAR | Status: DC | PRN

## 2013-11-12 MED ORDER — ACETAMINOPHEN 10 MG/ML IV SOLN
1000.0000 mg | Freq: Once | INTRAVENOUS | Status: DC
  Filled 2013-11-12: qty 100

## 2013-11-12 SURGICAL SUPPLY — 41 items
APPLIER CLIP 5 13 M/L LIGAMAX5 (MISCELLANEOUS)
BENZOIN TINCTURE PRP APPL 2/3 (GAUZE/BANDAGES/DRESSINGS) IMPLANT
BINDER ABDOMINAL 12 ML 46-62 (SOFTGOODS) IMPLANT
CLIP APPLIE 5 13 M/L LIGAMAX5 (MISCELLANEOUS) IMPLANT
CLOSURE WOUND 1/2 X4 (GAUZE/BANDAGES/DRESSINGS)
DECANTER SPIKE VIAL GLASS SM (MISCELLANEOUS) ×3 IMPLANT
DERMABOND ADVANCED (GAUZE/BANDAGES/DRESSINGS) ×4
DERMABOND ADVANCED .7 DNX12 (GAUZE/BANDAGES/DRESSINGS) ×2 IMPLANT
DEVICE SECURE STRAP 25 ABSORB (INSTRUMENTS) ×9 IMPLANT
DEVICE TROCAR PUNCTURE CLOSURE (ENDOMECHANICALS) ×3 IMPLANT
DRAPE LAPAROSCOPIC ABDOMINAL (DRAPES) ×3 IMPLANT
DRAPE UTILITY XL STRL (DRAPES) ×3 IMPLANT
DRSG PAD ABDOMINAL 8X10 ST (GAUZE/BANDAGES/DRESSINGS) IMPLANT
ELECT REM PT RETURN 9FT ADLT (ELECTROSURGICAL) ×3
ELECTRODE REM PT RTRN 9FT ADLT (ELECTROSURGICAL) ×1 IMPLANT
GLOVE EUDERMIC 7 POWDERFREE (GLOVE) ×3 IMPLANT
GOWN STRL REUS W/TWL XL LVL3 (GOWN DISPOSABLE) ×6 IMPLANT
KIT BASIN OR (CUSTOM PROCEDURE TRAY) ×3 IMPLANT
MARKER SKIN DUAL TIP RULER LAB (MISCELLANEOUS) ×3 IMPLANT
MESH VENTRALIGHT ST 8X10 (Mesh General) ×3 IMPLANT
NEEDLE SPNL 22GX3.5 QUINCKE BK (NEEDLE) ×3 IMPLANT
SCALPEL HARMONIC ACE (MISCELLANEOUS) ×3 IMPLANT
SCISSORS LAP 5X35 DISP (ENDOMECHANICALS) ×3 IMPLANT
SET IRRIG TUBING LAPAROSCOPIC (IRRIGATION / IRRIGATOR) IMPLANT
SHEARS HARMONIC ACE PLUS 36CM (ENDOMECHANICALS) IMPLANT
SLEEVE XCEL OPT CAN 5 100 (ENDOMECHANICALS) ×6 IMPLANT
SOLUTION ANTI FOG 6CC (MISCELLANEOUS) ×3 IMPLANT
STAPLER VISISTAT 35W (STAPLE) IMPLANT
STRIP CLOSURE SKIN 1/2X4 (GAUZE/BANDAGES/DRESSINGS) IMPLANT
SUT MNCRL AB 4-0 PS2 18 (SUTURE) ×6 IMPLANT
SUT NOVA 0 T19/GS 22DT (SUTURE) IMPLANT
SUT NOVA NAB DX-16 0-1 5-0 T12 (SUTURE) IMPLANT
SUT NOVA NAB GS-21 1 T12 (SUTURE) ×6 IMPLANT
TACKER 5MM HERNIA 3.5CML NAB (ENDOMECHANICALS) IMPLANT
TOWEL OR 17X26 10 PK STRL BLUE (TOWEL DISPOSABLE) ×3 IMPLANT
TOWEL OR NON WOVEN STRL DISP B (DISPOSABLE) ×3 IMPLANT
TRAY FOLEY CATH 14FRSI W/METER (CATHETERS) ×3 IMPLANT
TRAY LAPAROSCOPIC (CUSTOM PROCEDURE TRAY) ×3 IMPLANT
TROCAR BLADELESS OPT 5 100 (ENDOMECHANICALS) ×3 IMPLANT
TROCAR XCEL NON-BLD 11X100MML (ENDOMECHANICALS) ×3 IMPLANT
TUBING INSUFFLATION 10FT LAP (TUBING) ×3 IMPLANT

## 2013-11-12 NOTE — Op Note (Signed)
Patient Name:           Catherine Schroeder   Date of Surgery:        11/12/2013  Pre op Diagnosis:      Multiple ventral hernias  Post op Diagnosis:    same  Procedure:                Laparoscopic repair multiple ventral hernias with 20 X 25 cm VentraLite ST mesh  Surgeon:                     Angelia MouldHaywood M. Derrell LollingIngram, M.D., FACS  Assistant:                      OR staff  Operative Indications:   The patient is a 28 year old female who presents with an incisional hernia. This patient was referred back to me by herself for evaluation of abdominal wall hernias.  In 2012 she presented with obstructive jaundice, underwent ERCP and sphincterotomy for common duct stones and I performed laparoscopic cholecystectomy with cholangiogram. She was 2 months postpartum at that time. She recovered uneventfully.  She now presents with a almost 2 year history of a painful bulge in the midline above the umbilicus. She was evaluated in the emergency department last month for this. No nausea or vomiting. It just causes pain and a bulge.  She does not have a PCP. She has no medical problems.  Comorbidities include hypothyroidism, GERD, obesity.  On exam, she has a small incisional hernia at the umbilicus and a larger epigastric hernia in the upper midline. After discussing the techniques of surgery, natural history, and options, she decided she wanted to go ahead and undergo repair.  We will plan a laparoscopic repair of multiple abdominal wall hernias with mesh, possible open repair.     Operative Findings:       There was a relatively small periumbilical incisional hernia, about 2 cm in size. In the midline.  Above that,  extending all the way up to thefalciform ligament and slightly above, there was a hernia bulge about 6-7 cm vertically by about 4 cm transversely. I had to take down the entirety of the falciform ligament to gain exposure above. Otherwise there were no significant adhesions. There was no visual evidence  of any visceral abnormality.  Procedure in Detail:          Following the induction of general endotracheal anesthesia a Foley catheter was placed. Intravenous antibiotics were given. Patient was prepped and draped from the breast all the way down to the pubic hair. Surgical timeout was performed. 0.5% Marcaine with epinephrine was used as local infiltration anesthetic.     A 5 mm optical trocar was placed in the left subcostal region. Optical entry was uneventful. Pneumoperitoneum was created and video camera was inserted. There was no sign of any bleeding or injury. A 12 mm trocar was  placed in the left flank. 5 mm trocar placed in the left lower quadrant. Later in the case I placed two 5 mm trocars in the right lateral abdomen for visualization. Using the harmonic scalpel I took  the falciform ligament down completely. The lower end of falciform ligament was receding into the upper hernia defect. After thiswas taken down I used a spinal needle and a marking pen to mark the edges of the mesh to get a 5-7 cm overlap in all directions.   I used a 25 cm x 20  cm ventraLite ST mesh and brought that to the operative field. I found that I could trim that down a little bit. I marked a template on the abdominal wall using a marking pen for 8 suture fixation sites. After marking the template I placed #1 Novafil sutures in the mesh at the edge. I was careful to mark the rough side of the mesh up in the smooth side of the mesh down toward the viscera.     The mesh was moistened with saline, rolled up and inserted and spread out oriented to the drawn template. At each of the 8 suture fixation sites I made  a small puncture wound. Using an Endo Close device I drew the Novafil sutures through the abdominal wall, being sure to take a 1 cm bite of tissue. After all of these were placed and passed through the abdominal wall. I lifted them up and tied all of them. The mesh deployed nicely with almost no redundancy. I then used  theSecureStrap device to further secure the mesh to the abdominal wall in a double crown technique. I inspected this numerous times from the right side and the left side and  I found no defect in the fixation. At the perimeter I was careful to place fixation no more than 1 cm apart. There was no bleeding. Trochars were removed. Pneumoperitoneum was released. Skin incisions were closed with subcuticular 4-0 Monocryl and Dermabond. Abdominal binder was placed and patient taken to PACU in stable condition. EBL 20 mL or less. Counts correct. Complications none.     Angelia MouldHaywood M. Derrell LollingIngram, M.D., FACS General and Minimally Invasive Surgery Breast and Colorectal Surgery  11/12/2013 9:48 AM

## 2013-11-12 NOTE — Interval H&P Note (Signed)
History and Physical Interval Note:  11/12/2013 6:39 AM  Catherine Schroeder  has presented today for surgery, with the diagnosis of verntral hernia  The various methods of treatment have been discussed with the patient and family. After consideration of risks, benefits and other options for treatment, the patient has consented to  Procedure(s): LAPAROSCOPIC MULTIPLE  VENTRAL HERNIAS POSSIBLE OPEN (N/A) INSERTION OF MESH (N/A) as a surgical intervention .  The patient's history has been reviewed, patient examined today, no change in status, stable for surgery.  I have reviewed the patient's chart and labs.  Questions were answered to the patient's satisfaction.     Ernestene MentionINGRAM,Carloyn Lahue M

## 2013-11-12 NOTE — Anesthesia Postprocedure Evaluation (Signed)
  Anesthesia Post-op Note  Patient: Roselyn BeringLisa M Orama  Procedure(s) Performed: Procedure(s) (LRB): LAPAROSCOPIC MULTIPLE  VENTRAL HERNIAS POSSIBLE OPEN (N/A) INSERTION OF MESH (N/A)  Patient Location: PACU  Anesthesia Type: General  Level of Consciousness: awake and alert   Airway and Oxygen Therapy: Patient Spontanous Breathing  Post-op Pain: mild  Post-op Assessment: Post-op Vital signs reviewed, Patient's Cardiovascular Status Stable, Respiratory Function Stable, Patent Airway and No signs of Nausea or vomiting  Last Vitals:  Filed Vitals:   11/12/13 1100  BP: 120/86  Pulse: 84  Temp:   Resp: 14    Post-op Vital Signs: stable   Complications: No apparent anesthesia complications

## 2013-11-12 NOTE — Transfer of Care (Signed)
Immediate Anesthesia Transfer of Care Note  Patient: Roselyn BeringLisa M Leaver  Procedure(s) Performed: Procedure(s): LAPAROSCOPIC MULTIPLE  VENTRAL HERNIAS POSSIBLE OPEN (N/A) INSERTION OF MESH (N/A)  Patient Location: PACU  Anesthesia Type:General  Level of Consciousness: awake, alert , oriented and patient cooperative  Airway & Oxygen Therapy: Patient Spontanous Breathing and Patient connected to face mask oxygen  Post-op Assessment: Report given to PACU RN, Post -op Vital signs reviewed and stable and Patient moving all extremities X 4  Post vital signs: stable  Complications: No apparent anesthesia complications

## 2013-11-13 ENCOUNTER — Encounter (HOSPITAL_COMMUNITY): Payer: Self-pay | Admitting: General Surgery

## 2013-11-13 DIAGNOSIS — K439 Ventral hernia without obstruction or gangrene: Secondary | ICD-10-CM | POA: Diagnosis not present

## 2013-11-13 LAB — BASIC METABOLIC PANEL
Anion gap: 11 (ref 5–15)
BUN: 8 mg/dL (ref 6–23)
CHLORIDE: 104 meq/L (ref 96–112)
CO2: 24 meq/L (ref 19–32)
CREATININE: 0.57 mg/dL (ref 0.50–1.10)
Calcium: 9.4 mg/dL (ref 8.4–10.5)
GFR calc non Af Amer: >90 mL/min (ref >90)
Glucose, Bld: 114 mg/dL — ABNORMAL HIGH (ref 70–99)
Potassium: 4 mEq/L (ref 3.7–5.3)
Sodium: 139 mEq/L (ref 137–147)

## 2013-11-13 LAB — CBC
HCT: 36.8 % (ref 36.0–46.0)
Hemoglobin: 12.4 g/dL (ref 12.0–15.0)
MCH: 30.4 pg (ref 26.0–34.0)
MCHC: 33.7 g/dL (ref 30.0–36.0)
MCV: 90.2 fL (ref 78.0–100.0)
Platelets: 252 10*3/uL (ref 150–400)
RBC: 4.08 MIL/uL (ref 3.87–5.11)
RDW: 13.7 % (ref 11.5–15.5)
WBC: 13.8 10*3/uL — ABNORMAL HIGH (ref 4.0–10.5)

## 2013-11-13 MED ORDER — FENTANYL CITRATE 0.05 MG/ML IJ SOLN
25.0000 ug | INTRAMUSCULAR | Status: DC | PRN
  Administered 2013-11-13 – 2013-11-14 (×4): 25 ug via INTRAVENOUS
  Filled 2013-11-13 (×4): qty 2

## 2013-11-13 MED ORDER — DIPHENHYDRAMINE HCL 50 MG PO CAPS
50.0000 mg | ORAL_CAPSULE | Freq: Four times a day (QID) | ORAL | Status: DC | PRN
  Administered 2013-11-13 (×2): 50 mg via ORAL
  Filled 2013-11-13 (×2): qty 1

## 2013-11-13 MED ORDER — KETOROLAC TROMETHAMINE 30 MG/ML IJ SOLN
30.0000 mg | Freq: Three times a day (TID) | INTRAMUSCULAR | Status: DC
  Administered 2013-11-13 – 2013-11-14 (×4): 30 mg via INTRAVENOUS
  Filled 2013-11-13 (×7): qty 1

## 2013-11-13 NOTE — Progress Notes (Signed)
1 Day Post-Op  Subjective: Stable and alert. Lots of incisional pain. Some itching but no skin rash. No nausea. Tolerating light diet. Voiding normally. Ambulating in hall.  Objective: Vital signs in last 24 hours: Temp:  [97.3 F (36.3 C)-98.9 F (37.2 C)] 97.3 F (36.3 C) (10/27 0122) Pulse Rate:  [16-104] 86 (10/27 0122) Resp:  [14-24] 18 (10/27 0122) BP: (114-156)/(58-90) 114/58 mmHg (10/27 0122) SpO2:  [95 %-100 %] 98 % (10/27 0122) Weight:  [201 lb 6 oz (91.343 kg)] 201 lb 6 oz (91.343 kg) (10/26 0615) Last BM Date: 11/12/13  Intake/Output from previous day: 10/26 0701 - 10/27 0700 In: 2078.3 [I.V.:2028.3; IV Piggyback:50] Out: 3070 [Urine:3020; Blood:50] Intake/Output this shift: Total I/O In: -  Out: 2675 [Urine:2675]  General appearance: Alert. Cooperative. Pleasant. Mild distress from incisional pain. Resp: clear to auscultation bilaterally GI: Abdomen soft. Active bowel sounds. Wounds look fine. All incisional areas are somewhat touchy. Laterally the abdomen is soft. No bleeding.  Lab Results:  Results for orders placed during the hospital encounter of 11/12/13 (from the past 24 hour(s))  CBC     Status: Abnormal   Collection Time    11/12/13 12:41 PM      Result Value Ref Range   WBC 15.2 (*) 4.0 - 10.5 K/uL   RBC 4.28  3.87 - 5.11 MIL/uL   Hemoglobin 13.5  12.0 - 15.0 g/dL   HCT 04.539.1  40.936.0 - 81.146.0 %   MCV 91.4  78.0 - 100.0 fL   MCH 31.5  26.0 - 34.0 pg   MCHC 34.5  30.0 - 36.0 g/dL   RDW 91.413.6  78.211.5 - 95.615.5 %   Platelets 195  150 - 400 K/uL  CREATININE, SERUM     Status: None   Collection Time    11/12/13 12:41 PM      Result Value Ref Range   Creatinine, Ser 0.62  0.50 - 1.10 mg/dL   GFR calc non Af Amer >90  >90 mL/min   GFR calc Af Amer >90  >90 mL/min     Studies/Results: No results found.  Marland Kitchen.  ceFAZolin (ANCEF) IV  2 g Intravenous Q8H  . enoxaparin (LOVENOX) injection  40 mg Subcutaneous Q24H  . ketorolac  30 mg Intravenous 3 times per day   . prenatal vitamin w/FE, FA  1 tablet Oral Daily     Assessment/Plan: s/p Procedure(s): LAPAROSCOPIC MULTIPLE  VENTRAL HERNIAS POSSIBLE OPEN INSERTION OF MESH  POD #1. Laparoscopic repair of multiple ventral hernias with mesh. Stable. No clinical evidence of surgical complication. Not ready to go home due to pain control issues. Switch to fentanyl, discontinue Dilaudid Benadryl for itching Advance diet and activities Reduce IV rate  @PROBHOSP @  LOS: 1 day    Jennet Scroggin M 11/13/2013  . .prob

## 2013-11-14 DIAGNOSIS — K439 Ventral hernia without obstruction or gangrene: Secondary | ICD-10-CM | POA: Diagnosis not present

## 2013-11-14 MED ORDER — OXYCODONE-ACETAMINOPHEN 5-325 MG PO TABS: 1.0000 | ORAL_TABLET | Freq: Four times a day (QID) | ORAL | Status: DC | PRN

## 2013-11-14 MED ORDER — POLYETHYLENE GLYCOL 3350 17 G PO PACK
17.0000 g | PACK | Freq: Once | ORAL | Status: DC
  Filled 2013-11-14: qty 1

## 2013-11-14 MED ORDER — POLYETHYLENE GLYCOL 3350 17 G PO PACK
17.0000 g | PACK | Freq: Every day | ORAL | Status: DC
  Filled 2013-11-14: qty 1

## 2013-11-14 NOTE — Discharge Instructions (Signed)
-  see above 

## 2013-11-14 NOTE — Progress Notes (Signed)
UR completed 

## 2013-11-14 NOTE — Discharge Summary (Signed)
  Patient ID: Catherine Schroeder [id number] 28 y.o. 1985-06-04  Admit date: 11/12/2013  Discharge date and time: 11/14/2013  Admitting Physician: Ernestene MentionINGRAM,Gwenevere Goga M  Discharge Physician: Ernestene MentionINGRAM,Fleet Higham M  Admission Diagnoses: verntral hernia  Discharge Diagnoses: ventral incisional hernia obesity   Operations: Procedure(s): LAPAROSCOPIC MULTIPLE  VENTRAL HERNIAS POSSIBLE OPEN INSERTION OF MESH  Admission Condition: good  Discharged Condition: good  Indication for Admission: The patient is a 28 year old female who presents with an incisional hernia. This patient was referred back to me by herself for evaluation of abdominal wall hernias.  In 2012 she presented with obstructive jaundice, underwent ERCP and sphincterotomy for common duct stones and I performed laparoscopic cholecystectomy with cholangiogram. She was 2 months postpartum at that time. She recovered uneventfully.  She now presents with a almost 2 year history of a painful bulge in the midline above the umbilicus. She was evaluated in the emergency department last month for this. No nausea or vomiting. It just causes pain and a bulge.  She does not have a PCP. She has no medical problems.  Comorbidities include hypothyroidism, GERD, obesity.  On exam, she has a small incisional hernia at the umbilicus and a larger epigastric hernia in the upper midline. After discussing the techniques of surgery, natural history, and options, she decided she wanted to go ahead and undergo repair.  We will plan a laparoscopic repair of multiple abdominal wall hernias with mesh, possible open repair.    Hospital Course: On the day of admission the patient went to the operating room and underwent laparoscopic repair of her periumbilical incisional hernia and epigastric hernia. A single piece of mesh was utilized. The surgery was uneventful. On postop day 1 the patient was tolerating liquid diet and ambulate to the bathroom but was having too much  pain to go home. Postop day 2 she was doing much better and felt ready to go home. She had not had a bowel movement but was voiding uneventfully, tolerating a regular diet and pain was under much better control. Examination of the day of discharge revealed her abdomen was soft with minimal incisional tenderness. Active bowel sounds. Wounds looked good. She was given instructions in diet and activities. She was given a prescription for Percocet for pain. She was asked to return to see me in the office in 2 weeks.  Consults: None  Significant Diagnostic Studies: none  Treatments: surgery: Laparoscopic repair of multiple ventral incisional hernias with mesh  Disposition: Home  Patient Instructions:    Medication List         ibuprofen 200 MG tablet  Commonly known as:  ADVIL,MOTRIN  Take 200 mg by mouth every 6 (six) hours as needed for headache.     oxyCODONE-acetaminophen 5-325 MG per tablet  Commonly known as:  ROXICET  Take 1-2 tablets by mouth every 6 (six) hours as needed for severe pain.     prenatal vitamin w/FE, FA 27-1 MG Tabs tablet  Take 1 tablet by mouth every morning.        Activity: no heavy lifting for 6 weeks Diet: low fat, low cholesterol diet Wound Care: none needed  Follow-up:  With Dr. Derrell LollingIngram in 2 weeks.  Signed: Angelia MouldHaywood M. Derrell LollingIngram, M.D., FACS General and minimally invasive surgery Breast and Colorectal Surgery  11/14/2013, 12:45 PM

## 2013-11-19 ENCOUNTER — Encounter (HOSPITAL_COMMUNITY): Payer: Self-pay | Admitting: General Surgery

## 2014-03-28 ENCOUNTER — Ambulatory Visit (HOSPITAL_COMMUNITY)
Admission: RE | Admit: 2014-03-28 | Discharge: 2014-03-28 | Disposition: A | Discharge status: Home / Self Care | Payer: 59 | Source: Ambulatory Visit | Attending: Internal Medicine | Admitting: Internal Medicine

## 2014-03-28 ENCOUNTER — Other Ambulatory Visit (HOSPITAL_COMMUNITY): Payer: Self-pay | Admitting: Internal Medicine

## 2014-03-28 DIAGNOSIS — R740 Nonspecific elevation of levels of transaminase and lactic acid dehydrogenase [LDH]: Secondary | ICD-10-CM | POA: Insufficient documentation

## 2014-03-28 DIAGNOSIS — R7402 Elevation of levels of lactic acid dehydrogenase (LDH): Secondary | ICD-10-CM

## 2014-12-13 ENCOUNTER — Inpatient Hospital Stay (HOSPITAL_COMMUNITY)
Admission: AD | Admit: 2014-12-13 | Discharge: 2014-12-14 | Disposition: A | Discharge status: Home / Self Care | Payer: 59 | Source: Ambulatory Visit | Attending: Obstetrics | Admitting: Obstetrics

## 2014-12-13 ENCOUNTER — Encounter (HOSPITAL_COMMUNITY): Payer: Self-pay | Admitting: *Deleted

## 2014-12-13 DIAGNOSIS — R51 Headache: Secondary | ICD-10-CM | POA: Diagnosis present

## 2014-12-13 DIAGNOSIS — G43909 Migraine, unspecified, not intractable, without status migrainosus: Secondary | ICD-10-CM | POA: Insufficient documentation

## 2014-12-13 DIAGNOSIS — O210 Mild hyperemesis gravidarum: Secondary | ICD-10-CM | POA: Insufficient documentation

## 2014-12-13 DIAGNOSIS — Z3A12 12 weeks gestation of pregnancy: Secondary | ICD-10-CM | POA: Diagnosis not present

## 2014-12-13 DIAGNOSIS — O219 Vomiting of pregnancy, unspecified: Secondary | ICD-10-CM

## 2014-12-13 DIAGNOSIS — R519 Headache, unspecified: Secondary | ICD-10-CM

## 2014-12-13 DIAGNOSIS — O26891 Other specified pregnancy related conditions, first trimester: Secondary | ICD-10-CM | POA: Diagnosis not present

## 2014-12-13 LAB — POCT PREGNANCY, URINE: Preg Test, Ur: POSITIVE — AB

## 2014-12-13 NOTE — MAU Note (Addendum)
Very nauseated and had headache since Weds night. Vomited twice today. No diarrhea. Has not taken any meds. Has hx migraines

## 2014-12-14 ENCOUNTER — Encounter (HOSPITAL_COMMUNITY): Payer: Self-pay

## 2014-12-14 DIAGNOSIS — O219 Vomiting of pregnancy, unspecified: Secondary | ICD-10-CM

## 2014-12-14 DIAGNOSIS — O26891 Other specified pregnancy related conditions, first trimester: Secondary | ICD-10-CM | POA: Diagnosis not present

## 2014-12-14 LAB — URINALYSIS, ROUTINE W REFLEX MICROSCOPIC
Bilirubin Urine: NEGATIVE
GLUCOSE, UA: NEGATIVE mg/dL
Ketones, ur: NEGATIVE mg/dL
Nitrite: NEGATIVE
PH: 7 (ref 5.0–8.0)
Protein, ur: NEGATIVE mg/dL
SPECIFIC GRAVITY, URINE: 1.015 (ref 1.005–1.030)

## 2014-12-14 LAB — URINE MICROSCOPIC-ADD ON

## 2014-12-14 MED ORDER — LACTATED RINGERS IV SOLN
25.0000 mg | Freq: Once | INTRAVENOUS | Status: AC
  Administered 2014-12-14: 25 mg via INTRAVENOUS
  Filled 2014-12-14: qty 1

## 2014-12-14 MED ORDER — PROMETHAZINE HCL 12.5 MG PO TABS: 12.5000 mg | ORAL_TABLET | Freq: Four times a day (QID) | ORAL | Status: DC | PRN

## 2014-12-14 MED ORDER — BUTALBITAL-APAP-CAFFEINE 50-325-40 MG PO TABS
2.0000 | ORAL_TABLET | Freq: Once | ORAL | Status: AC
  Administered 2014-12-14: 2 via ORAL
  Filled 2014-12-14: qty 2

## 2014-12-14 NOTE — MAU Provider Note (Signed)
History     CSN: [id number]  Arrival date and time: 12/13/14 2314   First Provider Initiated Contact with Patient 12/14/14 0003      Chief Complaint  Patient presents with  . Headache  . Emesis During Pregnancy   HPI Ms. Catherine Schroeder is a 29 y.o. G3P2002 at Unknown who presents to MAU today with complaint of N/V and headache. The patient states that she has had nausea throughout the pregnancy however just began having vomiting today. She states headache has been present for a few days. She rates pain at 10/10 now. She has not taken anything for pain or headache. She denies diarrhea, abdominal pain or vaginal bleeding. The patient does endorse a history of migraines.   OB History    Gravida Para Term Preterm AB TAB SAB Ectopic Multiple Living   4 2 2       2       Past Medical History  Diagnosis Date  . Headache   . Hernia, ventral     MULTIPLE  . Hypothyroidism     during pregnancy only    Past Surgical History  Procedure Laterality Date  . Eus  01/08/2011    Procedure: UPPER ENDOSCOPIC ULTRASOUND (EUS) RADIAL;  Surgeon: Rob Buntinganiel Jacobs, MD;  Location: [facility];  Service: Endoscopy;  Laterality: N/A;  Wants to look with radial EUS scope prior to ERCP  . Ercp  01/08/2011    Procedure: ENDOSCOPIC RETROGRADE CHOLANGIOPANCREATOGRAPHY (ERCP);  Surgeon: Rob Buntinganiel Jacobs, MD;  [facility];  Service: Endoscopy;  Laterality: N/A;  . Cholecystectomy  01/09/2011    Procedure: LAPAROSCOPIC CHOLECYSTECTOMY WITH INTRAOPERATIVE CHOLANGIOGRAM;  Surgeon: Ernestene MentionHaywood M Ingram, MD;  Location: [facility];  Service: General;  Laterality: N/A;  . Ventral hernia repair N/A 11/12/2013    Procedure: LAPAROSCOPIC MULTIPLE  VENTRAL HERNIAS POSSIBLE OPEN;  Surgeon: Claud KelpHaywood Ingram, MD;  Location: [facility];  Service: General;  Laterality: N/A;  . Insertion of mesh N/A 11/12/2013    Procedure: INSERTION OF MESH;  Surgeon: Claud KelpHaywood Ingram, MD;  Location: [facility];  Service: General;  Laterality: N/A;     History reviewed. No pertinent family history.  Social History  Substance Use Topics  . Smoking status: Never Smoker   . Smokeless tobacco: Never Used  . Alcohol Use: No    Allergies: No Known Allergies  Prescriptions prior to admission  Medication Sig Dispense Refill Last Dose  . levothyroxine (SYNTHROID, LEVOTHROID) 25 MCG tablet Take 25 mcg by mouth daily before breakfast. Pt thinks dose is 25   12/13/2014 at Unknown time  . prenatal vitamin w/FE, FA (PRENATAL 1 + 1) 27-1 MG TABS Take 1 tablet by mouth every morning.    12/12/2014 at Unknown time  . ibuprofen (ADVIL,MOTRIN) 200 MG tablet Take 200 mg by mouth every 6 (six) hours as needed for headache.   Past Week at Unknown time  . oxyCODONE-acetaminophen (ROXICET) 5-325 MG per tablet Take 1-2 tablets by mouth every 6 (six) hours as needed for severe pain. 30 tablet 0     Review of Systems  Constitutional: Negative for fever and malaise/fatigue.  Gastrointestinal: Positive for nausea and vomiting. Negative for abdominal pain, diarrhea and constipation.  Genitourinary:       Neg - vaginal bleeding  Neurological: Positive for headaches.   Physical Exam   Blood pressure 120/74, pulse 86, temperature 99 F (37.2 C), resp. rate 18, height 5\' 4"  (1.626 m), weight 196 lb 3.2 oz (88.996 kg), last menstrual period 09/13/2014, unknown  if currently breastfeeding.  Physical Exam  Nursing note and vitals reviewed. Constitutional: She is oriented to person, place, and time. She appears well-developed and well-nourished. No distress.  HENT:  Head: Normocephalic and atraumatic.  Cardiovascular: Normal rate.   Respiratory: Effort normal.  GI: Soft. She exhibits no distension and no mass. There is no tenderness. There is no rebound and no guarding.  Neurological: She is alert and oriented to person, place, and time.  Skin: Skin is warm and dry. No erythema.  Psychiatric: She has a normal mood and affect.     Results for orders  placed or performed during the hospital encounter of 12/13/14 (from the past 24 hour(s))  Urinalysis, Routine w reflex microscopic (not at [facility])     Status: Abnormal   Collection Time: 12/13/14 11:40 PM  Result Value Ref Range   Color, Urine YELLOW YELLOW   APPearance CLEAR CLEAR   Specific Gravity, Urine 1.015 1.005 - 1.030   pH 7.0 5.0 - 8.0   Glucose, UA NEGATIVE NEGATIVE mg/dL   Hgb urine dipstick TRACE (A) NEGATIVE   Bilirubin Urine NEGATIVE NEGATIVE   Ketones, ur NEGATIVE NEGATIVE mg/dL   Protein, ur NEGATIVE NEGATIVE mg/dL   Nitrite NEGATIVE NEGATIVE   Leukocytes, UA SMALL (A) NEGATIVE  Urine microscopic-add on     Status: Abnormal   Collection Time: 12/13/14 11:40 PM  Result Value Ref Range   Squamous Epithelial / LPF 0-5 (A) NONE SEEN   WBC, UA 0-5 0 - 5 WBC/hpf   RBC / HPF 0-5 0 - 5 RBC/hpf   Bacteria, UA FEW (A) NONE SEEN  Pregnancy, urine POC     Status: Abnormal   Collection Time: 12/13/14 11:42 PM  Result Value Ref Range   Preg Test, Ur POSITIVE (A) NEGATIVE     MAU Course  Procedures None  MDM +UPT UA today without evidence of significant dehydration Dr. Chestine Spore called ahead to MAU stating patient would require IV fluids and anti-emetics. No labs desired at this time.  IV LR with 25 mg Phenergan given in MAU. No episodes of emesis noted.  Patient continues to complain of headache. She is very drowsy from Phenergan. Will try Fioricet for headache.  Patient now rates headache pain at 5/10 ~ 30 minutes after Fioricet, originally pain was 10/10.  Assessment and Plan  A: SIUP at [redacted]w[redacted]d Nausea and vomiting in pregnancy prior to [redacted] weeks gestation Headache, migraine   P: Discharge home Rx for Phenergan given to patient Tylenol PRN for pain advised. If headaches continue to be frequent and unrelieved with Tylenol patient may need Rx from OB Diet for N/V in pregnancy discussed and included on AVS Advised increased PO hydration as tolerated Patient advised to  follow-up with [facility] as scheduled for routine prenatal care or sooner PRN Patient may return to MAU as needed or if her condition were to change or worsen   Marny Lowenstein, PA-C  12/14/2014, 2:29 AM

## 2014-12-14 NOTE — Discharge Instructions (Signed)
Morning Sickness °Morning sickness is when you feel sick to your stomach (nauseous) during pregnancy. You may feel sick to your stomach and throw up (vomit). You may feel sick in the morning, but you can feel this way any time of day. Some women feel very sick to their stomach and cannot stop throwing up (hyperemesis gravidarum). °HOME CARE °· Only take medicines as told by your doctor. °· Take multivitamins as told by your doctor. Taking multivitamins before getting pregnant can stop or lessen the harshness of morning sickness. °· Eat dry toast or unsalted crackers before getting out of bed. °· Eat 5 to 6 small meals a day. °· Eat dry and bland foods like rice and baked potatoes. °· Do not drink liquids with meals. Drink between meals. °· Do not eat greasy, fatty, or spicy foods. °· Have someone cook for you if the smell of food causes you to feel sick or throw up. °· If you feel sick to your stomach after taking prenatal vitamins, take them at night or with a snack. °· Eat protein when you need a snack (nuts, yogurt, cheese). °· Eat unsweetened gelatins for dessert. °· Wear a bracelet used for sea sickness (acupressure wristband). °· Go to a doctor that puts thin needles into certain body points (acupuncture) to improve how you feel. °· Do not smoke. °· Use a humidifier to keep the air in your house free of odors. °· Get lots of fresh air. °GET HELP IF: °· You need medicine to feel better. °· You feel dizzy or lightheaded. °· You are losing weight. °GET HELP RIGHT AWAY IF:  °· You feel very sick to your stomach and cannot stop throwing up. °· You pass out (faint). °MAKE SURE YOU: °· Understand these instructions. °· Will watch your condition. °· Will get help right away if you are not doing well or get worse. °  °This information is not intended to replace advice given to you by your health care provider. Make sure you discuss any questions you have with your health care provider. °  °Document Released: 02/12/2004  Document Revised: 01/25/2014 Document Reviewed: 06/21/2012 °Elsevier Interactive Patient Education ©2016 Elsevier Inc. °Eating Plan for Hyperemesis Gravidarum °Severe cases of hyperemesis gravidarum can lead to dehydration and malnutrition. The hyperemesis eating plan is one way to lessen the symptoms of nausea and vomiting. It is often used with prescribed medicines to control your symptoms.  °WHAT CAN I DO TO RELIEVE MY SYMPTOMS? °Listen to your body. Everyone is different and has different preferences. Find what works best for you. Some of the following things may help: °· Eat and drink slowly. °· Eat 5-6 small meals daily instead of 3 large meals.   °· Eat crackers before you get out of bed in the morning.   °· Starchy foods are usually well tolerated (such as cereal, toast, bread, potatoes, pasta, rice, and pretzels).   °· Ginger may help with nausea. Add ¼ tsp ground ginger to hot tea or choose ginger tea.   °· Try drinking 100% fruit juice or an electrolyte drink. °· Continue to take your prenatal vitamins as directed by your health care provider. If you are having trouble taking your prenatal vitamins, talk with your health care provider about different options. °· Include at least 1 serving of protein with your meals and snacks (such as meats or poultry, beans, nuts, eggs, or yogurt). Try eating a protein-rich snack before bed (such as cheese and crackers or a half turkey or peanut butter sandwich). °  WHAT THINGS SHOULD I AVOID TO REDUCE MY SYMPTOMS? The following things may help reduce your symptoms:  Avoid foods with strong smells. Try eating meals in well-ventilated areas that are free of odors.  Avoid drinking water or other beverages with meals. Try not to drink anything less than 30 minutes before and after meals.  Avoid drinking more than 1 cup of fluid at a time.  Avoid fried or high-fat foods, such as butter and cream sauces.  Avoid spicy foods.  Avoid skipping meals the best you can.  Nausea can be more intense on an empty stomach. If you cannot tolerate food at that time, do not force it. Try sucking on ice chips or other frozen items and make up the calories later.  Avoid lying down within 2 hours after eating.   This information is not intended to replace advice given to you by your health care provider. Make sure you discuss any questions you have with your health care provider.   Document Released: 11/01/2006 Document Revised: 01/09/2013 Document Reviewed: 11/08/2012 Elsevier Interactive Patient Education 2016 [employer]. Migraine Headache A migraine headache is an intense, throbbing pain on one or both sides of your head. A migraine can last for 30 minutes to several hours. CAUSES  The exact cause of a migraine headache is not always known. However, a migraine may be caused when nerves in the brain become irritated and release chemicals that cause inflammation. This causes pain. Certain things may also trigger migraines, such as:  Alcohol.  Smoking.  Stress.  Menstruation.  Aged cheeses.  Foods or drinks that contain nitrates, glutamate, aspartame, or tyramine.  Lack of sleep.  Chocolate.  Caffeine.  Hunger.  Physical exertion.  Fatigue.  Medicines used to treat chest pain (nitroglycerine), birth control pills, estrogen, and some blood pressure medicines. SIGNS AND SYMPTOMS  Pain on one or both sides of your head.  Pulsating or throbbing pain.  Severe pain that prevents daily activities.  Pain that is aggravated by any physical activity.  Nausea, vomiting, or both.  Dizziness.  Pain with exposure to bright lights, loud noises, or activity.  General sensitivity to bright lights, loud noises, or smells. Before you get a migraine, you may get warning signs that a migraine is coming (aura). An aura may include:  Seeing flashing lights.  Seeing bright spots, halos, or zigzag lines.  Having tunnel vision or blurred vision.  Having  feelings of numbness or tingling.  Having trouble talking.  Having muscle weakness. DIAGNOSIS  A migraine headache is often diagnosed based on:  Symptoms.  Physical exam.  A CT scan or MRI of your head. These imaging tests cannot diagnose migraines, but they can help rule out other causes of headaches. TREATMENT Medicines may be given for pain and nausea. Medicines can also be given to help prevent recurrent migraines.  HOME CARE INSTRUCTIONS  Only take over-the-counter or prescription medicines for pain or discomfort as directed by your health care provider. The use of long-term narcotics is not recommended.  Lie down in a dark, quiet room when you have a migraine.  Keep a journal to find out what may trigger your migraine headaches. For example, write down:  What you eat and drink.  How much sleep you get.  Any change to your diet or medicines.  Limit alcohol consumption.  Quit smoking if you smoke.  Get 7-9 hours of sleep, or as recommended by your health care provider.  Limit stress.  Keep lights dim if  bright lights bother you and make your migraines worse. SEEK IMMEDIATE MEDICAL CARE IF:   Your migraine becomes severe.  You have a fever.  You have a stiff neck.  You have vision loss.  You have muscular weakness or loss of muscle control.  You start losing your balance or have trouble walking.  You feel faint or pass out.  You have severe symptoms that are different from your first symptoms. MAKE SURE YOU:   Understand these instructions.  Will watch your condition.  Will get help right away if you are not doing well or get worse.   This information is not intended to replace advice given to you by your health care provider. Make sure you discuss any questions you have with your health care provider.   Document Released: 01/04/2005 Document Revised: 01/25/2014 Document Reviewed: 09/11/2012 Elsevier Interactive Patient Education [employer]2016 Elsevier  Inc.

## 2014-12-17 LAB — OB RESULTS CONSOLE HIV ANTIBODY (ROUTINE TESTING): HIV: NONREACTIVE

## 2014-12-17 LAB — OB RESULTS CONSOLE GC/CHLAMYDIA
Chlamydia: NEGATIVE
Gonorrhea: NEGATIVE

## 2014-12-17 LAB — OB RESULTS CONSOLE RUBELLA ANTIBODY, IGM: Rubella: IMMUNE

## 2014-12-17 LAB — OB RESULTS CONSOLE ABO/RH: RH Type: POSITIVE

## 2014-12-17 LAB — OB RESULTS CONSOLE HEPATITIS B SURFACE ANTIGEN: HEP B S AG: NEGATIVE

## 2014-12-17 LAB — OB RESULTS CONSOLE RPR: RPR: NONREACTIVE

## 2014-12-17 LAB — OB RESULTS CONSOLE ANTIBODY SCREEN: Antibody Screen: NEGATIVE

## 2015-01-19 NOTE — L&D Delivery Note (Signed)
Delivery Note Patient pushed for less than 10 minutes after she was noted to be C/C/+2. At 7:53 AM a viable and healthy female was delivered over an intact perineum via Vaginal, Spontaneous Delivery (Presentation: Left Occiput Anterior).  APGAR: 8, 9; weight pending .   Baby was placed on maternal abdomen.  Cord was double clamped and cut by father. Placenta status: Intact, Manual removal.  Cord: 3 vessels with the following complications: None.    Anesthesia: Epidural  Episiotomy: None Lacerations: None  Est. Blood Loss (mL): 200  Mom to postpartum.  Baby to Couplet care / Skin to Skin.  Essie HartINN, Shazia Mitchener STACIA 06/11/2015, 8:22 AM

## 2015-02-07 ENCOUNTER — Inpatient Hospital Stay (HOSPITAL_COMMUNITY)
Admission: AD | Admit: 2015-02-07 | Payer: Medicaid Other | Source: Ambulatory Visit | Admitting: Obstetrics and Gynecology

## 2015-04-09 ENCOUNTER — Encounter: Payer: BLUE CROSS/BLUE SHIELD | Attending: Obstetrics

## 2015-04-09 VITALS — Ht 64.0 in | Wt 198.3 lb

## 2015-04-09 DIAGNOSIS — O99810 Abnormal glucose complicating pregnancy: Secondary | ICD-10-CM | POA: Diagnosis not present

## 2015-04-09 DIAGNOSIS — O24419 Gestational diabetes mellitus in pregnancy, unspecified control: Secondary | ICD-10-CM

## 2015-04-15 NOTE — Progress Notes (Signed)
  Patient was seen on 04/09/15 for Gestational Diabetes self-management . The following learning objectives were met by the patient :   States the definition of Gestational Diabetes  States why dietary management is important in controlling blood glucose  Describes the effects of carbohydrates on blood glucose levels  Demonstrates ability to create a balanced meal plan  Demonstrates carbohydrate counting   States when to check blood glucose levels  Demonstrates proper blood glucose monitoring techniques  States the effect of stress and exercise on blood glucose levels  States the importance of limiting caffeine and abstaining from alcohol and smoking  Plan:  Aim for 2 Carb Choices per meal (30 grams) +/- 1 either way for breakfast Aim for 3 Carb Choices per meal (45 grams) +/- 1 either way from lunch and dinner Aim for 1-2 Carbs per snack Begin reading food labels for Total Carbohydrate and sugar grams of foods Consider  increasing your activity level by walking daily as tolerated Begin checking BG before breakfast and 1-2 hours after first bit of breakfast, lunch and dinner after  as directed by MD  Take medication  as directed by MD  Patient instructed to monitor glucose levels: FBS: 60 - <90 1 hour: <140 2 hour: <120  Patient received the following handouts:  Nutrition Diabetes and Pregnancy  Carbohydrate Counting List  Meal Planning worksheet  Patient will be seen for follow-up as needed.    

## 2015-04-22 ENCOUNTER — Encounter: Payer: Self-pay | Admitting: Obstetrics

## 2015-05-21 LAB — OB RESULTS CONSOLE GBS: GBS: NEGATIVE

## 2015-06-10 ENCOUNTER — Telehealth (HOSPITAL_COMMUNITY): Payer: Self-pay | Admitting: *Deleted

## 2015-06-10 ENCOUNTER — Encounter (HOSPITAL_COMMUNITY): Payer: Self-pay | Admitting: *Deleted

## 2015-06-10 NOTE — Telephone Encounter (Signed)
Preadmission screen  

## 2015-06-11 ENCOUNTER — Inpatient Hospital Stay (HOSPITAL_COMMUNITY)
Admission: AD | Admit: 2015-06-11 | Discharge: 2015-06-13 | DRG: 767 | Disposition: A | Discharge status: Home / Self Care | Payer: BLUE CROSS/BLUE SHIELD | Source: Ambulatory Visit | Attending: Obstetrics & Gynecology | Admitting: Obstetrics & Gynecology

## 2015-06-11 ENCOUNTER — Inpatient Hospital Stay (HOSPITAL_COMMUNITY): Payer: BLUE CROSS/BLUE SHIELD | Admitting: Anesthesiology

## 2015-06-11 ENCOUNTER — Encounter (HOSPITAL_COMMUNITY): Payer: Self-pay | Admitting: *Deleted

## 2015-06-11 DIAGNOSIS — O99284 Endocrine, nutritional and metabolic diseases complicating childbirth: Secondary | ICD-10-CM | POA: Diagnosis present

## 2015-06-11 DIAGNOSIS — Z833 Family history of diabetes mellitus: Secondary | ICD-10-CM

## 2015-06-11 DIAGNOSIS — Z3A38 38 weeks gestation of pregnancy: Secondary | ICD-10-CM

## 2015-06-11 DIAGNOSIS — Z8261 Family history of arthritis: Secondary | ICD-10-CM

## 2015-06-11 DIAGNOSIS — E039 Hypothyroidism, unspecified: Secondary | ICD-10-CM | POA: Diagnosis present

## 2015-06-11 DIAGNOSIS — O4290 Premature rupture of membranes, unspecified as to length of time between rupture and onset of labor, unspecified weeks of gestation: Secondary | ICD-10-CM | POA: Diagnosis present

## 2015-06-11 DIAGNOSIS — O24420 Gestational diabetes mellitus in childbirth, diet controlled: Principal | ICD-10-CM | POA: Diagnosis present

## 2015-06-11 HISTORY — DX: Premature rupture of membranes, unspecified as to length of time between rupture and onset of labor, unspecified weeks of gestation: O42.90

## 2015-06-11 LAB — TYPE AND SCREEN
ABO/RH(D): O POS
Antibody Screen: NEGATIVE

## 2015-06-11 LAB — CBC
HEMATOCRIT: 36.7 % (ref 36.0–46.0)
Hemoglobin: 13.1 g/dL (ref 12.0–15.0)
MCH: 31.4 pg (ref 26.0–34.0)
MCHC: 35.7 g/dL (ref 30.0–36.0)
MCV: 88 fL (ref 78.0–100.0)
PLATELETS: 213 10*3/uL (ref 150–400)
RBC: 4.17 MIL/uL (ref 3.87–5.11)
RDW: 14.6 % (ref 11.5–15.5)
WBC: 11.9 10*3/uL — AB (ref 4.0–10.5)

## 2015-06-11 LAB — GLUCOSE, RANDOM: GLUCOSE: 90 mg/dL (ref 65–99)

## 2015-06-11 LAB — RPR: RPR: NONREACTIVE

## 2015-06-11 MED ORDER — LIDOCAINE HCL (PF) 1 % IJ SOLN
INTRAMUSCULAR | Status: DC | PRN
  Administered 2015-06-11 (×2): 7 mL via EPIDURAL

## 2015-06-11 MED ORDER — EPHEDRINE 5 MG/ML INJ
10.0000 mg | INTRAVENOUS | Status: DC | PRN
  Filled 2015-06-11: qty 2

## 2015-06-11 MED ORDER — WITCH HAZEL-GLYCERIN EX PADS: 1.0000 "application " | MEDICATED_PAD | CUTANEOUS | Status: DC | PRN

## 2015-06-11 MED ORDER — LIDOCAINE HCL (PF) 1 % IJ SOLN
30.0000 mL | INTRAMUSCULAR | Status: DC | PRN
  Filled 2015-06-11: qty 30

## 2015-06-11 MED ORDER — ONDANSETRON HCL 4 MG/2ML IJ SOLN: 4.0000 mg | INTRAMUSCULAR | Status: DC | PRN

## 2015-06-11 MED ORDER — OXYTOCIN BOLUS FROM INFUSION
500.0000 mL | INTRAVENOUS | Status: DC
  Administered 2015-06-11: 500 mL via INTRAVENOUS

## 2015-06-11 MED ORDER — ACETAMINOPHEN 325 MG PO TABS: 650.0000 mg | ORAL_TABLET | ORAL | Status: DC | PRN

## 2015-06-11 MED ORDER — DIPHENHYDRAMINE HCL 50 MG/ML IJ SOLN: 12.5000 mg | INTRAMUSCULAR | Status: DC | PRN

## 2015-06-11 MED ORDER — PHENYLEPHRINE 40 MCG/ML (10ML) SYRINGE FOR IV PUSH (FOR BLOOD PRESSURE SUPPORT)
80.0000 ug | PREFILLED_SYRINGE | INTRAVENOUS | Status: DC | PRN
  Filled 2015-06-11: qty 5

## 2015-06-11 MED ORDER — ONDANSETRON HCL 4 MG/2ML IJ SOLN: 4.0000 mg | Freq: Four times a day (QID) | INTRAMUSCULAR | Status: DC | PRN

## 2015-06-11 MED ORDER — COCONUT OIL OIL: 1.0000 "application " | TOPICAL_OIL | Status: DC | PRN

## 2015-06-11 MED ORDER — PHENYLEPHRINE 40 MCG/ML (10ML) SYRINGE FOR IV PUSH (FOR BLOOD PRESSURE SUPPORT)
PREFILLED_SYRINGE | INTRAVENOUS | Status: AC
  Filled 2015-06-11: qty 20

## 2015-06-11 MED ORDER — FLEET ENEMA 7-19 GM/118ML RE ENEM: 1.0000 | ENEMA | RECTAL | Status: DC | PRN

## 2015-06-11 MED ORDER — LACTATED RINGERS IV SOLN
500.0000 mL | Freq: Once | INTRAVENOUS | Status: AC
  Administered 2015-06-11: 500 mL via INTRAVENOUS

## 2015-06-11 MED ORDER — FENTANYL 2.5 MCG/ML BUPIVACAINE 1/10 % EPIDURAL INFUSION (WH - ANES): 14.0000 mL/h | INTRAMUSCULAR | Status: DC | PRN

## 2015-06-11 MED ORDER — BENZOCAINE-MENTHOL 20-0.5 % EX AERO
1.0000 "application " | INHALATION_SPRAY | CUTANEOUS | Status: DC | PRN
  Administered 2015-06-12: 1 via TOPICAL
  Filled 2015-06-11 (×2): qty 56

## 2015-06-11 MED ORDER — IBUPROFEN 600 MG PO TABS
600.0000 mg | ORAL_TABLET | Freq: Four times a day (QID) | ORAL | Status: DC
  Administered 2015-06-11 – 2015-06-13 (×9): 600 mg via ORAL
  Filled 2015-06-11 (×9): qty 1

## 2015-06-11 MED ORDER — ZOLPIDEM TARTRATE 5 MG PO TABS
5.0000 mg | ORAL_TABLET | Freq: Every evening | ORAL | Status: DC | PRN
Start: 2015-06-11 — End: 2015-06-13

## 2015-06-11 MED ORDER — PRENATAL MULTIVITAMIN CH
1.0000 | ORAL_TABLET | Freq: Every day | ORAL | Status: DC
  Administered 2015-06-11 – 2015-06-13 (×3): 1 via ORAL
  Filled 2015-06-11 (×3): qty 1

## 2015-06-11 MED ORDER — OXYCODONE-ACETAMINOPHEN 5-325 MG PO TABS: 2.0000 | ORAL_TABLET | ORAL | Status: DC | PRN

## 2015-06-11 MED ORDER — LACTATED RINGERS IV SOLN: INTRAVENOUS | Status: DC

## 2015-06-11 MED ORDER — DIPHENHYDRAMINE HCL 25 MG PO CAPS: 25.0000 mg | ORAL_CAPSULE | Freq: Four times a day (QID) | ORAL | Status: DC | PRN

## 2015-06-11 MED ORDER — SIMETHICONE 80 MG PO CHEW
80.0000 mg | CHEWABLE_TABLET | ORAL | Status: DC | PRN
  Administered 2015-06-11: 80 mg via ORAL
  Filled 2015-06-11: qty 1

## 2015-06-11 MED ORDER — TETANUS-DIPHTH-ACELL PERTUSSIS 5-2.5-18.5 LF-MCG/0.5 IM SUSP: 0.5000 mL | Freq: Once | INTRAMUSCULAR | Status: DC

## 2015-06-11 MED ORDER — DIBUCAINE 1 % RE OINT: 1.0000 "application " | TOPICAL_OINTMENT | RECTAL | Status: DC | PRN

## 2015-06-11 MED ORDER — SENNOSIDES-DOCUSATE SODIUM 8.6-50 MG PO TABS
2.0000 | ORAL_TABLET | ORAL | Status: DC
  Administered 2015-06-11 – 2015-06-12 (×2): 2 via ORAL
  Filled 2015-06-11 (×2): qty 2

## 2015-06-11 MED ORDER — OXYCODONE-ACETAMINOPHEN 5-325 MG PO TABS
1.0000 | ORAL_TABLET | ORAL | Status: DC | PRN
  Administered 2015-06-11 – 2015-06-12 (×3): 1 via ORAL
  Filled 2015-06-11 (×3): qty 1

## 2015-06-11 MED ORDER — OXYTOCIN 40 UNITS IN LACTATED RINGERS INFUSION - SIMPLE MED
2.5000 [IU]/h | INTRAVENOUS | Status: DC
  Filled 2015-06-11: qty 1000

## 2015-06-11 MED ORDER — LACTATED RINGERS IV SOLN: 500.0000 mL | INTRAVENOUS | Status: DC | PRN

## 2015-06-11 MED ORDER — CITRIC ACID-SODIUM CITRATE 334-500 MG/5ML PO SOLN: 30.0000 mL | ORAL | Status: DC | PRN

## 2015-06-11 MED ORDER — ONDANSETRON HCL 4 MG PO TABS: 4.0000 mg | ORAL_TABLET | ORAL | Status: DC | PRN

## 2015-06-11 MED ORDER — FENTANYL 2.5 MCG/ML BUPIVACAINE 1/10 % EPIDURAL INFUSION (WH - ANES)
INTRAMUSCULAR | Status: AC
  Filled 2015-06-11: qty 125

## 2015-06-11 NOTE — Anesthesia Preprocedure Evaluation (Signed)
Anesthesia Evaluation  Patient identified by MRN, date of birth, ID band Patient awake    Reviewed: Allergy & Precautions, H&P , NPO status , Patient's Chart, lab work & pertinent test results  Airway Mallampati: I  TM Distance: >3 FB Neck ROM: full    Dental no notable dental hx.    Pulmonary neg pulmonary ROS,    Pulmonary exam normal        Cardiovascular negative cardio ROS Normal cardiovascular exam     Neuro/Psych negative psych ROS   GI/Hepatic negative GI ROS, Neg liver ROS,   Endo/Other  diabetes  Renal/GU negative Renal ROS     Musculoskeletal   Abdominal (+) + obese,   Peds  Hematology negative hematology ROS (+)   Anesthesia Other Findings   Reproductive/Obstetrics (+) Pregnancy                             Anesthesia Physical Anesthesia Plan  ASA: II  Anesthesia Plan: Epidural   Post-op Pain Management:    Induction:   Airway Management Planned:   Additional Equipment:   Intra-op Plan:   Post-operative Plan:   Informed Consent: I have reviewed the patients History and Physical, chart, labs and discussed the procedure including the risks, benefits and alternatives for the proposed anesthesia with the patient or authorized representative who has indicated his/her understanding and acceptance.     Plan Discussed with:   Anesthesia Plan Comments:         Anesthesia Quick Evaluation

## 2015-06-11 NOTE — Anesthesia Procedure Notes (Signed)
Epidural Patient location during procedure: OB Start time: 06/11/2015 7:27 AM End time: 06/11/2015 7:31 AM  Staffing Anesthesiologist: Leilani AbleHATCHETT, Justino Boze Performed by: anesthesiologist   Preanesthetic Checklist Completed: patient identified, surgical consent, pre-op evaluation, timeout performed, IV checked, risks and benefits discussed and monitors and equipment checked  Epidural Patient position: sitting Prep: site prepped and draped and DuraPrep Patient monitoring: continuous pulse ox and blood pressure Approach: midline Injection technique: LOR air  Needle:  Needle type: Tuohy  Needle gauge: 17 G Needle length: 9 cm and 9 Needle insertion depth: 6 cm Catheter type: closed end flexible Catheter size: 19 Gauge Test dose: negative and Other  Assessment Sensory level: T9 Events: blood not aspirated, injection not painful, no injection resistance, negative IV test and no paresthesia  Additional Notes Reason for block:procedure for pain

## 2015-06-11 NOTE — H&P (Signed)
Catherine BeringLisa M Schroeder is a 30 y.o. female presenting for active labor with painful contractions.  Water broke 5 am clear.  Patient denies vaginal bleeding, normal fetal movement.   Maternal Medical History:  Reason for admission: Rupture of membranes and contractions.  Nausea.  Contractions: Onset was 6-12 hours ago.   Frequency: regular.   Perceived severity is moderate.    Fetal activity: Perceived fetal activity is normal.   Last perceived fetal movement was within the past 12 hours.    Prenatal complications: A1GDM / Hypothyroidism  Prenatal Complications - Diabetes: gestational. Diabetes is managed by diet.      OB History    Gravida Para Term Preterm AB TAB SAB Ectopic Multiple Living   3 2 2       2      Past Medical History  Diagnosis Date  . Headache   . Hernia, ventral     MULTIPLE  . Hypothyroidism     during pregnancy only  . Gestational diabetes mellitus (GDM), antepartum   . Gestational diabetes     diet controlled   Past Surgical History  Procedure Laterality Date  . Eus  01/08/2011    Procedure: UPPER ENDOSCOPIC ULTRASOUND (EUS) RADIAL;  Surgeon: Rob Buntinganiel Jacobs, MD;  Location: [facility];  Service: Endoscopy;  Laterality: N/A;  Wants to look with radial EUS scope prior to ERCP  . Ercp  01/08/2011    Procedure: ENDOSCOPIC RETROGRADE CHOLANGIOPANCREATOGRAPHY (ERCP);  Surgeon: Rob Buntinganiel Jacobs, MD;  [facility];  Service: Endoscopy;  Laterality: N/A;  . Cholecystectomy  01/09/2011    Procedure: LAPAROSCOPIC CHOLECYSTECTOMY WITH INTRAOPERATIVE CHOLANGIOGRAM;  Surgeon: Ernestene MentionHaywood M Ingram, MD;  Location: [facility];  Service: General;  Laterality: N/A;  . Ventral hernia repair N/A 11/12/2013    Procedure: LAPAROSCOPIC MULTIPLE  VENTRAL HERNIAS POSSIBLE OPEN;  Surgeon: Claud KelpHaywood Ingram, MD;  Location: [facility];  Service: General;  Laterality: N/A;  . Insertion of mesh N/A 11/12/2013    Procedure: INSERTION OF MESH;  Surgeon: Claud KelpHaywood Ingram, MD;  Location: [facility];   Service: General;  Laterality: N/A;  . Hernia repair      above umbilicus   Family History: family history includes Arthritis in her brother; Diabetes in her maternal grandmother. Social History:  reports that she has never smoked. She has never used smokeless tobacco. She reports that she does not drink alcohol or use illicit drugs.   Prenatal Transfer Tool  Maternal Diabetes: Yes:  Diabetes Type:  Diet controlled Genetic Screening: Normal Maternal Ultrasounds/Referrals: Normal Fetal Ultrasounds or other Referrals:  Other: Normal anatomy scan Maternal Substance Abuse:  No Significant Maternal Medications:  Meds include: Syntroid Significant Maternal Lab Results:  Lab values include: Group B Strep negative Other Comments:  None  Review of Systems  Constitutional: Negative for fever and chills.  HENT: Negative for tinnitus.   Eyes: Negative for blurred vision and double vision.  Respiratory: Negative for cough.   Cardiovascular: Negative for chest pain.  Gastrointestinal: Negative for heartburn and nausea.  Genitourinary: Negative for dysuria.  Musculoskeletal: Negative for myalgias.  Skin: Negative for rash.  Neurological: Negative for dizziness and headaches.  Endo/Heme/Allergies: Does not bruise/bleed easily.  Psychiatric/Behavioral: Negative for depression.  All other systems reviewed and are negative.   Dilation: 10 Effacement (%): 100 Station: +3 Exam by:: s. Simona HuhEarl  Blood pressure 181/140, pulse 76, resp. rate 18, height 5\' 4"  (1.626 m), weight 90.719 kg (200 lb), last menstrual period 09/13/2014, SpO2 99 %, unknown if currently breastfeeding. Maternal Exam:  Uterine Assessment: Contraction strength is moderate.  Contraction duration is 60 seconds. Contraction frequency is regular.   Abdomen: Patient reports no abdominal tenderness. Fundal height is 38 cm.   Estimated fetal weight is 2900 grams.   Fetal presentation: vertex  Introitus: Normal vulva. Normal vagina.   Ferning test: positive.  Amniotic fluid character: clear.  Pelvis: adequate for delivery.   Cervix: Cervix evaluated by digital exam.   9cm  Fetal Exam Fetal Monitor Review: Baseline rate: 140.  Variability: moderate (6-25 bpm).   Pattern: accelerations present and no decelerations.    Fetal State Assessment: Category I - tracings are normal.     Physical Exam  Nursing note and vitals reviewed. Constitutional: She is oriented to person, place, and time. She appears well-developed and well-nourished.  HENT:  Head: Normocephalic and atraumatic.  Eyes: Pupils are equal, round, and reactive to light.  Neck: Normal range of motion.  Cardiovascular: Normal rate and regular rhythm.   Respiratory: Effort normal.  GI: Soft.  Genitourinary: Vagina normal.  Musculoskeletal: Normal range of motion.  Neurological: She is alert and oriented to person, place, and time. She has normal reflexes.  Skin: Skin is warm.    Prenatal labs: ABO, Rh: O/Positive/-- (11/29 0000) Antibody: Negative (11/29 0000) Rubella: Immune (11/29 0000) RPR: Nonreactive (11/29 0000)  HBsAg: Negative (11/29 0000)  HIV: Non-reactive (11/29 0000)  GBS: Negative (05/03 0000)   Assessment/Plan: 30 yo G3P2 at 38 weeks 2 days in active labor Epidural Anticipate NSVD   Catherine Schroeder STACIA 06/11/2015, 8:17 AM

## 2015-06-11 NOTE — Lactation Note (Signed)
This note was copied from a baby's chart. Lactation Consultation Note: Experienced BF mom getting ready to latch baby as I went in to room. I assisted her with pillows and she easily latched baby by herself. BF brochure given with resources for support after DC. No questions at present. To call for assist prn  Patient Name: Catherine Schroeder'UToday's Date: 06/11/2015 Reason for consult: Initial assessment   Maternal Data Formula Feeding for Exclusion: No Has patient been taught Hand Expression?: Yes Does the patient have breastfeeding experience prior to this delivery?: Yes  Feeding Feeding Type: Breast Fed Length of feed: 25 min  LATCH Score/Interventions Latch: Grasps breast easily, tongue down, lips flanged, rhythmical sucking.  Audible Swallowing: A few with stimulation  Type of Nipple: Everted at rest and after stimulation  Comfort (Breast/Nipple): Soft / non-tender     Hold (Positioning): No assistance needed to correctly position infant at breast.  LATCH Score: 9  Lactation Tools Discussed/Used     Consult Status Consult Status: PRN    Pamelia HoitWeeks, Dylanie Quesenberry D 06/11/2015, 1:52 PM

## 2015-06-11 NOTE — Anesthesia Postprocedure Evaluation (Signed)
Anesthesia Post Note  Patient: Catherine BeringLisa M Schroeder  Procedure(s) Performed: * No procedures listed *  Patient location during evaluation: Mother Baby Anesthesia Type: Epidural Level of consciousness: awake and alert and oriented Pain management: satisfactory to patient Vital Signs Assessment: post-procedure vital signs reviewed and stable Respiratory status: spontaneous breathing and nonlabored ventilation Cardiovascular status: stable Postop Assessment: no headache, no backache, no signs of nausea or vomiting, adequate PO intake and patient able to bend at knees (patient up walking) Anesthetic complications: no Comments: Patient stated that she was 10 cm when she received her epidural and it worked, "very little", but she is overall happy with experience.     Last Vitals:  Filed Vitals:   06/11/15 1030 06/11/15 1130  BP: 108/67 121/69  Pulse: 87 74  Temp:    Resp: 20 20    Last Pain:  Filed Vitals:   06/11/15 1145  PainSc: 7    Pain Goal:                 Madison HickmanGREGORY,Sebastion Jun

## 2015-06-11 NOTE — Progress Notes (Signed)
Notified of pt arrival in MAU, SROM and advanced dilation. Will come to hospital for delivery. Routine orders placed with epidural per pt request

## 2015-06-12 ENCOUNTER — Encounter (HOSPITAL_COMMUNITY): Payer: Self-pay

## 2015-06-12 LAB — CBC
HCT: 34.1 % — ABNORMAL LOW (ref 36.0–46.0)
HEMOGLOBIN: 11.9 g/dL — AB (ref 12.0–15.0)
MCH: 31.1 pg (ref 26.0–34.0)
MCHC: 34.9 g/dL (ref 30.0–36.0)
MCV: 89 fL (ref 78.0–100.0)
PLATELETS: 191 10*3/uL (ref 150–400)
RBC: 3.83 MIL/uL — AB (ref 3.87–5.11)
RDW: 14.9 % (ref 11.5–15.5)
WBC: 16.3 10*3/uL — ABNORMAL HIGH (ref 4.0–10.5)

## 2015-06-12 NOTE — Discharge Summary (Signed)
Obstetric Discharge Summary Reason for Admission: onset of labor Prenatal Procedures: none Intrapartum Procedures: spontaneous vaginal delivery Postpartum Procedures: none Complications-Operative and Postpartum: none HEMOGLOBIN  Date Value Ref Range Status  06/12/2015 11.9* 12.0 - 15.0 g/dL Final   HCT  Date Value Ref Range Status  06/12/2015 34.1* 36.0 - 46.0 % Final    Discharge Diagnoses: Term Pregnancy-delivered  Discharge Information: Date: 06/12/2015 Activity: pelvic rest Diet: routine Medications: Ibuprofen Condition: stable Instructions: refer to practice specific booklet Discharge to: home Follow-up Information    Follow up with Catrena Vari A, MD In 4 weeks.   Specialty:  Obstetrics and Gynecology   Contact information:   [address] [place] [address] [phone]       Newborn Data: Live born female  Birth Weight: 6 lb 15.5 oz (3160 g) APGAR: 8, 9  Home with mother.  Alanee Ting A 06/12/2015, 7:51 AM

## 2015-06-12 NOTE — Progress Notes (Signed)
Circ in office.

## 2015-06-12 NOTE — Lactation Note (Signed)
This note was copied from a baby's chart. Lactation Consultation Note Baby had 6% weight loss. Asked tech to reweigh baby, then increased to  7% weight loss. Baby has been BF well w/WDL of output. Visited mom but was sleeping. LC needs to see moms colostrum and do oral assessment on baby.  Patient Name: Catherine Clare CharonLisa Silvestri VHQIO'NToday's Date: 06/12/2015 Reason for consult: Follow-up assessment;Infant weight loss   Maternal Data    Feeding Feeding Type: Breast Fed Length of feed: 20 min  LATCH Score/Interventions Latch: Grasps breast easily, tongue down, lips flanged, rhythmical sucking.  Audible Swallowing: A few with stimulation  Type of Nipple: Everted at rest and after stimulation  Comfort (Breast/Nipple): Soft / non-tender     Hold (Positioning): No assistance needed to correctly position infant at breast.  LATCH Score: 9  Lactation Tools Discussed/Used     Consult Status Consult Status: Follow-up Date: 06/12/15 Follow-up type: In-patient    Shaquira Moroz, Diamond NickelLAURA G 06/12/2015, 2:50 AM

## 2015-06-12 NOTE — Progress Notes (Signed)
Patient is eating, ambulating, voiding.  Pain control is good.  Filed Vitals:   06/11/15 1030 06/11/15 1130 06/11/15 1530 06/12/15 0626  BP: 108/67 121/69 118/71 111/69  Pulse: 87 74 71 74  Temp:    98.1 F (36.7 C)  TempSrc: Oral Oral Oral Oral  Resp: 20 20 18 20   Height:      Weight:      SpO2:        Fundus firm Perineum without swelling.  Lab Results  Component Value Date   WBC 16.3* 06/12/2015   HGB 11.9* 06/12/2015   HCT 34.1* 06/12/2015   MCV 89.0 06/12/2015   PLT 191 06/12/2015    --/--/O POS (05/24 0821)/RI  A/P Post partum day 1.  Routine care.  Expect d/c routine.    Nahia Nissan A

## 2015-06-13 NOTE — Lactation Note (Addendum)
This note was copied from a baby's chart. Lactation Consultation Note  P3, Ex BF.  9.5% weight loss.  Mother has started pumping in addition to breastfeeding. Mother pumped 5 ml.  Taught mother how to syringe finger feed.  Baby took 5ml. Recommend until weight stabilizes to continue to breastfeed on demand and post pump afterwards 4-5x day for 10-15 min. Give baby supplementatl breastmilk after next feeding. Mother has DEBP at home.  Also provided her w/ manual breastpump. Reviewed engorgement care and monitoring voids/stools.    Patient Name: Catherine Schroeder CharonLisa Rhem RUEAV'WToday's Date: 06/13/2015 Reason for consult: Infant weight loss   Maternal Data    Feeding Feeding Type: Breast Fed Length of feed: 30 min  LATCH Score/Interventions                      Lactation Tools Discussed/Used     Consult Status Consult Status: Complete    Hardie PulleyBerkelhammer, Sangita Zani Boschen 06/13/2015, 10:08 AM

## 2015-06-13 NOTE — Progress Notes (Signed)
Patient is eating, ambulating, voiding.  Pain control is good.  Appropriate lochia, no complaints.  Filed Vitals:   06/11/15 1530 06/12/15 0626 06/12/15 1800 06/13/15 0500  BP: 118/71 111/69 124/74 111/66  Pulse: 71 74 78 84  Temp:  98.1 F (36.7 C) 98.7 F (37.1 C) 98.3 F (36.8 C)  TempSrc: Oral Oral Oral Oral  Resp: 18 20 18 18   Height:      Weight:      SpO2:        Fundus firm Perineum without swelling. Ext: no CT  Lab Results  Component Value Date   WBC 16.3* 06/12/2015   HGB 11.9* 06/12/2015   HCT 34.1* 06/12/2015   MCV 89.0 06/12/2015   PLT 191 06/12/2015    --/--/O POS (05/24 [id number])  A/P Post partum day 2.  Routine care.  Expect d/c today.    Philip AspenALLAHAN, Etheline Geppert

## 2015-06-20 ENCOUNTER — Inpatient Hospital Stay (HOSPITAL_COMMUNITY): Admission: RE | Admit: 2015-06-20 | Payer: BLUE CROSS/BLUE SHIELD | Source: Ambulatory Visit

## 2015-08-22 ENCOUNTER — Other Ambulatory Visit: Payer: Self-pay | Admitting: Internal Medicine

## 2015-08-22 ENCOUNTER — Ambulatory Visit
Admission: RE | Admit: 2015-08-22 | Discharge: 2015-08-22 | Disposition: A | Discharge status: Home / Self Care | Payer: BLUE CROSS/BLUE SHIELD | Source: Ambulatory Visit | Attending: Internal Medicine | Admitting: Internal Medicine

## 2015-08-22 DIAGNOSIS — H539 Unspecified visual disturbance: Secondary | ICD-10-CM

## 2015-08-22 DIAGNOSIS — R42 Dizziness and giddiness: Secondary | ICD-10-CM

## 2015-08-22 DIAGNOSIS — R269 Unspecified abnormalities of gait and mobility: Secondary | ICD-10-CM

## 2016-05-28 ENCOUNTER — Other Ambulatory Visit: Payer: Self-pay | Admitting: Obstetrics and Gynecology

## 2016-05-28 DIAGNOSIS — N644 Mastodynia: Secondary | ICD-10-CM

## 2016-06-10 ENCOUNTER — Ambulatory Visit
Admission: RE | Admit: 2016-06-10 | Discharge: 2016-06-10 | Disposition: A | Discharge status: Home / Self Care | Payer: BLUE CROSS/BLUE SHIELD | Source: Ambulatory Visit | Attending: Obstetrics and Gynecology | Admitting: Obstetrics and Gynecology

## 2016-06-10 ENCOUNTER — Other Ambulatory Visit: Payer: Self-pay | Admitting: Obstetrics and Gynecology

## 2016-06-10 DIAGNOSIS — N644 Mastodynia: Secondary | ICD-10-CM

## 2017-06-16 DIAGNOSIS — N925 Other specified irregular menstruation: Secondary | ICD-10-CM | POA: Diagnosis not present

## 2017-06-16 DIAGNOSIS — Z3201 Encounter for pregnancy test, result positive: Secondary | ICD-10-CM | POA: Diagnosis not present

## 2017-06-16 DIAGNOSIS — Z6837 Body mass index (BMI) 37.0-37.9, adult: Secondary | ICD-10-CM | POA: Diagnosis not present

## 2017-06-16 DIAGNOSIS — Z124 Encounter for screening for malignant neoplasm of cervix: Secondary | ICD-10-CM | POA: Diagnosis not present

## 2017-06-16 DIAGNOSIS — Z3480 Encounter for supervision of other normal pregnancy, unspecified trimester: Secondary | ICD-10-CM | POA: Diagnosis not present

## 2017-07-07 DIAGNOSIS — R197 Diarrhea, unspecified: Secondary | ICD-10-CM | POA: Diagnosis not present

## 2017-07-19 DIAGNOSIS — Z369 Encounter for antenatal screening, unspecified: Secondary | ICD-10-CM | POA: Diagnosis not present

## 2017-07-19 DIAGNOSIS — O3680X9 Pregnancy with inconclusive fetal viability, other fetus: Secondary | ICD-10-CM | POA: Diagnosis not present

## 2017-07-26 DIAGNOSIS — Z3480 Encounter for supervision of other normal pregnancy, unspecified trimester: Secondary | ICD-10-CM | POA: Diagnosis not present

## 2017-07-26 DIAGNOSIS — Z3682 Encounter for antenatal screening for nuchal translucency: Secondary | ICD-10-CM | POA: Diagnosis not present

## 2017-07-26 DIAGNOSIS — Z8632 Personal history of gestational diabetes: Secondary | ICD-10-CM | POA: Diagnosis not present

## 2017-07-26 DIAGNOSIS — E039 Hypothyroidism, unspecified: Secondary | ICD-10-CM | POA: Diagnosis not present

## 2017-07-26 DIAGNOSIS — Z369 Encounter for antenatal screening, unspecified: Secondary | ICD-10-CM | POA: Diagnosis not present

## 2017-08-03 DIAGNOSIS — Z369 Encounter for antenatal screening, unspecified: Secondary | ICD-10-CM | POA: Diagnosis not present

## 2017-08-30 DIAGNOSIS — E039 Hypothyroidism, unspecified: Secondary | ICD-10-CM | POA: Diagnosis not present

## 2017-08-30 DIAGNOSIS — Z363 Encounter for antenatal screening for malformations: Secondary | ICD-10-CM | POA: Diagnosis not present

## 2017-09-06 ENCOUNTER — Encounter (HOSPITAL_COMMUNITY): Payer: Self-pay

## 2017-09-06 ENCOUNTER — Inpatient Hospital Stay (HOSPITAL_COMMUNITY)
Admission: AD | Admit: 2017-09-06 | Discharge: 2017-09-06 | Disposition: A | Discharge status: Home / Self Care | Payer: BLUE CROSS/BLUE SHIELD | Source: Ambulatory Visit | Attending: Obstetrics and Gynecology | Admitting: Obstetrics and Gynecology

## 2017-09-06 DIAGNOSIS — R51 Headache: Secondary | ICD-10-CM | POA: Insufficient documentation

## 2017-09-06 DIAGNOSIS — Z3A18 18 weeks gestation of pregnancy: Secondary | ICD-10-CM

## 2017-09-06 DIAGNOSIS — O26892 Other specified pregnancy related conditions, second trimester: Secondary | ICD-10-CM | POA: Diagnosis not present

## 2017-09-06 LAB — COMPREHENSIVE METABOLIC PANEL
ALK PHOS: 63 U/L (ref 38–126)
ALT: 21 U/L (ref 0–44)
AST: 27 U/L (ref 15–41)
Albumin: 3.7 g/dL (ref 3.5–5.0)
Anion gap: 11 (ref 5–15)
BILIRUBIN TOTAL: 0.7 mg/dL (ref 0.3–1.2)
BUN: 9 mg/dL (ref 6–20)
CALCIUM: 9.2 mg/dL (ref 8.9–10.3)
CO2: 21 mmol/L — ABNORMAL LOW (ref 22–32)
Chloride: 104 mmol/L (ref 98–111)
Creatinine, Ser: 0.58 mg/dL (ref 0.44–1.00)
GLUCOSE: 90 mg/dL (ref 70–99)
POTASSIUM: 3.9 mmol/L (ref 3.5–5.1)
Sodium: 136 mmol/L (ref 135–145)
TOTAL PROTEIN: 6.9 g/dL (ref 6.5–8.1)

## 2017-09-06 LAB — CBC WITH DIFFERENTIAL/PLATELET
Basophils Absolute: 0 10*3/uL (ref 0.0–0.1)
Basophils Relative: 0 %
Eosinophils Absolute: 0.1 10*3/uL (ref 0.0–0.7)
Eosinophils Relative: 1 %
HEMATOCRIT: 34.4 % — AB (ref 36.0–46.0)
HEMOGLOBIN: 11.9 g/dL — AB (ref 12.0–15.0)
LYMPHS ABS: 4 10*3/uL (ref 0.7–4.0)
Lymphocytes Relative: 34 %
MCH: 30.9 pg (ref 26.0–34.0)
MCHC: 34.6 g/dL (ref 30.0–36.0)
MCV: 89.4 fL (ref 78.0–100.0)
MONO ABS: 0.4 10*3/uL (ref 0.1–1.0)
MONOS PCT: 3 %
NEUTROS ABS: 7.2 10*3/uL (ref 1.7–7.7)
NEUTROS PCT: 62 %
Platelets: 262 10*3/uL (ref 150–400)
RBC: 3.85 MIL/uL — ABNORMAL LOW (ref 3.87–5.11)
RDW: 14.3 % (ref 11.5–15.5)
WBC: 11.6 10*3/uL — ABNORMAL HIGH (ref 4.0–10.5)

## 2017-09-06 LAB — URINALYSIS, ROUTINE W REFLEX MICROSCOPIC
BILIRUBIN URINE: NEGATIVE
GLUCOSE, UA: NEGATIVE mg/dL
Ketones, ur: NEGATIVE mg/dL
LEUKOCYTES UA: NEGATIVE
Nitrite: NEGATIVE
PH: 7 (ref 5.0–8.0)
Protein, ur: NEGATIVE mg/dL
SPECIFIC GRAVITY, URINE: 1.01 (ref 1.005–1.030)

## 2017-09-06 MED ORDER — PROMETHAZINE HCL 25 MG/ML IJ SOLN
25.0000 mg | Freq: Once | INTRAMUSCULAR | Status: AC
Start: 2017-09-06 — End: 2017-09-06
  Administered 2017-09-06: 25 mg via INTRAVENOUS
  Filled 2017-09-06: qty 1

## 2017-09-06 MED ORDER — BUTALBITAL-APAP-CAFFEINE 50-325-40 MG PO TABS: 1.0000 | ORAL_TABLET | Freq: Four times a day (QID) | ORAL | 0 refills | Status: DC | PRN

## 2017-09-06 MED ORDER — DIPHENHYDRAMINE HCL 50 MG/ML IJ SOLN
25.0000 mg | Freq: Once | INTRAMUSCULAR | Status: AC
  Administered 2017-09-06: 25 mg via INTRAVENOUS
  Filled 2017-09-06: qty 1

## 2017-09-06 MED ORDER — ONDANSETRON 8 MG PO TBDP: 8.0000 mg | ORAL_TABLET | Freq: Three times a day (TID) | ORAL | 0 refills | Status: DC | PRN

## 2017-09-06 MED ORDER — LACTATED RINGERS IV BOLUS
1000.0000 mL | Freq: Once | INTRAVENOUS | Status: AC
  Administered 2017-09-06: 1000 mL via INTRAVENOUS

## 2017-09-06 MED ORDER — DEXAMETHASONE SODIUM PHOSPHATE 10 MG/ML IJ SOLN
10.0000 mg | Freq: Once | INTRAMUSCULAR | Status: AC
  Administered 2017-09-06: 10 mg via INTRAVENOUS
  Filled 2017-09-06: qty 1

## 2017-09-06 NOTE — MAU Provider Note (Signed)
History     CSN: [id number]  Arrival date and time: 09/06/17 1900   First Provider Initiated Contact with Patient 09/06/17 2025      Chief Complaint  Patient presents with  . Headache   Headache   This is a new problem. The current episode started yesterday. The problem occurs constantly. The problem has been unchanged. The pain is located in the bilateral region. The pain does not radiate. The pain quality is similar to prior headaches. The quality of the pain is described as dull. The pain is at a severity of 10/10. Associated symptoms include nausea. Pertinent negatives include no fever or vomiting. Nothing aggravates the symptoms. She has tried acetaminophen for the symptoms. The treatment provided no relief. Her past medical history is significant for migraine headaches.    OB History    Gravida  4   Para  3   Term  3   Preterm      AB      Living  3     SAB      TAB      Ectopic      Multiple  0   Live Births  3           Past Medical History:  Diagnosis Date  . Gestational diabetes    diet controlled  . Gestational diabetes mellitus (GDM), antepartum   . Headache   . Hernia, ventral    MULTIPLE  . Hypothyroidism    during pregnancy only    Past Surgical History:  Procedure Laterality Date  . CHOLECYSTECTOMY  01/09/2011   Procedure: LAPAROSCOPIC CHOLECYSTECTOMY WITH INTRAOPERATIVE CHOLANGIOGRAM;  Surgeon: Ernestene Mention, MD;  Location: [facility];  Service: General;  Laterality: N/A;  . ERCP  01/08/2011   Procedure: ENDOSCOPIC RETROGRADE CHOLANGIOPANCREATOGRAPHY (ERCP);  Surgeon: Rob Bunting, MD;  [facility];  Service: Endoscopy;  Laterality: N/A;  . EUS  01/08/2011   Procedure: UPPER ENDOSCOPIC ULTRASOUND (EUS) RADIAL;  Surgeon: Rob Bunting, MD;  Location: [facility];  Service: Endoscopy;  Laterality: N/A;  Wants to look with radial EUS scope prior to ERCP  . HERNIA REPAIR     above umbilicus  . INSERTION OF MESH N/A  11/12/2013   Procedure: INSERTION OF MESH;  Surgeon: Claud Kelp, MD;  Location: [facility];  Service: General;  Laterality: N/A;  . VENTRAL HERNIA REPAIR N/A 11/12/2013   Procedure: LAPAROSCOPIC MULTIPLE  VENTRAL HERNIAS POSSIBLE OPEN;  Surgeon: Claud Kelp, MD;  Location: [facility];  Service: General;  Laterality: N/A;    Family History  Problem Relation Age of Onset  . Arthritis Brother   . Diabetes Maternal Grandmother     Social History   Tobacco Use  . Smoking status: Never Smoker  . Smokeless tobacco: Never Used  Substance Use Topics  . Alcohol use: No  . Drug use: No    Allergies: No Known Allergies  Medications Prior to Admission  Medication Sig Dispense Refill Last Dose  . famotidine (PEPCID) 40 MG tablet Take 40 mg by mouth daily.   09/05/2017 at Unknown time  . levothyroxine (SYNTHROID, LEVOTHROID) 25 MCG tablet Take 25 mcg by mouth daily before breakfast. Pt thinks dose is 25   09/06/2017 at Unknown time  . ondansetron (ZOFRAN-ODT) 4 MG disintegrating tablet Take 4 mg by mouth every 8 (eight) hours as needed for nausea or vomiting.   09/05/2017 at Unknown time  . prenatal vitamin w/FE, FA (PRENATAL 1 + 1) 27-1 MG TABS Take  1 tablet by mouth every morning.    09/06/2017 at Unknown time  . promethazine (PHENERGAN) 12.5 MG tablet Take 1 tablet (12.5 mg total) by mouth every 6 (six) hours as needed for nausea or vomiting. (Patient not taking: Reported on 06/11/2015) 30 tablet 0 Not Taking at Unknown time    Review of Systems  Constitutional: Negative for chills and fever.  Gastrointestinal: Positive for nausea. Negative for vomiting.  Neurological: Positive for headaches.   Physical Exam   Blood pressure 133/86, pulse 89, temperature 98.6 F (37 C), temperature source Oral, resp. rate 16, height 5\' 4"  (1.626 m), weight 98.9 kg, SpO2 100 %, currently breastfeeding.  Physical Exam  Nursing note and vitals reviewed. Constitutional: She is oriented to person, place, and  time. She appears well-developed and well-nourished. No distress.  HENT:  Head: Normocephalic.  Cardiovascular: Normal rate.  Respiratory: Effort normal.  GI: Soft. There is no tenderness.  Genitourinary:  Genitourinary Comments: FHT with 163 doppler   Neurological: She is alert and oriented to person, place, and time.  Skin: Skin is warm and dry.  Psychiatric: She has a normal mood and affect.     Results for orders placed or performed during the hospital encounter of 09/06/17 (from the past 24 hour(s))  Urinalysis, Routine w reflex microscopic     Status: Abnormal   Collection Time: 09/06/17  8:17 PM  Result Value Ref Range   Color, Urine YELLOW YELLOW   APPearance CLEAR CLEAR   Specific Gravity, Urine 1.010 1.005 - 1.030   pH 7.0 5.0 - 8.0   Glucose, UA NEGATIVE NEGATIVE mg/dL   Hgb urine dipstick SMALL (A) NEGATIVE   Bilirubin Urine NEGATIVE NEGATIVE   Ketones, ur NEGATIVE NEGATIVE mg/dL   Protein, ur NEGATIVE NEGATIVE mg/dL   Nitrite NEGATIVE NEGATIVE   Leukocytes, UA NEGATIVE NEGATIVE   RBC / HPF 0-5 0 - 5 RBC/hpf   WBC, UA 0-5 0 - 5 WBC/hpf   Bacteria, UA RARE (A) NONE SEEN   Squamous Epithelial / LPF 0-5 0 - 5   Mucus PRESENT   CBC with Differential/Platelet     Status: Abnormal   Collection Time: 09/06/17  8:53 PM  Result Value Ref Range   WBC 11.6 (H) 4.0 - 10.5 K/uL   RBC 3.85 (L) 3.87 - 5.11 MIL/uL   Hemoglobin 11.9 (L) 12.0 - 15.0 g/dL   HCT 65.734.4 (L) 84.636.0 - 96.246.0 %   MCV 89.4 78.0 - 100.0 fL   MCH 30.9 26.0 - 34.0 pg   MCHC 34.6 30.0 - 36.0 g/dL   RDW 95.214.3 84.111.5 - 32.415.5 %   Platelets 262 150 - 400 K/uL   Neutrophils Relative % 62 %   Neutro Abs 7.2 1.7 - 7.7 K/uL   Lymphocytes Relative 34 %   Lymphs Abs 4.0 0.7 - 4.0 K/uL   Monocytes Relative 3 %   Monocytes Absolute 0.4 0.1 - 1.0 K/uL   Eosinophils Relative 1 %   Eosinophils Absolute 0.1 0.0 - 0.7 K/uL   Basophils Relative 0 %   Basophils Absolute 0.0 0.0 - 0.1 K/uL  Comprehensive metabolic panel      Status: Abnormal   Collection Time: 09/06/17  8:53 PM  Result Value Ref Range   Sodium 136 135 - 145 mmol/L   Potassium 3.9 3.5 - 5.1 mmol/L   Chloride 104 98 - 111 mmol/L   CO2 21 (L) 22 - 32 mmol/L   Glucose, Bld 90 70 - 99 mg/dL  BUN 9 6 - 20 mg/dL   Creatinine, Ser 0.450.58 0.44 - 1.00 mg/dL   Calcium 9.2 8.9 - 40.910.3 mg/dL   Total Protein 6.9 6.5 - 8.1 g/dL   Albumin 3.7 3.5 - 5.0 g/dL   AST 27 15 - 41 U/L   ALT 21 0 - 44 U/L   Alkaline Phosphatase 63 38 - 126 U/L   Total Bilirubin 0.7 0.3 - 1.2 mg/dL   GFR calc non Af Amer >60 >60 mL/min   GFR calc Af Amer >60 >60 mL/min   Anion gap 11 5 - 15    MAU Course  Procedures  MDM Patient has had migraine cocktail with LR bolus, Decadron, benadryl and phenergan. She reports that her headache has improved. She is able to tolerate PO as well.   Assessment and Plan   1. Pregnancy headache in second trimester   2. [redacted] weeks gestation of pregnancy    DC home Comfort measures reviewed  2nd/3rd Trimester precautions  PTL precautions  Fetal kick counts RX: zofran 8mg  ODT PRN, Fioricet PRN #[id number]  Return to MAU as needed FU with OB as planned  Follow-up Information    Levi AlandAnderson, Mark E, MD Follow up.   Specialty:  Obstetrics and Gynecology Contact information: [address] [place] [phone]            Thressa ShellerHeather Hogan 09/06/2017, 8:28 PM

## 2017-09-06 NOTE — Discharge Instructions (Signed)
Second Trimester of Pregnancy The second trimester is from week 13 through week 28, month 4 through 6. This is often the time in pregnancy that you feel your best. Often times, morning sickness has lessened or quit. You may have more energy, and you may get hungry more often. Your unborn baby (fetus) is growing rapidly. At the end of the sixth month, he or she is about 9 inches long and weighs about 1 pounds. You will likely feel the baby move (quickening) between 18 and 20 weeks of pregnancy. Follow these instructions at home:  Avoid all smoking, herbs, and alcohol. Avoid drugs not approved by your doctor.  Do not use any tobacco products, including cigarettes, chewing tobacco, and electronic cigarettes. If you need help quitting, ask your doctor. You may get counseling or other support to help you quit.  Only take medicine as told by your doctor. Some medicines are safe and some are not during pregnancy.  Exercise only as told by your doctor. Stop exercising if you start having cramps.  Eat regular, healthy meals.  Wear a good support bra if your breasts are tender.  Do not use hot tubs, steam rooms, or saunas.  Wear your seat belt when driving.  Avoid raw meat, uncooked cheese, and liter boxes and soil used by cats.  Take your prenatal vitamins.  Take 1500-2000 milligrams of calcium daily starting at the 20th week of pregnancy until you deliver your baby.  Try taking medicine that helps you poop (stool softener) as needed, and if your doctor approves. Eat more fiber by eating fresh fruit, vegetables, and whole grains. Drink enough fluids to keep your pee (urine) clear or pale yellow.  Take warm water baths (sitz baths) to soothe pain or discomfort caused by hemorrhoids. Use hemorrhoid cream if your doctor approves.  If you have puffy, bulging veins (varicose veins), wear support hose. Raise (elevate) your feet for 15 minutes, 3-4 times a day. Limit salt in your diet.  Avoid heavy  lifting, wear low heals, and sit up straight.  Rest with your legs raised if you have leg cramps or low back pain.  Visit your dentist if you have not gone during your pregnancy. Use a soft toothbrush to brush your teeth. Be gentle when you floss.  You can have sex (intercourse) unless your doctor tells you not to.  Go to your doctor visits. Get help if:  You feel dizzy.  You have mild cramps or pressure in your lower belly (abdomen).  You have a nagging pain in your belly area.  You continue to feel sick to your stomach (nauseous), throw up (vomit), or have watery poop (diarrhea).  You have bad smelling fluid coming from your vagina.  You have pain with peeing (urination). Get help right away if:  You have a fever.  You are leaking fluid from your vagina.  You have spotting or bleeding from your vagina.  You have severe belly cramping or pain.  You lose or gain weight rapidly.  You have trouble catching your breath and have chest pain.  You notice sudden or extreme puffiness (swelling) of your face, hands, ankles, feet, or legs.  You have not felt the baby move in over an hour.  You have severe headaches that do not go away with medicine.  You have vision changes. This information is not intended to replace advice given to you by your health care provider. Make sure you discuss any questions you have with your health care   provider. Document Released: 03/31/2009 Document Revised: 06/12/2015 Document Reviewed: 03/07/2012 Elsevier Interactive Patient Education  2017 Elsevier Inc.  

## 2017-09-06 NOTE — MAU Note (Signed)
Pt reports HA all day today. States there is pressure all in her over her head. States that she has only been able to drink 2 cups of water today because her head hurts so much and she's nausea. Pt denies vomiting. Pt has a history of migraines. Pt states she last took 2 extra strengthTylenol this morning at 0900. States it did not help.

## 2017-09-27 DIAGNOSIS — Z369 Encounter for antenatal screening, unspecified: Secondary | ICD-10-CM | POA: Diagnosis not present

## 2017-10-11 DIAGNOSIS — Z369 Encounter for antenatal screening, unspecified: Secondary | ICD-10-CM | POA: Diagnosis not present

## 2017-11-01 DIAGNOSIS — O4422 Partial placenta previa NOS or without hemorrhage, second trimester: Secondary | ICD-10-CM | POA: Diagnosis not present

## 2017-11-18 DIAGNOSIS — Z23 Encounter for immunization: Secondary | ICD-10-CM | POA: Diagnosis not present

## 2017-11-18 DIAGNOSIS — Z3480 Encounter for supervision of other normal pregnancy, unspecified trimester: Secondary | ICD-10-CM | POA: Diagnosis not present

## 2017-11-18 DIAGNOSIS — E039 Hypothyroidism, unspecified: Secondary | ICD-10-CM | POA: Diagnosis not present

## 2017-11-19 ENCOUNTER — Other Ambulatory Visit: Payer: Self-pay

## 2017-11-19 ENCOUNTER — Inpatient Hospital Stay (HOSPITAL_COMMUNITY)
Admission: AD | Admit: 2017-11-19 | Discharge: 2017-11-19 | Disposition: A | Discharge status: Home / Self Care | Payer: BLUE CROSS/BLUE SHIELD | Source: Ambulatory Visit | Attending: Obstetrics | Admitting: Obstetrics

## 2017-11-19 ENCOUNTER — Encounter (HOSPITAL_COMMUNITY): Payer: Self-pay | Admitting: *Deleted

## 2017-11-19 DIAGNOSIS — Z3A29 29 weeks gestation of pregnancy: Secondary | ICD-10-CM | POA: Insufficient documentation

## 2017-11-19 DIAGNOSIS — O4703 False labor before 37 completed weeks of gestation, third trimester: Secondary | ICD-10-CM | POA: Diagnosis not present

## 2017-11-19 DIAGNOSIS — O479 False labor, unspecified: Secondary | ICD-10-CM | POA: Diagnosis not present

## 2017-11-19 LAB — URINALYSIS, ROUTINE W REFLEX MICROSCOPIC
BILIRUBIN URINE: NEGATIVE
Glucose, UA: NEGATIVE mg/dL
Hgb urine dipstick: NEGATIVE
Ketones, ur: NEGATIVE mg/dL
Leukocytes, UA: NEGATIVE
NITRITE: NEGATIVE
PROTEIN: NEGATIVE mg/dL
SPECIFIC GRAVITY, URINE: 1.001 — AB (ref 1.005–1.030)
pH: 7 (ref 5.0–8.0)

## 2017-11-19 NOTE — MAU Provider Note (Signed)
Chief Complaint:  Contractions   First Provider Initiated Contact with Patient 11/19/17 0134      HPI: Catherine Schroeder is a 32 y.o. [phone] at [redacted]w[redacted]d who presents to maternity admissions reporting cramping contractions 4-10 times per hour, especially at night. These are not painful.  She reports back pain during the day that is constant but at night she starts cramping with abdominal tightening and pressure. These are the same in intensity since onset. There are no other symptoms. She has not tried any other treatments.  Pt was in the office yesterday and cervix was 0/thick/high, 1 cm external os. She reports good fetal movement.   HPI  Past Medical History: Past Medical History:  Diagnosis Date  . Gestational diabetes    diet controlled  . Gestational diabetes mellitus (GDM), antepartum   . Headache   . Hernia, ventral    MULTIPLE  . Hypothyroidism    during pregnancy only    Past obstetric history: OB History  Gravida Para Term Preterm AB Living  4 3 3     3   SAB TAB Ectopic Multiple Live Births        0 3    # Outcome Date GA Lbr Len/2nd Weight Sex Delivery Anes PTL Lv  4 Current           3 Term 06/11/15 [redacted]w[redacted]d 01:47 / 00:06 3160 g M Vag-Spont EPI  LIV  2 Term 09/05/12 [redacted]w[redacted]d / 00:23 3655 g M Vag-Spont EPI  LIV  1 Term 11/18/10 [redacted]w[redacted]d 11:38 / 00:44  F Vag-Spont EPI  LIV    Past Surgical History: Past Surgical History:  Procedure Laterality Date  . CHOLECYSTECTOMY  01/09/2011   Procedure: LAPAROSCOPIC CHOLECYSTECTOMY WITH INTRAOPERATIVE CHOLANGIOGRAM;  Surgeon: Ernestene Mention, MD;  Location: [facility];  Service: General;  Laterality: N/A;  . ERCP  01/08/2011   Procedure: ENDOSCOPIC RETROGRADE CHOLANGIOPANCREATOGRAPHY (ERCP);  Surgeon: Rob Bunting, MD;  Location: [facility];  Service: Endoscopy;  Laterality: N/A;  . EUS  01/08/2011   Procedure: UPPER ENDOSCOPIC ULTRASOUND (EUS) RADIAL;  Surgeon: Rob Bunting, MD;  Location: [facility];  Service: Endoscopy;  Laterality:  N/A;  Wants to look with radial EUS scope prior to ERCP  . HERNIA REPAIR     above umbilicus  . INSERTION OF MESH N/A 11/12/2013   Procedure: INSERTION OF MESH;  Surgeon: Claud Kelp, MD;  Location: [facility];  Service: General;  Laterality: N/A;  . VENTRAL HERNIA REPAIR N/A 11/12/2013   Procedure: LAPAROSCOPIC MULTIPLE  VENTRAL HERNIAS POSSIBLE OPEN;  Surgeon: Claud Kelp, MD;  Location: [facility];  Service: General;  Laterality: N/A;    Family History: Family History  Problem Relation Age of Onset  . Arthritis Brother   . Diabetes Maternal Grandmother     Social History: Social History   Tobacco Use  . Smoking status: Never Smoker  . Smokeless tobacco: Never Used  Substance Use Topics  . Alcohol use: No  . Drug use: No    Allergies: No Known Allergies  Meds:  Medications Prior to Admission  Medication Sig Dispense Refill Last Dose  . butalbital-acetaminophen-caffeine (FIORICET, ESGIC) 50-325-40 MG tablet Take 1-2 tablets by mouth every 6 (six) hours as needed for headache. 20 tablet 0   . famotidine (PEPCID) 40 MG tablet Take 40 mg by mouth daily.   09/05/2017 at Unknown time  . levothyroxine (SYNTHROID, LEVOTHROID) 25 MCG tablet Take 25 mcg by mouth daily before breakfast. Pt thinks dose is 25  09/06/2017 at Unknown time  . ondansetron (ZOFRAN ODT) 8 MG disintegrating tablet Take 1 tablet (8 mg total) by mouth every 8 (eight) hours as needed for nausea or vomiting. 20 tablet 0   . prenatal vitamin w/FE, FA (PRENATAL 1 + 1) 27-1 MG TABS Take 1 tablet by mouth every morning.    09/06/2017 at Unknown time  . promethazine (PHENERGAN) 12.5 MG tablet Take 1 tablet (12.5 mg total) by mouth every 6 (six) hours as needed for nausea or vomiting. (Patient not taking: Reported on 06/11/2015) 30 tablet 0 Not Taking at Unknown time    ROS:  Review of Systems  Constitutional: Negative for chills, fatigue and fever.  Eyes: Negative for visual disturbance.  Respiratory: Negative for  shortness of breath.   Cardiovascular: Negative for chest pain.  Gastrointestinal: Positive for abdominal pain. Negative for nausea and vomiting.  Genitourinary: Positive for pelvic pain. Negative for difficulty urinating, dysuria, flank pain, vaginal bleeding, vaginal discharge and vaginal pain.  Musculoskeletal: Positive for back pain.  Neurological: Negative for dizziness and headaches.  Psychiatric/Behavioral: Negative.      I have reviewed patient's Past Medical Hx, Surgical Hx, Family Hx, Social Hx, medications and allergies.   Physical Exam   Patient Vitals for the past 24 hrs:  BP Temp Pulse Resp Height Weight  11/19/17 0017 126/66 - 90 - - -  11/19/17 0016 - 98 F (36.7 C) - 18 5' 4.5" (1.638 m) 100.7 kg   Constitutional: Well-developed, well-nourished female in no acute distress.  Cardiovascular: normal rate Respiratory: normal effort GI: Abd soft, non-tender, gravid appropriate for gestational age.  MS: Extremities nontender, no edema, normal ROM Neurologic: Alert and oriented x 4.  GU: Neg CVAT.   Dilation: Closed Exam by:: Raynelle Chary   FHT:  Baseline 135 , moderate variability, accelerations present, no decelerations Contractions: rare, mild to palpation   Labs: Results for orders placed or performed during the hospital encounter of 11/19/17 (from the past 24 hour(s))  Urinalysis, Routine w reflex microscopic     Status: Abnormal   Collection Time: 11/19/17 12:26 AM  Result Value Ref Range   Color, Urine STRAW (A) YELLOW   APPearance CLEAR CLEAR   Specific Gravity, Urine 1.001 (L) 1.005 - 1.030   pH 7.0 5.0 - 8.0   Glucose, UA NEGATIVE NEGATIVE mg/dL   Hgb urine dipstick NEGATIVE NEGATIVE   Bilirubin Urine NEGATIVE NEGATIVE   Ketones, ur NEGATIVE NEGATIVE mg/dL   Protein, ur NEGATIVE NEGATIVE mg/dL   Nitrite NEGATIVE NEGATIVE   Leukocytes, UA NEGATIVE NEGATIVE      Imaging:  No results found.  MAU Course/MDM: I have ordered labs and  reviewed results.  NST reviewed and reactive No evidence of labor with closed cervix and rare contractions on toco Rest/ice/heat/warm bath/Tylenol/wear pregnancy suport belt during the day Keep scheduled appts in the office Return to MAU as needed for signs of labor or emergencies Pt discharge with strict preterm labor precautions.  Today's evaluation included a work-up for preterm labor which can be life-threatening for both mom and baby.  Assessment: 1. Braxton Hick's contraction     Plan: Discharge home Labor precautions and fetal kick counts Follow-up Information    Carrington Clamp, MD Follow up.   Specialty:  Obstetrics and Gynecology Why:  As scheduled, return to MAU as needed for signs of labor or emergencies. Contact information: [address] [place] [address] [phone]          Allergies  as of 11/19/2017   No Known Allergies     Medication List    TAKE these medications   butalbital-acetaminophen-caffeine 50-325-40 MG tablet Commonly known as:  FIORICET, ESGIC Take 1-2 tablets by mouth every 6 (six) hours as needed for headache.   famotidine 40 MG tablet Commonly known as:  PEPCID Take 40 mg by mouth daily.   levothyroxine 25 MCG tablet Commonly known as:  SYNTHROID, LEVOTHROID Take 25 mcg by mouth daily before breakfast. Pt thinks dose is 25   ondansetron 8 MG disintegrating tablet Commonly known as:  ZOFRAN-ODT Take 1 tablet (8 mg total) by mouth every 8 (eight) hours as needed for nausea or vomiting.   prenatal vitamin w/FE, FA 27-1 MG Tabs tablet Take 1 tablet by mouth every morning.   promethazine 12.5 MG tablet Commonly known as:  PHENERGAN Take 1 tablet (12.5 mg total) by mouth every 6 (six) hours as needed for nausea or vomiting.       Naraya Stoneberg Certified Nurse-Midwife 11/19/2017 1:53 AM

## 2017-11-19 NOTE — MAU Note (Addendum)
I have been having braxton hicks ctxs today. Saw MD today and inside of cervix closed but cannot remember what outside was. Was told to come to hosp if cont to have more than 4ctxs an hour. Have been having more than 4 ctxs/hr since 1600. Denies LOF or bleeding. No pain

## 2017-11-23 DIAGNOSIS — Z369 Encounter for antenatal screening, unspecified: Secondary | ICD-10-CM | POA: Diagnosis not present

## 2017-12-06 DIAGNOSIS — Z369 Encounter for antenatal screening, unspecified: Secondary | ICD-10-CM | POA: Diagnosis not present

## 2017-12-21 ENCOUNTER — Encounter (HOSPITAL_COMMUNITY)
Admission: AD | Disposition: A | Discharge status: Home / Self Care | Payer: Self-pay | Source: Home / Self Care | Attending: Obstetrics and Gynecology

## 2017-12-21 ENCOUNTER — Inpatient Hospital Stay (HOSPITAL_COMMUNITY): Payer: BLUE CROSS/BLUE SHIELD | Admitting: Anesthesiology

## 2017-12-21 ENCOUNTER — Inpatient Hospital Stay (HOSPITAL_COMMUNITY)
Admission: AD | Admit: 2017-12-21 | Discharge: 2017-12-24 | DRG: 788 | Disposition: A | Discharge status: Home / Self Care | Payer: BLUE CROSS/BLUE SHIELD | Attending: Obstetrics and Gynecology | Admitting: Obstetrics and Gynecology

## 2017-12-21 ENCOUNTER — Encounter (HOSPITAL_COMMUNITY): Payer: Self-pay | Admitting: Obstetrics and Gynecology

## 2017-12-21 DIAGNOSIS — O99284 Endocrine, nutritional and metabolic diseases complicating childbirth: Secondary | ICD-10-CM | POA: Diagnosis present

## 2017-12-21 DIAGNOSIS — E039 Hypothyroidism, unspecified: Secondary | ICD-10-CM | POA: Diagnosis present

## 2017-12-21 DIAGNOSIS — Z3A34 34 weeks gestation of pregnancy: Secondary | ICD-10-CM | POA: Diagnosis not present

## 2017-12-21 DIAGNOSIS — R011 Cardiac murmur, unspecified: Secondary | ICD-10-CM | POA: Diagnosis not present

## 2017-12-21 DIAGNOSIS — Z3A00 Weeks of gestation of pregnancy not specified: Secondary | ICD-10-CM | POA: Diagnosis not present

## 2017-12-21 DIAGNOSIS — Z9889 Other specified postprocedural states: Secondary | ICD-10-CM

## 2017-12-21 DIAGNOSIS — O4593 Premature separation of placenta, unspecified, third trimester: Secondary | ICD-10-CM | POA: Diagnosis not present

## 2017-12-21 DIAGNOSIS — R010 Benign and innocent cardiac murmurs: Secondary | ICD-10-CM | POA: Diagnosis not present

## 2017-12-21 HISTORY — DX: Other specified postprocedural states: Z98.890

## 2017-12-21 LAB — CBC
HCT: 33.1 % — ABNORMAL LOW (ref 36.0–46.0)
HEMATOCRIT: 35.9 % — AB (ref 36.0–46.0)
Hemoglobin: 12.4 g/dL (ref 12.0–15.0)
Hemoglobin: 11.3 g/dL — ABNORMAL LOW (ref 12.0–15.0)
MCH: 31.5 pg (ref 26.0–34.0)
MCH: 31.1 pg (ref 26.0–34.0)
MCHC: 34.5 g/dL (ref 30.0–36.0)
MCHC: 34.1 g/dL (ref 30.0–36.0)
MCV: 91.1 fL (ref 80.0–100.0)
MCV: 91.2 fL (ref 80.0–100.0)
NRBC: 0 % (ref 0.0–0.2)
PLATELETS: 246 10*3/uL (ref 150–400)
Platelets: 241 10*3/uL (ref 150–400)
RBC: 3.94 MIL/uL (ref 3.87–5.11)
RBC: 3.63 MIL/uL — ABNORMAL LOW (ref 3.87–5.11)
RDW: 14.8 % (ref 11.5–15.5)
RDW: 14.7 % (ref 11.5–15.5)
WBC: 11.9 10*3/uL — ABNORMAL HIGH (ref 4.0–10.5)
WBC: 16.9 10*3/uL — ABNORMAL HIGH (ref 4.0–10.5)
nRBC: 0 % (ref 0.0–0.2)

## 2017-12-21 LAB — TYPE AND SCREEN
ABO/RH(D): O POS
ANTIBODY SCREEN: NEGATIVE

## 2017-12-21 LAB — RPR: RPR Ser Ql: NONREACTIVE

## 2017-12-21 SURGERY — Surgical Case
Anesthesia: Spinal | Site: Abdomen | Wound class: Clean Contaminated

## 2017-12-21 MED ORDER — WITCH HAZEL-GLYCERIN EX PADS: 1.0000 "application " | MEDICATED_PAD | CUTANEOUS | Status: DC | PRN

## 2017-12-21 MED ORDER — BISACODYL 10 MG RE SUPP: 10.0000 mg | Freq: Every day | RECTAL | Status: DC | PRN

## 2017-12-21 MED ORDER — SIMETHICONE 80 MG PO CHEW
80.0000 mg | CHEWABLE_TABLET | Freq: Three times a day (TID) | ORAL | Status: DC
  Administered 2017-12-21 – 2017-12-24 (×9): 80 mg via ORAL
  Filled 2017-12-21 (×9): qty 1

## 2017-12-21 MED ORDER — OXYTOCIN 10 UNIT/ML IJ SOLN
INTRAMUSCULAR | Status: AC
  Filled 2017-12-21: qty 4

## 2017-12-21 MED ORDER — ONDANSETRON HCL 4 MG/2ML IJ SOLN
INTRAMUSCULAR | Status: AC
  Filled 2017-12-21: qty 2

## 2017-12-21 MED ORDER — DEXAMETHASONE SODIUM PHOSPHATE 10 MG/ML IJ SOLN
INTRAMUSCULAR | Status: AC
  Filled 2017-12-21: qty 1

## 2017-12-21 MED ORDER — DIPHENHYDRAMINE HCL 50 MG/ML IJ SOLN
INTRAMUSCULAR | Status: AC
  Filled 2017-12-21: qty 1

## 2017-12-21 MED ORDER — ONDANSETRON HCL 4 MG/2ML IJ SOLN
INTRAMUSCULAR | Status: DC | PRN
  Administered 2017-12-21: 4 mg via INTRAVENOUS

## 2017-12-21 MED ORDER — KETOROLAC TROMETHAMINE 30 MG/ML IJ SOLN
INTRAMUSCULAR | Status: AC
  Filled 2017-12-21: qty 1

## 2017-12-21 MED ORDER — DEXAMETHASONE SODIUM PHOSPHATE 10 MG/ML IJ SOLN
INTRAMUSCULAR | Status: DC | PRN
  Administered 2017-12-21: 10 mg via INTRAVENOUS

## 2017-12-21 MED ORDER — NALBUPHINE HCL 10 MG/ML IJ SOLN
5.0000 mg | Freq: Once | INTRAMUSCULAR | Status: AC | PRN
  Administered 2017-12-21: 5 mg via INTRAVENOUS

## 2017-12-21 MED ORDER — LEVOTHYROXINE SODIUM 25 MCG PO TABS
25.0000 ug | ORAL_TABLET | Freq: Every day | ORAL | Status: DC
Start: 2017-12-21 — End: 2017-12-24
  Administered 2017-12-21 – 2017-12-24 (×4): 25 ug via ORAL
  Filled 2017-12-21 (×5): qty 1

## 2017-12-21 MED ORDER — MORPHINE SULFATE (PF) 0.5 MG/ML IJ SOLN
INTRAMUSCULAR | Status: AC
  Filled 2017-12-21: qty 10

## 2017-12-21 MED ORDER — HYDROMORPHONE HCL 1 MG/ML IJ SOLN
0.2500 mg | INTRAMUSCULAR | Status: DC | PRN
  Administered 2017-12-21: 0.5 mg via INTRAVENOUS
  Administered 2017-12-21 (×2): 0.25 mg via INTRAVENOUS

## 2017-12-21 MED ORDER — HYDROMORPHONE HCL 1 MG/ML IJ SOLN
INTRAMUSCULAR | Status: AC
  Filled 2017-12-21: qty 1

## 2017-12-21 MED ORDER — MEPERIDINE HCL 25 MG/ML IJ SOLN: 6.2500 mg | INTRAMUSCULAR | Status: DC | PRN

## 2017-12-21 MED ORDER — NALBUPHINE HCL 10 MG/ML IJ SOLN: 5.0000 mg | Freq: Once | INTRAMUSCULAR | Status: AC | PRN

## 2017-12-21 MED ORDER — SIMETHICONE 80 MG PO CHEW
80.0000 mg | CHEWABLE_TABLET | ORAL | Status: DC
  Administered 2017-12-22 – 2017-12-23 (×3): 80 mg via ORAL
  Filled 2017-12-21 (×3): qty 1

## 2017-12-21 MED ORDER — SOD CITRATE-CITRIC ACID 500-334 MG/5ML PO SOLN: 30.0000 mL | Freq: Once | ORAL | Status: DC

## 2017-12-21 MED ORDER — LACTATED RINGERS IV SOLN
INTRAVENOUS | Status: DC
  Administered 2017-12-21: 20:00:00 via INTRAVENOUS

## 2017-12-21 MED ORDER — METHYLERGONOVINE MALEATE 0.2 MG PO TABS: 0.2000 mg | ORAL_TABLET | ORAL | Status: DC | PRN

## 2017-12-21 MED ORDER — MORPHINE SULFATE (PF) 0.5 MG/ML IJ SOLN
INTRAMUSCULAR | Status: DC | PRN
  Administered 2017-12-21: .15 mg via INTRATHECAL

## 2017-12-21 MED ORDER — OXYTOCIN 40 UNITS IN LACTATED RINGERS INFUSION - SIMPLE MED
2.5000 [IU]/h | INTRAVENOUS | Status: AC
  Administered 2017-12-21: 2.5 [IU]/h via INTRAVENOUS
  Filled 2017-12-21: qty 1000

## 2017-12-21 MED ORDER — FENTANYL CITRATE (PF) 250 MCG/5ML IJ SOLN
INTRAMUSCULAR | Status: AC
  Filled 2017-12-21: qty 5

## 2017-12-21 MED ORDER — NALOXONE HCL 0.4 MG/ML IJ SOLN: 0.4000 mg | INTRAMUSCULAR | Status: DC | PRN

## 2017-12-21 MED ORDER — NALBUPHINE HCL 10 MG/ML IJ SOLN: 5.0000 mg | INTRAMUSCULAR | Status: DC | PRN

## 2017-12-21 MED ORDER — DIPHENHYDRAMINE HCL 25 MG PO CAPS
25.0000 mg | ORAL_CAPSULE | Freq: Four times a day (QID) | ORAL | Status: DC | PRN
  Administered 2017-12-22 (×2): 25 mg via ORAL
  Filled 2017-12-21 (×2): qty 1

## 2017-12-21 MED ORDER — DIPHENHYDRAMINE HCL 50 MG/ML IJ SOLN
12.5000 mg | INTRAMUSCULAR | Status: DC | PRN
  Administered 2017-12-21: 12.5 mg via INTRAVENOUS

## 2017-12-21 MED ORDER — CEFAZOLIN SODIUM-DEXTROSE 2-4 GM/100ML-% IV SOLN
INTRAVENOUS | Status: AC
  Filled 2017-12-21: qty 100

## 2017-12-21 MED ORDER — MENTHOL 3 MG MT LOZG: 1.0000 | LOZENGE | OROMUCOSAL | Status: DC | PRN

## 2017-12-21 MED ORDER — PHENYLEPHRINE HCL 10 MG/ML IJ SOLN
INTRAMUSCULAR | Status: DC | PRN
  Administered 2017-12-21 (×2): 80 ug via INTRAVENOUS

## 2017-12-21 MED ORDER — TETANUS-DIPHTH-ACELL PERTUSSIS 5-2.5-18.5 LF-MCG/0.5 IM SUSP: 0.5000 mL | Freq: Once | INTRAMUSCULAR | Status: DC

## 2017-12-21 MED ORDER — NALBUPHINE HCL 10 MG/ML IJ SOLN
INTRAMUSCULAR | Status: AC
  Filled 2017-12-21: qty 1

## 2017-12-21 MED ORDER — CEFAZOLIN SODIUM-DEXTROSE 2-3 GM-%(50ML) IV SOLR
INTRAVENOUS | Status: DC | PRN
  Administered 2017-12-21: 2 g via INTRAVENOUS

## 2017-12-21 MED ORDER — ONDANSETRON HCL 4 MG/2ML IJ SOLN: 4.0000 mg | Freq: Three times a day (TID) | INTRAMUSCULAR | Status: DC | PRN

## 2017-12-21 MED ORDER — SOD CITRATE-CITRIC ACID 500-334 MG/5ML PO SOLN
ORAL | Status: AC
  Administered 2017-12-21: 04:00:00
  Filled 2017-12-21: qty 15

## 2017-12-21 MED ORDER — PHENYLEPHRINE 40 MCG/ML (10ML) SYRINGE FOR IV PUSH (FOR BLOOD PRESSURE SUPPORT)
PREFILLED_SYRINGE | INTRAVENOUS | Status: AC
  Filled 2017-12-21: qty 10

## 2017-12-21 MED ORDER — KETOROLAC TROMETHAMINE 30 MG/ML IJ SOLN: 30.0000 mg | Freq: Four times a day (QID) | INTRAMUSCULAR | Status: AC | PRN

## 2017-12-21 MED ORDER — NALOXONE HCL 4 MG/10ML IJ SOLN
1.0000 ug/kg/h | INTRAVENOUS | Status: DC | PRN
  Filled 2017-12-21: qty 5

## 2017-12-21 MED ORDER — ZOLPIDEM TARTRATE 5 MG PO TABS: 5.0000 mg | ORAL_TABLET | Freq: Every evening | ORAL | Status: DC | PRN

## 2017-12-21 MED ORDER — IBUPROFEN 800 MG PO TABS
800.0000 mg | ORAL_TABLET | Freq: Three times a day (TID) | ORAL | Status: DC
  Administered 2017-12-21 – 2017-12-24 (×10): 800 mg via ORAL
  Filled 2017-12-21 (×10): qty 1

## 2017-12-21 MED ORDER — SIMETHICONE 80 MG PO CHEW
80.0000 mg | CHEWABLE_TABLET | ORAL | Status: DC | PRN
  Filled 2017-12-21: qty 1

## 2017-12-21 MED ORDER — OXYCODONE-ACETAMINOPHEN 5-325 MG PO TABS
1.0000 | ORAL_TABLET | ORAL | Status: DC | PRN
  Administered 2017-12-22 – 2017-12-24 (×5): 1 via ORAL
  Filled 2017-12-21 (×5): qty 1

## 2017-12-21 MED ORDER — PRENATAL MULTIVITAMIN CH
1.0000 | ORAL_TABLET | Freq: Every day | ORAL | Status: DC
  Administered 2017-12-22 – 2017-12-23 (×2): 1 via ORAL
  Filled 2017-12-21 (×2): qty 1

## 2017-12-21 MED ORDER — LACTATED RINGERS IV SOLN
INTRAVENOUS | Status: DC | PRN
  Administered 2017-12-21: 05:00:00 via INTRAVENOUS

## 2017-12-21 MED ORDER — ACETAMINOPHEN 325 MG PO TABS
650.0000 mg | ORAL_TABLET | ORAL | Status: DC | PRN
  Administered 2017-12-21 (×2): 650 mg via ORAL
  Filled 2017-12-21 (×3): qty 2

## 2017-12-21 MED ORDER — FERROUS SULFATE 325 (65 FE) MG PO TABS
325.0000 mg | ORAL_TABLET | Freq: Two times a day (BID) | ORAL | Status: DC
  Administered 2017-12-21 – 2017-12-24 (×7): 325 mg via ORAL
  Filled 2017-12-21 (×7): qty 1

## 2017-12-21 MED ORDER — SCOPOLAMINE 1 MG/3DAYS TD PT72
1.0000 | MEDICATED_PATCH | Freq: Once | TRANSDERMAL | Status: AC
  Administered 2017-12-21: 1.5 mg via TRANSDERMAL
  Filled 2017-12-21: qty 1

## 2017-12-21 MED ORDER — MIDAZOLAM HCL 2 MG/2ML IJ SOLN
INTRAMUSCULAR | Status: AC
  Filled 2017-12-21: qty 2

## 2017-12-21 MED ORDER — FENTANYL CITRATE (PF) 100 MCG/2ML IJ SOLN
INTRAMUSCULAR | Status: DC | PRN
  Administered 2017-12-21: 15 ug via INTRATHECAL

## 2017-12-21 MED ORDER — METHYLERGONOVINE MALEATE 0.2 MG/ML IJ SOLN: 0.2000 mg | INTRAMUSCULAR | Status: DC | PRN

## 2017-12-21 MED ORDER — DIBUCAINE 1 % RE OINT: 1.0000 | TOPICAL_OINTMENT | RECTAL | Status: DC | PRN

## 2017-12-21 MED ORDER — PROMETHAZINE HCL 25 MG/ML IJ SOLN: 6.2500 mg | INTRAMUSCULAR | Status: DC | PRN

## 2017-12-21 MED ORDER — FLEET ENEMA 7-19 GM/118ML RE ENEM: 1.0000 | ENEMA | Freq: Every day | RECTAL | Status: DC | PRN

## 2017-12-21 MED ORDER — OXYCODONE-ACETAMINOPHEN 5-325 MG PO TABS
2.0000 | ORAL_TABLET | ORAL | Status: DC | PRN
  Administered 2017-12-22 – 2017-12-24 (×4): 2 via ORAL
  Filled 2017-12-21 (×4): qty 2

## 2017-12-21 MED ORDER — ACETAMINOPHEN 10 MG/ML IV SOLN
INTRAVENOUS | Status: AC
  Filled 2017-12-21: qty 100

## 2017-12-21 MED ORDER — MEASLES, MUMPS & RUBELLA VAC IJ SOLR
0.5000 mL | Freq: Once | INTRAMUSCULAR | Status: DC
  Filled 2017-12-21: qty 0.5

## 2017-12-21 MED ORDER — HYDROCODONE-ACETAMINOPHEN 7.5-325 MG PO TABS: 1.0000 | ORAL_TABLET | Freq: Once | ORAL | Status: DC | PRN

## 2017-12-21 MED ORDER — LACTATED RINGERS IV SOLN
INTRAVENOUS | Status: DC | PRN
  Administered 2017-12-21 (×2): via INTRAVENOUS

## 2017-12-21 MED ORDER — DIPHENHYDRAMINE HCL 25 MG PO CAPS
25.0000 mg | ORAL_CAPSULE | ORAL | Status: DC | PRN
  Administered 2017-12-21: 25 mg via ORAL
  Filled 2017-12-21 (×2): qty 1

## 2017-12-21 MED ORDER — BUPIVACAINE IN DEXTROSE 0.75-8.25 % IT SOLN
INTRATHECAL | Status: DC | PRN
  Administered 2017-12-21: 1.6 mL via INTRATHECAL

## 2017-12-21 MED ORDER — SODIUM CHLORIDE 0.9% FLUSH: 3.0000 mL | INTRAVENOUS | Status: DC | PRN

## 2017-12-21 MED ORDER — ACETAMINOPHEN 10 MG/ML IV SOLN: 1000.0000 mg | Freq: Once | INTRAVENOUS | Status: DC | PRN

## 2017-12-21 MED ORDER — PHENYLEPHRINE 8 MG IN D5W 100 ML (0.08MG/ML) PREMIX OPTIME
INJECTION | INTRAVENOUS | Status: DC | PRN
  Administered 2017-12-21 (×2): 60 ug/min via INTRAVENOUS

## 2017-12-21 MED ORDER — FAMOTIDINE 20 MG PO TABS
40.0000 mg | ORAL_TABLET | Freq: Every day | ORAL | Status: DC
  Administered 2017-12-21 – 2017-12-23 (×3): 40 mg via ORAL
  Filled 2017-12-21 (×4): qty 2

## 2017-12-21 MED ORDER — PHENYLEPHRINE 8 MG IN D5W 100 ML (0.08MG/ML) PREMIX OPTIME
INJECTION | INTRAVENOUS | Status: AC
  Filled 2017-12-21: qty 100

## 2017-12-21 MED ORDER — OXYTOCIN 10 UNIT/ML IJ SOLN
INTRAVENOUS | Status: DC | PRN
  Administered 2017-12-21: 40 [IU] via INTRAVENOUS

## 2017-12-21 MED ORDER — COCONUT OIL OIL: 1.0000 "application " | TOPICAL_OIL | Status: DC | PRN

## 2017-12-21 MED ORDER — KETOROLAC TROMETHAMINE 30 MG/ML IJ SOLN
30.0000 mg | Freq: Four times a day (QID) | INTRAMUSCULAR | Status: AC | PRN
  Administered 2017-12-21: 30 mg via INTRAMUSCULAR

## 2017-12-21 MED ORDER — SENNOSIDES-DOCUSATE SODIUM 8.6-50 MG PO TABS
2.0000 | ORAL_TABLET | ORAL | Status: DC
  Administered 2017-12-22 – 2017-12-23 (×3): 2 via ORAL
  Filled 2017-12-21 (×3): qty 2

## 2017-12-21 SURGICAL SUPPLY — 31 items
BENZOIN TINCTURE PRP APPL 2/3 (GAUZE/BANDAGES/DRESSINGS) ×2 IMPLANT
CHLORAPREP W/TINT 26ML (MISCELLANEOUS) ×2 IMPLANT
CLAMP CORD UMBIL (MISCELLANEOUS) IMPLANT
CLOTH BEACON ORANGE TIMEOUT ST (SAFETY) ×2 IMPLANT
DRSG OPSITE POSTOP 4X10 (GAUZE/BANDAGES/DRESSINGS) ×2 IMPLANT
ELECT REM PT RETURN 9FT ADLT (ELECTROSURGICAL) ×2
ELECTRODE REM PT RTRN 9FT ADLT (ELECTROSURGICAL) ×1 IMPLANT
EXTRACTOR VACUUM BELL STYLE (SUCTIONS) IMPLANT
GLOVE BIO SURGEON STRL SZ7 (GLOVE) ×2 IMPLANT
GLOVE BIOGEL PI IND STRL 7.0 (GLOVE) ×1 IMPLANT
GLOVE BIOGEL PI INDICATOR 7.0 (GLOVE) ×1
GOWN STRL REUS W/TWL LRG LVL3 (GOWN DISPOSABLE) ×4 IMPLANT
KIT ABG SYR 3ML LUER SLIP (SYRINGE) IMPLANT
NEEDLE HYPO 25X5/8 SAFETYGLIDE (NEEDLE) IMPLANT
NS IRRIG 1000ML POUR BTL (IV SOLUTION) ×2 IMPLANT
PACK C SECTION WH (CUSTOM PROCEDURE TRAY) ×2 IMPLANT
PAD OB MATERNITY 4.3X12.25 (PERSONAL CARE ITEMS) ×2 IMPLANT
PENCIL SMOKE EVAC W/HOLSTER (ELECTROSURGICAL) ×2 IMPLANT
RTRCTR C-SECT PINK 25CM LRG (MISCELLANEOUS) ×2 IMPLANT
SPONGE LAP 18X18 RF (DISPOSABLE) ×6 IMPLANT
STRIP CLOSURE SKIN 1/2X4 (GAUZE/BANDAGES/DRESSINGS) ×2 IMPLANT
SUT MNCRL 0 VIOLET CTX 36 (SUTURE) ×2 IMPLANT
SUT MONOCRYL 0 CTX 36 (SUTURE) ×2
SUT PLAIN 2 0 XLH (SUTURE) IMPLANT
SUT VIC AB 0 CT1 27 (SUTURE) ×2
SUT VIC AB 0 CT1 27XBRD ANBCTR (SUTURE) ×2 IMPLANT
SUT VIC AB 2-0 CT1 27 (SUTURE) ×1
SUT VIC AB 2-0 CT1 TAPERPNT 27 (SUTURE) ×1 IMPLANT
SUT VIC AB 4-0 KS 27 (SUTURE) ×2 IMPLANT
TOWEL OR 17X24 6PK STRL BLUE (TOWEL DISPOSABLE) ×2 IMPLANT
TRAY FOLEY W/BAG SLVR 14FR LF (SET/KITS/TRAYS/PACK) ×2 IMPLANT

## 2017-12-21 NOTE — Progress Notes (Signed)
RN assisted mom with pumping, hand expression with no colostrum noted.  Rael Yo L Dayja Loveridge, RN

## 2017-12-21 NOTE — Anesthesia Procedure Notes (Addendum)
Spinal  Patient location during procedure: OB Start time: 12/21/2017 4:22 AM End time: 12/21/2017 4:26 AM Staffing Anesthesiologist: Trevor IhaHouser, Amena Dockham A, MD Performed: anesthesiologist  Preanesthetic Checklist Completed: patient identified, surgical consent, pre-op evaluation, timeout performed, IV checked, risks and benefits discussed and monitors and equipment checked Spinal Block Patient position: sitting Prep: site prepped and draped and DuraPrep Patient monitoring: heart rate, cardiac monitor, continuous pulse ox and blood pressure Approach: midline Location: L3-4 Injection technique: single-shot Needle Needle type: Pencan  Needle gauge: 24 G Needle length: 10 cm Assessment Sensory level: T4 Additional Notes 1 Attempt (s). Pt tolerated procedure well.

## 2017-12-21 NOTE — H&P (Signed)
32 y.o. [redacted]w[redacted]d  [name] comes in c/o gushes of blood and clear fluid before came to hospital.  Pt was still actively bleeding on admission.  Spec exam by CNM revealed moderate bleeding from cervix..  Otherwise has good fetal movement felt last night.    Cervix was 2 cm by nurse and pt was contracting.  Baby was stable on EFM.  Past Medical History:  Diagnosis Date  . Gestational diabetes    diet controlled  . Gestational diabetes mellitus (GDM), antepartum   . Headache   . Hernia, ventral    MULTIPLE  . Hypothyroidism    during pregnancy only    Past Surgical History:  Procedure Laterality Date  . CHOLECYSTECTOMY  01/09/2011   Procedure: LAPAROSCOPIC CHOLECYSTECTOMY WITH INTRAOPERATIVE CHOLANGIOGRAM;  Surgeon: Ernestene Mention, MD;  Location: [facility];  Service: General;  Laterality: N/A;  . ERCP  01/08/2011   Procedure: ENDOSCOPIC RETROGRADE CHOLANGIOPANCREATOGRAPHY (ERCP);  Surgeon: Rob Bunting, MD;  Location: [facility];  Service: Endoscopy;  Laterality: N/A;  . EUS  01/08/2011   Procedure: UPPER ENDOSCOPIC ULTRASOUND (EUS) RADIAL;  Surgeon: Rob Bunting, MD;  Location: [facility];  Service: Endoscopy;  Laterality: N/A;  Wants to look with radial EUS scope prior to ERCP  . HERNIA REPAIR     above umbilicus  . INSERTION OF MESH N/A 11/12/2013   Procedure: INSERTION OF MESH;  Surgeon: Claud Kelp, MD;  Location: [facility];  Service: General;  Laterality: N/A;  . VENTRAL HERNIA REPAIR N/A 11/12/2013   Procedure: LAPAROSCOPIC MULTIPLE  VENTRAL HERNIAS POSSIBLE OPEN;  Surgeon: Claud Kelp, MD;  Location: [facility];  Service: General;  Laterality: N/A;    OB History  Gravida Para Term Preterm AB Living  4 3 3     3   SAB TAB Ectopic Multiple Live Births        0 3    # Outcome Date GA Lbr Len/2nd Weight Sex Delivery Anes PTL Lv  4 Current           3 Term 06/11/15 106w3d 01:47 / 00:06 3160 g M Vag-Spont EPI  LIV  2 Term 09/05/12 [redacted]w[redacted]d / 00:23 3655 g M Vag-Spont EPI  LIV  1 Term  11/18/10 [redacted]w[redacted]d 11:38 / 00:44  F Vag-Spont EPI  LIV    Social History   Socioeconomic History  . Marital status: Married    Spouse name: Not on file  . Number of children: Not on file  . Years of education: Not on file  . Highest education level: Not on file  Occupational History  . Not on file  Social Needs  . Financial resource strain: Not on file  . Food insecurity:    Worry: Not on file    Inability: Not on file  . Transportation needs:    Medical: Not on file    Non-medical: Not on file  Tobacco Use  . Smoking status: Never Smoker  . Smokeless tobacco: Never Used  Substance and Sexual Activity  . Alcohol use: No  . Drug use: No  . Sexual activity: Yes    Comment: pregnant  Lifestyle  . Physical activity:    Days per week: Not on file    Minutes per session: Not on file  . Stress: Not on file  Relationships  . Social connections:    Talks on phone: Not on file    Gets together: Not on file    Attends religious service: Not on file    Active member  of club or organization: Not on file    Attends meetings of clubs or organizations: Not on file    Relationship status: Not on file  . Intimate partner violence:    Fear of current or ex partner: Not on file    Emotionally abused: Not on file    Physically abused: Not on file    Forced sexual activity: Not on file  Other Topics Concern  . Not on file  Social History Narrative  . Not on file   Patient has no known allergies.    Prenatal Transfer Tool  Maternal Diabetes: No Genetic Screening: Abnormal:  Results: Elevated risk of Trisomy 21 1:180 Maternal Ultrasounds/Referrals: Normal Fetal Ultrasounds or other Referrals:  Other: normal but EIF seen Maternal Substance Abuse:  No Significant Maternal Medications:  Meds include: Syntroid Significant Maternal Lab Results: None  Other PNC: uncomplicated.  Pt declined MFM consult and further testing for abnormal first tri screen.    There were no vitals filed for  this visit.  Lungs/Cor:  NAD Abdomen:  soft, gravid Ex:  no cords, erythema SVE:  2 cm per CNM FHTs: good STV, NST R; Cat 1 tracing- but was tachy in R Toco:  q irreg   A/P   34 weeks with suspected abruption with large amount of 3rd tri bleeding.  Pt was counseled about the R/B/Alt of emergent c/s and agreed to proceed.   Catherine Schroeder A

## 2017-12-21 NOTE — Transfer of Care (Signed)
Immediate Anesthesia Transfer of Care Note  Patient: Catherine BeringLisa M Schroeder  Procedure(s) Performed: CESAREAN SECTION (N/A Abdomen)  Patient Location: PACU  Anesthesia Type:Spinal  Level of Consciousness: awake, alert, oriented  Airway & Oxygen Therapy: Patient Spontanous Breathing  Post-op Assessment: Post -op Vital signs reviewed and stable  Post vital signs: Reviewed and stable  Last Vitals:  Vitals Value Taken Time  BP 100/55 12/21/2017  5:30 AM  Temp    Pulse 83 12/21/2017  5:32 AM  Resp 16 12/21/2017  5:32 AM  SpO2 99 % 12/21/2017  5:32 AM  Vitals shown include unvalidated device data.  Last Pain:  Vitals:   12/21/17 0527  TempSrc: (P) Oral         Complications: No apparent anesthesia complications

## 2017-12-21 NOTE — Addendum Note (Signed)
Addendum  created 12/21/17 0843 by Graciela HusbandsFussell, Ariel Wingrove O, CRNA   Sign clinical note

## 2017-12-21 NOTE — Anesthesia Preprocedure Evaluation (Addendum)
Anesthesia Evaluation  Patient identified by MRN, date of birth, ID band Patient awake    Reviewed: Allergy & Precautions, NPO status , Patient's Chart, lab work & pertinent test results  Airway Mallampati: II  TM Distance: >3 FB Neck ROM: Full    Dental no notable dental hx. (+) Teeth Intact   Pulmonary neg pulmonary ROS,    Pulmonary exam normal breath sounds clear to auscultation       Cardiovascular negative cardio ROS Normal cardiovascular exam Rhythm:Regular Rate:Normal     Neuro/Psych  Headaches,    GI/Hepatic GERD  ,  Endo/Other  diabetesHypothyroidism   Renal/GU      Musculoskeletal   Abdominal   Peds  Hematology   Anesthesia Other Findings   Reproductive/Obstetrics (+) Pregnancy                             Lab Results  Component Value Date   WBC 11.9 (H) 12/21/2017   HGB 12.4 12/21/2017   HCT 35.9 (L) 12/21/2017   MCV 91.1 12/21/2017   PLT 246 12/21/2017    Anesthesia Physical Anesthesia Plan  ASA: III and emergent  Anesthesia Plan: Spinal   Post-op Pain Management:    Induction:   PONV Risk Score and Plan: Treatment may vary due to age or medical condition  Airway Management Planned: Natural Airway  Additional Equipment:   Intra-op Plan:   Post-operative Plan:   Informed Consent: I have reviewed the patients History and Physical, chart, labs and discussed the procedure including the risks, benefits and alternatives for the proposed anesthesia with the patient or authorized representative who has indicated his/her understanding and acceptance.     Plan Discussed with: CRNA, Surgeon and Anesthesiologist  Anesthesia Plan Comments: (Stat C/S for Abruption. FHT 160s have time for quick spinal per surgeon)       Anesthesia Quick Evaluation

## 2017-12-21 NOTE — Progress Notes (Signed)
Out of bed at 1100 did well.

## 2017-12-21 NOTE — Lactation Note (Signed)
This note was copied from a baby's chart. Lactation Consultation Note  Patient Name: Catherine Schroeder CharonLisa Tristan ZOXWR'UToday's Date: 12/21/2017 Reason for consult: Initial assessment;NICU baby;Infant < 6lbs;Late-preterm 34-36.6wks;Maternal endocrine disorder Type of Endocrine Disorder?: Diabetes  Visited with P4 Mom of 34 wk baby delivered by C-Section for placental abruption.  Baby 6 hrs old and in the NICU.  Set up DEBP and assisted Mom with her first pumping.  Mom is an experienced long term breastfeeder, having last stopped breastfeeding earlier this year.    Reviewed breast massage and hand expression.   Mom given colostrum containers, labels, and number dots for labeling colostrum before transporting EBM to NICU.   Mom aware of NICU pumping rooms available after she is discharged.  Mom to call her insurance company about obtaining a DEBP.  Mom aware of pump rental program through our gift shop.   Encourage Mom to do STS when she is able to go to NICU.  Encourage Mom to pump 8-12 times per 24 hrs, adding breast massage and hand expression to collect as much colostrum as possible.  NICU brochure and Lactation brochure left with Mom.   Mom aware of IP and OP lactation support available to her.     Interventions Interventions: Breast feeding basics reviewed;Hand express;Breast massage;DEBP  Lactation Tools Discussed/Used WIC Program: No Pump Review: Setup, frequency, and cleaning;Milk Storage Initiated by:: Erby Pian Leanor Voris RN IBCLC Date initiated:: 12/21/17   Consult Status Consult Status: Follow-up Date: 12/22/17 Follow-up type: In-patient    Judee ClaraSmith, Alexzandria Massman E 12/21/2017, 10:39 AM

## 2017-12-21 NOTE — Anesthesia Postprocedure Evaluation (Signed)
Anesthesia Post Note  Patient: Catherine BeringLisa M Schroeder  Procedure(s) Performed: CESAREAN SECTION (N/A Abdomen)     Patient location during evaluation: Mother Baby Anesthesia Type: Spinal Level of consciousness: awake and alert and oriented Pain management: satisfactory to patient Vital Signs Assessment: post-procedure vital signs reviewed and stable Respiratory status: respiratory function stable Cardiovascular status: stable Postop Assessment: no headache, no backache, epidural receding, patient able to bend at knees, no signs of nausea or vomiting and adequate PO intake Anesthetic complications: no    Last Vitals:  Vitals:   12/21/17 0715 12/21/17 0753  BP: 124/81 126/80  Pulse: 93 92  Resp: 19 18  Temp:  (!) 36.4 C  SpO2: 98% 97%    Last Pain:  Vitals:   12/21/17 0800  TempSrc:   PainSc: 5    Pain Goal:                 Iseah Plouff

## 2017-12-21 NOTE — MAU Note (Signed)
PT SAYS  AT 0330- SROM THEN AT 0345 VAG BLEEDING  , THEN AT 0330 DECREASE FETAL MOVEMENT  AND SOMETHING HANGING OUT.    IN TRIAGE - [facility]- 140-  TO RM  4 .

## 2017-12-21 NOTE — Anesthesia Postprocedure Evaluation (Signed)
Anesthesia Post Note  Patient: Catherine BeringLisa M Schroeder  Procedure(s) Performed: CESAREAN SECTION (N/A Abdomen)     Anesthesia Post Evaluation  Last Vitals:  Vitals:   12/21/17 0527  BP: (!) (P) 96/56  Pulse: (P) 84  Resp: (P) 18  Temp: (P) 36.8 C  SpO2: (P) 99%    Last Pain:  Vitals:   12/21/17 0527  TempSrc: (P) Oral   Pain Goal:                 EchoStarMERRITT,Jihad Brownlow

## 2017-12-21 NOTE — Anesthesia Postprocedure Evaluation (Signed)
Anesthesia Post Note  Patient: Roselyn BeringLisa M Callejo  Procedure(s) Performed: CESAREAN SECTION (N/A Abdomen)     Patient location during evaluation: Mother Baby Anesthesia Type: Spinal Level of consciousness: awake, awake and alert and oriented Pain management: pain level controlled Vital Signs Assessment: post-procedure vital signs reviewed and stable Respiratory status: spontaneous breathing Cardiovascular status: blood pressure returned to baseline Postop Assessment: no headache, patient able to bend at knees, adequate PO intake, no backache, spinal receding, no apparent nausea or vomiting and able to ambulate Anesthetic complications: no    Last Vitals:  Vitals:   12/21/17 1101 12/21/17 1102  BP: 117/76 114/78  Pulse: 85   Resp:    Temp:    SpO2:      Last Pain:  Vitals:   12/21/17 1120  TempSrc:   PainSc: 5    Pain Goal:                 Cleda ClarksBrowder, Eular Panek R

## 2017-12-21 NOTE — Brief Op Note (Signed)
12/21/2017  5:08 AM  PATIENT:  Catherine BeringLisa M Schroeder  32 y.o. female  PRE-OPERATIVE DIAGNOSIS: Heavy Vaginal bleeding third trimester.  POST-OPERATIVE DIAGNOSIS: same, suspect abruption  PROCEDURE:  Procedure(s): CESAREAN SECTION (N/A)  SURGEON:  Surgeon(s) and Role:    * Carrington ClampHorvath, Makenzie Vittorio, MD - Primary  ANESTHESIA:   spinal  EBL: about 350  SPECIMEN:  Source of Specimen:  placenta  DISPOSITION OF SPECIMEN:  PATHOLOGY  COUNTS:  YES  TOURNIQUET:  * No tourniquets in log *  DICTATION: .Note written in EPIC  PLAN OF CARE: Admit to inpatient   PATIENT DISPOSITION:  PACU - hemodynamically stable.   Delay start of Pharmacological VTE agent (>24hrs) due to surgical blood loss or risk of bleeding: not applicable

## 2017-12-21 NOTE — Progress Notes (Signed)
Cord ph was 7.28.

## 2017-12-21 NOTE — MAU Provider Note (Addendum)
History     CSN: [id number]  Arrival date and time: 12/21/17 [id number]   First Provider Initiated Contact with Patient 12/21/17 [phone]      Chief Complaint  Patient presents with  . Vaginal Bleeding   HPI Catherine Schroeder is a 32 y.o. [phone] at 6626w0d who presents with heavy vaginal bleeding. She states she had a big gush of fluid at 0330 that was followed by heavy vaginal bleeding. She states she was passing large clots. She also reports decreased movement since 2300. She reports increased braxton hicks contractions.   OB History    Gravida  4   Para  3   Term  3   Preterm      AB      Living  3     SAB      TAB      Ectopic      Multiple  0   Live Births  3           Past Medical History:  Diagnosis Date  . Gestational diabetes    diet controlled  . Gestational diabetes mellitus (GDM), antepartum   . Headache   . Hernia, ventral    MULTIPLE  . Hypothyroidism    during pregnancy only    Past Surgical History:  Procedure Laterality Date  . CHOLECYSTECTOMY  01/09/2011   Procedure: LAPAROSCOPIC CHOLECYSTECTOMY WITH INTRAOPERATIVE CHOLANGIOGRAM;  Surgeon: Ernestene MentionHaywood M Ingram, MD;  Location: [facility];  Service: General;  Laterality: N/A;  . ERCP  01/08/2011   Procedure: ENDOSCOPIC RETROGRADE CHOLANGIOPANCREATOGRAPHY (ERCP);  Surgeon: Rob Buntinganiel Jacobs, MD;  [facility];  Service: Endoscopy;  Laterality: N/A;  . EUS  01/08/2011   Procedure: UPPER ENDOSCOPIC ULTRASOUND (EUS) RADIAL;  Surgeon: Rob Buntinganiel Jacobs, MD;  Location: [facility];  Service: Endoscopy;  Laterality: N/A;  Wants to look with radial EUS scope prior to ERCP  . HERNIA REPAIR     above umbilicus  . INSERTION OF MESH N/A 11/12/2013   Procedure: INSERTION OF MESH;  Surgeon: Claud KelpHaywood Ingram, MD;  Location: [facility];  Service: General;  Laterality: N/A;  . VENTRAL HERNIA REPAIR N/A 11/12/2013   Procedure: LAPAROSCOPIC MULTIPLE  VENTRAL HERNIAS POSSIBLE OPEN;  Surgeon: Claud KelpHaywood Ingram, MD;  Location: [facility];  Service: General;  Laterality: N/A;    Family History  Problem Relation Age of Onset  . Arthritis Brother   . Diabetes Maternal Grandmother     Social History   Tobacco Use  . Smoking status: Never Smoker  . Smokeless tobacco: Never Used  Substance Use Topics  . Alcohol use: No  . Drug use: No    Allergies: No Known Allergies  Medications Prior to Admission  Medication Sig Dispense Refill Last Dose  . butalbital-acetaminophen-caffeine (FIORICET, ESGIC) 50-325-40 MG tablet Take 1-2 tablets by mouth every 6 (six) hours as needed for headache. 20 tablet 0   . famotidine (PEPCID) 40 MG tablet Take 40 mg by mouth daily.   09/05/2017 at Unknown time  . levothyroxine (SYNTHROID, LEVOTHROID) 25 MCG tablet Take 25 mcg by mouth daily before breakfast. Pt thinks dose is 25   09/06/2017 at Unknown time  . ondansetron (ZOFRAN ODT) 8 MG disintegrating tablet Take 1 tablet (8 mg total) by mouth every 8 (eight) hours as needed for nausea or vomiting. 20 tablet 0   . prenatal vitamin w/FE, FA (PRENATAL 1 + 1) 27-1 MG TABS Take 1 tablet by mouth every morning.    09/06/2017 at Unknown time  .  promethazine (PHENERGAN) 12.5 MG tablet Take 1 tablet (12.5 mg total) by mouth every 6 (six) hours as needed for nausea or vomiting. (Patient not taking: Reported on 06/11/2015) 30 tablet 0 Not Taking at Unknown time    Review of Systems  Gastrointestinal: Positive for abdominal pain.  Genitourinary: Positive for vaginal bleeding and vaginal discharge.   Physical Exam   currently breastfeeding.  Physical Exam  Nursing note and vitals reviewed. Constitutional: She is oriented to person, place, and time. She appears well-developed and well-nourished. No distress.  HENT:  Head: Normocephalic.  Eyes: Pupils are equal, round, and reactive to light.  Cardiovascular: Normal rate, regular rhythm and normal heart sounds.  Respiratory: Effort normal and breath sounds normal. No respiratory distress.  GI:  Soft. Bowel sounds are normal. She exhibits no distension. There is no tenderness.  Neurological: She is alert and oriented to person, place, and time.  Skin: Skin is warm and dry.  Psychiatric: She has a normal mood and affect. Her behavior is normal. Judgment and thought content normal.   Fetal Tracing:  Baseline: 130 Variability: moderate Accels: 15x15 Decels: none  Toco: occasional uc's  Dilation: 2 Effacement (%): 50   MAU Course  Procedures  MDM FHT confirmed Dr. Henderson Cloud notified of patient arrival with heavy vaginal bleeding by RN- will come to bedside for stat cesarean section  Assessment and Plan  Suspected Abruption  Code Cesarean called.  Dr. Henderson Cloud at bedside  Rolm Bookbinder CNM 12/21/2017, 4:15 AM

## 2017-12-21 NOTE — Addendum Note (Signed)
Addendum  created 12/21/17 1308 by Cleda ClarksBrowder, Bibi Economos R, CRNA   Sign clinical note

## 2017-12-21 NOTE — Op Note (Signed)
12/21/2017  5:08 AM  PATIENT:  Catherine BeringLisa M Schroeder  32 y.o. female  PRE-OPERATIVE DIAGNOSIS: Heavy Vaginal bleeding third trimester.  POST-OPERATIVE DIAGNOSIS: same, suspect abruption  PROCEDURE:  Procedure(s): CESAREAN SECTION (N/A)  SURGEON:  Surgeon(s) and Role:    * Carrington ClampHorvath, Kyrsten Deleeuw, MD - Primary  ANESTHESIA:   spinal  EBL: about 350  SPECIMEN:  Source of Specimen:  placenta  DISPOSITION OF SPECIMEN:  PATHOLOGY  COUNTS:  YES  TOURNIQUET:  * No tourniquets in log *  DICTATION: .Note written in EPIC  PLAN OF CARE: Admit to inpatient   PATIENT DISPOSITION:  PACU - hemodynamically stable.   Delay start of Pharmacological VTE agent (>24hrs) due to surgical blood loss or risk of bleeding: not applicable  Complications:  none Medications:  Ancef, Pitocin Findings:  Baby female, Apgars 9,9, weight P.   Normal tubes, ovaries and uterus seen.  Baby was transferred to NICU for weight pp.  Technique:  After adequate spinal anesthesia was achieved, the patient was prepped and draped in usual sterile fashion.  A foley catheter was used to drain the bladder.  A pfannanstiel incision was made with the scalpel and carried down to the fascia with the bovie cautery. The fascia was incised in the midline with the scalpel and carried in a transverse curvilinear manner bilaterally.  The fascia was reflected superiorly and inferiorly off the rectus muscles and the muscles split in the midline.  A bowel free portion of the peritoneum was entered bluntly and then extended in a superior and inferior manner with good visualization of the bowel and bladder.  The Alexis instrument was then placed and the vesico-uterine fascia tented up and incised in a transverse curvilinear manner.  A 2 cm transverse incision was made in the upper portion of the lower uterine segment until the amnion was exposed.   The incision was extended transversely in a blunt manner.  Scant fluid was noted and the baby delivered in  the vertex presentation without complication.  The baby was bulb suctioned and the cord was clamped and cut aftet stripping blood from cord into baby.  The baby was then handed to awaiting Neonatology.  The placenta was then delivered manually and the uterus cleared of all debris.  The uterine incision was then closed with a running lock stitch of 0 monocryl.  An imbricating layer of 0 monocryl was closed as well. Excellent hemostasis of the uterine incision was achieved and the abdomen was cleared with irrigation.  The peritoneum was closed with a running stitch of 2-0 vicryl.  This incorporated the rectus muscles as a separate layer.  The fascia was then closed with a running stitch of 0 vicryl.  The subcutaneous layer was closed with interrupted  stitches of 2-0 plain gut.  The skin was closed with 4-0 vicryl on a Keith needle and steri-strips.  The patient tolerated the procedure well and was returned to the recovery room in stable condition.  All counts were correct times three.  Catherine Schroeder A

## 2017-12-21 NOTE — Progress Notes (Signed)
Patient is doing well.  Just arrived at postpartum room, pain controlled  Vitals:   12/21/17 0630 12/21/17 0700 12/21/17 0715 12/21/17 0753  BP: 107/70 125/71 124/81 126/80  Pulse: 81 82 93 92  Resp: 15 17 19 18   Temp:  98 F (36.7 C)  (!) 97.5 F (36.4 C)  TempSrc:  Oral  Oral  SpO2: 100% 100% 98% 97%  Weight:      Height:        NAD Abdomen:  soft, appropriate tenderness, incisions intact and without erythema or drainage ext:    +SCDs    A/P    32 y.o. G4P4 POD 0 s/p urgent c/s for placental abruption at 34 weeks Unknown EBL at home prior to arrival in MAU.  Starting hgb 12.4--likely had not equilibrated from acute blood loss.  Will check hemoglobin 6 hours post op.

## 2017-12-22 ENCOUNTER — Encounter (HOSPITAL_COMMUNITY): Payer: Self-pay

## 2017-12-22 NOTE — Progress Notes (Signed)
Subjective: Postpartum Day 1: Cesarean Delivery Patient reports pain controlled, no nausea or vomiting,  Objective: Vital signs in last 24 hours: Temp:  [98.2 F (36.8 C)-99.1 F (37.3 C)] 98.6 F (37 C) (12/04 2028) Pulse Rate:  [68-93] 84 (12/05 0238) Resp:  [17-18] 17 (12/05 0238) BP: (93-122)/(56-78) 93/62 (12/05 0238) SpO2:  [98 %-100 %] 100 % (12/05 0238)  Physical Exam:  General: alert, cooperative and appears stated age Lochia: appropriate Uterine Fundus: firm Incision: healing well DVT Evaluation: No evidence of DVT seen on physical exam.  Recent Labs    12/21/17 0400 12/21/17 1149  HGB 12.4 11.3*  HCT 35.9* 33.1*    Assessment/Plan: Status post Cesarean section. Doing well postoperatively.  Continue current care.  Waynard ReedsKendra Joeann Steppe 12/22/2017, 9:38 AM

## 2017-12-22 NOTE — Lactation Note (Signed)
This note was copied from a baby's chart. Lactation Consultation Note  Patient Name: Catherine Schroeder ZOXWR'UToday's Date: 12/22/2017 Reason for consult: Follow-up assessment;NICU baby;Late-preterm 34-36.6wks;Infant < 6lbs;Maternal endocrine disorder Type of Endocrine Disorder?: Diabetes(GDM-Diet, Hypothyroidism)   Follow up with mom of 34 hour old infant. Infant in the NICU. Mom reports she has been up to the NICU and is able to BF infant. She reports infant is not feeding well. Discussed what to expect with PT infant with BF.   Mom has pumped a few times today, she has attempted hand expression with a few gtts noted. Enc mom to pump 8-12 x in 24 hours and to follow with hand expression. Worked with mom on hand expression and colostrum was noted. Mom is planning to go up and BF infant after LC leaves.   Mom reports she has a PIS at home from a previous baby, she is not sure it works. Enc her to try it out. She is to call insurance company to inquire about a new pump. Reviewed pumping with Symphony pump while in the hospital and in the NICU post d/c. Enc mom to take all tubing home with her to use with NICU pumps. Reviewed with parents that Symphony pumps are available in the hospital gift shop and that if she does not have another pump that works, I would recommend that she rent a DEBP.   Reviewed breast milk expression and storage for NICU infant. Reviewed transporting milk in cooler to NICU after discharge.   Mom reports all questions have been answered at this time. Mom to call out for assistance as needed.    Maternal Data Formula Feeding for Exclusion: No Has patient been taught Hand Expression?: Yes Does the patient have breastfeeding experience prior to this delivery?: Yes  Feeding Feeding Type: Formula  LATCH Score Latch: Too sleepy or reluctant, no latch achieved, no sucking elicited.  Audible Swallowing: None  Type of Nipple: Everted at rest and after stimulation  Comfort  (Breast/Nipple): Soft / non-tender  Hold (Positioning): No assistance needed to correctly position infant at breast.  LATCH Score: 6  Interventions Interventions: Hand express;DEBP  Lactation Tools Discussed/Used Tools: Pump Breast pump type: Double-Electric Breast Pump WIC Program: No Pump Review: Setup, frequency, and cleaning;Milk Storage Initiated by:: Reviewed and encouraged 8-12 x in 24 hours and follow wtih hand expression   Consult Status Consult Status: Follow-up Date: 12/23/17 Follow-up type: In-patient    Catherine FloodSharon S Tyreona Schroeder 12/22/2017, 3:15 PM

## 2017-12-23 NOTE — Progress Notes (Signed)
Pt will need a physicians prescription for a breast pump.

## 2017-12-23 NOTE — Progress Notes (Signed)
POD#2 Pt c/o some discomfort with cramping. Lochia wnl VSSAF Imp/ Stable Plan/ Routine care

## 2017-12-23 NOTE — Lactation Note (Signed)
This note was copied from a baby's chart. Lactation Consultation Note  Patient Name: Catherine Schroeder WUJWJ'XToday's Date: 12/23/2017   56 hour old baby Catherine LPTI in NICU.  Mom just finished pumping and massaging and hand expressing and was able to get about 5 ml.  Mom has an old pump at home, not sure if it works. Urged her to reach out to [employer] or [employer] to see if she could get new pump from [employer] company.  Discussed possibility of breastpump rental of hospital grade DEBP. Mom has cloudy tubing.  Showed mom how to run pump and clean tubing.  Urged her to let pump run 1 time day past pumping to keep tubes clean and anytime she sees any condensation. Mom reports no breast or nipple soreness.  Reviewed appropriate flange fit with mom. Spoke with mom's RN who is going to put it in her medical record to get a prescription for her breastpump if needed for insurance provider. Praised pumping.  Urged mom to pump 8-12 times day for 15 minutes. Mom with no questions/concerns regarding breastfeeding or pumping.  Urged mom to follow up with lactation services as needed.  Maternal Data    Feeding Feeding Type: Formula  LATCH Score                   Interventions    Lactation Tools Discussed/Used Tools: Pump   Consult Status      Catherine Schroeder 12/23/2017, 1:31 PM

## 2017-12-24 ENCOUNTER — Encounter (HOSPITAL_COMMUNITY): Payer: Self-pay | Admitting: *Deleted

## 2017-12-24 ENCOUNTER — Other Ambulatory Visit: Payer: Self-pay

## 2017-12-24 MED ORDER — OXYCODONE-ACETAMINOPHEN 5-325 MG PO TABS: 2.0000 | ORAL_TABLET | ORAL | 0 refills | Status: DC | PRN

## 2017-12-24 NOTE — Discharge Summary (Signed)
Obstetric Discharge Summary Reason for Admission: cesarean section and suspected placenta abruption Prenatal Procedures: ultrasound Intrapartum Procedures: cesarean: low cervical, transverse Postpartum Procedures: none Complications-Operative and Postpartum: none Hemoglobin  Date Value Ref Range Status  12/21/2017 11.3 (L) 12.0 - 15.0 g/dL Final   HCT  Date Value Ref Range Status  12/21/2017 33.1 (L) 36.0 - 46.0 % Final    Physical Exam:  General: alert and cooperative Lochia: appropriate   Discharge Diagnoses: 34 week IUP with suspected placental abruption  Discharge Information: Date: 12/24/2017 Activity: pelvic rest Diet: routine Medications: PNV, Ibuprofen and Percocet Condition: stable Instructions: refer to practice specific booklet Discharge to: home Follow-up Information    Carrington ClampHorvath, Michelle, MD. Schedule an appointment as soon as possible for a visit in 1 month(s).   Specialty:  Obstetrics and Gynecology Contact information: [address] [place] [id number] [phone]           Newborn Data: Live born female  Birth Weight: 4 lb 13.6 oz (2200 g) APGAR: 9, 9  Newborn Delivery   Birth date/time:  12/21/2017 04:36:00 Delivery type:  C-Section, Low Transverse Trial of labor:  No C-section categorization:  Primary     Home with in NICU.  ANDERSON,MARK E 12/24/2017, 8:12 AM

## 2017-12-24 NOTE — Progress Notes (Signed)
Patient in NICU on & off since discharge order and summary written. Also, waiting for order reconciliation to finalize discharge.

## 2017-12-24 NOTE — Progress Notes (Signed)
POD#3 Pt doing well. She will go home today VSSAF IMP/ stable Plan/Will discharge

## 2018-01-07 DIAGNOSIS — R010 Benign and innocent cardiac murmurs: Secondary | ICD-10-CM | POA: Diagnosis not present

## 2018-01-11 ENCOUNTER — Ambulatory Visit: Payer: Self-pay

## 2018-01-11 NOTE — Lactation Note (Signed)
This note was copied from a baby's chart. Lactation Consultation Note  Patient Name: Girl Latalia Etzler LSLHT'D Date: 01/11/2018 Reason for consult: Follow-up assessment;Nipple pain/trauma;NICU baby;Maternal endocrine disorder Type of Endocrine Disorder?: Diabetes  NICU RN called lactation to assess mom for nipple pain. Mom believes she's getting sore due to dry skin, requested coconut oil but since mom has been already discharged (and baby is the patient, not mom) lactation was able to get some coconut oil for her for breast care; even though mom can find it OTC, today is Christmas day and most stores are closed, pharmacy was notified for inventory purposes.  Mom had some ice on her breast when entering the NICU pod, asked her if she was engorged and she voiced: I'm having pain. Took mom to the pumping room and noticed that the skin on her right breast was dry but not scaly yet, left nipple looked intact upon examination. Mom said "both hurt just about the same", she didn't put baby to the breast to her last feeding because of nipple pain. Instructed mom how to use the coconut oil prior pumping and for breast massage.    When doing hand expression, noticed a strong MER out of the right breast, no milk obtained out of the left one, LC had to stop because mom was in pain, but no nipple trauma was noted; breast were soft, no engorgement noted either. Mom is pumping about 3-3.5 ounces of milk at a time every 3 hours.   Mom is using 2 different size flanges with her kit. She has the # 24 at home and she said "it's so bad that it almost makes me scream" and she has the # 27 here at the hospital when she pumps in the NICU pumping room. The # 27 flanges are not as painful as the # 24; asked mom to pump to watch the pumping in action and the # 27 seems to be an appropriate fit, LC also assisted with applying some coconut oil prior pumping. Mom still unsure if there's any improvement since she's still feeding  sore (the pain started 2-3 days) but told LC "it's not as bad as this morning". [employer] issued mom another set of # 27 flanges for home use.  Feeding plan:  1. Encouraged mom to keep pumping every 3 hours and at least once at night using only # 27 flanges 2. Mom will add coconut oil to the plan and will use it prior pumping 3. She'll continue taking baby STS for feeding at the breast when she visits the NICU  Mom reported all questions and concerns were answered, she's aware of [facility] services and will call PRN  Maternal Data    Feeding   Interventions Interventions: Breast feeding basics reviewed;Coconut oil  Lactation Tools Discussed/Used Tools: Coconut oil   Consult Status Consult Status: PRN Follow-up type: In-patient    Alden Bensinger Francene Boyers 01/11/2018, 4:12 PM

## 2018-01-13 DIAGNOSIS — R010 Benign and innocent cardiac murmurs: Secondary | ICD-10-CM | POA: Diagnosis not present

## 2018-01-16 DIAGNOSIS — Z00111 Health examination for newborn 8 to 28 days old: Secondary | ICD-10-CM | POA: Diagnosis not present

## 2018-04-26 ENCOUNTER — Encounter (HOSPITAL_COMMUNITY): Payer: Self-pay

## 2018-06-20 DIAGNOSIS — R42 Dizziness and giddiness: Secondary | ICD-10-CM | POA: Diagnosis not present

## 2018-06-20 DIAGNOSIS — R110 Nausea: Secondary | ICD-10-CM | POA: Diagnosis not present

## 2018-08-15 DIAGNOSIS — N926 Irregular menstruation, unspecified: Secondary | ICD-10-CM | POA: Diagnosis not present

## 2018-08-15 DIAGNOSIS — Z3202 Encounter for pregnancy test, result negative: Secondary | ICD-10-CM | POA: Diagnosis not present

## 2018-08-15 DIAGNOSIS — E039 Hypothyroidism, unspecified: Secondary | ICD-10-CM | POA: Diagnosis not present

## 2018-08-16 DIAGNOSIS — Z6839 Body mass index (BMI) 39.0-39.9, adult: Secondary | ICD-10-CM | POA: Diagnosis not present

## 2018-08-16 DIAGNOSIS — Z01419 Encounter for gynecological examination (general) (routine) without abnormal findings: Secondary | ICD-10-CM | POA: Diagnosis not present

## 2018-09-14 DIAGNOSIS — Z6839 Body mass index (BMI) 39.0-39.9, adult: Secondary | ICD-10-CM | POA: Diagnosis not present

## 2018-09-14 DIAGNOSIS — Z3202 Encounter for pregnancy test, result negative: Secondary | ICD-10-CM | POA: Diagnosis not present

## 2018-09-14 DIAGNOSIS — Z30430 Encounter for insertion of intrauterine contraceptive device: Secondary | ICD-10-CM | POA: Diagnosis not present

## 2018-09-26 DIAGNOSIS — E039 Hypothyroidism, unspecified: Secondary | ICD-10-CM | POA: Diagnosis not present

## 2018-10-20 DIAGNOSIS — E039 Hypothyroidism, unspecified: Secondary | ICD-10-CM | POA: Diagnosis not present

## 2018-12-12 DIAGNOSIS — N760 Acute vaginitis: Secondary | ICD-10-CM | POA: Diagnosis not present

## 2018-12-12 DIAGNOSIS — E039 Hypothyroidism, unspecified: Secondary | ICD-10-CM | POA: Diagnosis not present

## 2018-12-12 DIAGNOSIS — Z6841 Body Mass Index (BMI) 40.0 and over, adult: Secondary | ICD-10-CM | POA: Diagnosis not present

## 2019-01-04 DIAGNOSIS — Z30431 Encounter for routine checking of intrauterine contraceptive device: Secondary | ICD-10-CM | POA: Diagnosis not present

## 2019-01-04 DIAGNOSIS — Z6839 Body mass index (BMI) 39.0-39.9, adult: Secondary | ICD-10-CM | POA: Diagnosis not present

## 2019-01-19 DIAGNOSIS — Z8616 Personal history of COVID-19: Secondary | ICD-10-CM

## 2019-01-19 HISTORY — DX: Personal history of COVID-19: Z86.16

## 2019-03-05 DIAGNOSIS — Z13220 Encounter for screening for lipoid disorders: Secondary | ICD-10-CM | POA: Diagnosis not present

## 2019-03-05 DIAGNOSIS — E039 Hypothyroidism, unspecified: Secondary | ICD-10-CM | POA: Diagnosis not present

## 2019-03-05 DIAGNOSIS — Z0000 Encounter for general adult medical examination without abnormal findings: Secondary | ICD-10-CM | POA: Diagnosis not present

## 2019-03-05 DIAGNOSIS — R7309 Other abnormal glucose: Secondary | ICD-10-CM | POA: Diagnosis not present

## 2019-05-11 DIAGNOSIS — Z3202 Encounter for pregnancy test, result negative: Secondary | ICD-10-CM | POA: Diagnosis not present

## 2019-05-11 DIAGNOSIS — Z30432 Encounter for removal of intrauterine contraceptive device: Secondary | ICD-10-CM | POA: Diagnosis not present

## 2019-05-14 DIAGNOSIS — N939 Abnormal uterine and vaginal bleeding, unspecified: Secondary | ICD-10-CM | POA: Diagnosis not present

## 2019-05-14 DIAGNOSIS — Z6841 Body Mass Index (BMI) 40.0 and over, adult: Secondary | ICD-10-CM | POA: Diagnosis not present

## 2019-08-24 DIAGNOSIS — Z6841 Body Mass Index (BMI) 40.0 and over, adult: Secondary | ICD-10-CM | POA: Diagnosis not present

## 2019-08-24 DIAGNOSIS — E039 Hypothyroidism, unspecified: Secondary | ICD-10-CM | POA: Diagnosis not present

## 2019-08-24 DIAGNOSIS — Z01419 Encounter for gynecological examination (general) (routine) without abnormal findings: Secondary | ICD-10-CM | POA: Diagnosis not present

## 2019-10-17 DIAGNOSIS — N925 Other specified irregular menstruation: Secondary | ICD-10-CM | POA: Diagnosis not present

## 2020-02-17 ENCOUNTER — Emergency Department (HOSPITAL_COMMUNITY): Payer: BC Managed Care – PPO

## 2020-02-17 ENCOUNTER — Encounter (HOSPITAL_COMMUNITY): Payer: Self-pay | Admitting: Emergency Medicine

## 2020-02-17 ENCOUNTER — Other Ambulatory Visit: Payer: Self-pay

## 2020-02-17 ENCOUNTER — Emergency Department (HOSPITAL_COMMUNITY)
Admission: EM | Admit: 2020-02-17 | Discharge: 2020-02-17 | Disposition: A | Discharge status: Home / Self Care | Payer: BC Managed Care – PPO | Attending: Emergency Medicine | Admitting: Emergency Medicine

## 2020-02-17 DIAGNOSIS — X500XXA Overexertion from strenuous movement or load, initial encounter: Secondary | ICD-10-CM | POA: Insufficient documentation

## 2020-02-17 DIAGNOSIS — M545 Low back pain, unspecified: Secondary | ICD-10-CM | POA: Diagnosis not present

## 2020-02-17 DIAGNOSIS — S3992XA Unspecified injury of lower back, initial encounter: Secondary | ICD-10-CM | POA: Diagnosis not present

## 2020-02-17 DIAGNOSIS — E039 Hypothyroidism, unspecified: Secondary | ICD-10-CM | POA: Diagnosis not present

## 2020-02-17 DIAGNOSIS — S39012A Strain of muscle, fascia and tendon of lower back, initial encounter: Secondary | ICD-10-CM | POA: Diagnosis not present

## 2020-02-17 DIAGNOSIS — Z79899 Other long term (current) drug therapy: Secondary | ICD-10-CM | POA: Insufficient documentation

## 2020-02-17 DIAGNOSIS — M62830 Muscle spasm of back: Secondary | ICD-10-CM | POA: Insufficient documentation

## 2020-02-17 LAB — I-STAT BETA HCG BLOOD, ED (MC, WL, AP ONLY): I-stat hCG, quantitative: <5 m[IU]/mL (ref <5)

## 2020-02-17 MED ORDER — METHOCARBAMOL 500 MG PO TABS
1000.0000 mg | ORAL_TABLET | Freq: Once | ORAL | Status: AC
  Administered 2020-02-17: 1000 mg via ORAL
  Filled 2020-02-17: qty 2

## 2020-02-17 MED ORDER — METHOCARBAMOL 1000 MG/10ML IJ SOLN
1000.0000 mg | Freq: Once | INTRAMUSCULAR | Status: DC
  Filled 2020-02-17: qty 10

## 2020-02-17 MED ORDER — HYDROCODONE-ACETAMINOPHEN 5-325 MG PO TABS: 2.0000 | ORAL_TABLET | ORAL | 0 refills | Status: DC | PRN

## 2020-02-17 MED ORDER — CYCLOBENZAPRINE HCL 10 MG PO TABS: 5.0000 mg | ORAL_TABLET | Freq: Two times a day (BID) | ORAL | 0 refills | Status: DC | PRN

## 2020-02-17 MED ORDER — CELECOXIB 200 MG PO CAPS: 200.0000 mg | ORAL_CAPSULE | Freq: Two times a day (BID) | ORAL | 0 refills | Status: DC

## 2020-02-17 MED ORDER — HYDROCODONE-ACETAMINOPHEN 5-325 MG PO TABS
1.0000 | ORAL_TABLET | Freq: Once | ORAL | Status: AC
  Administered 2020-02-17: 1 via ORAL
  Filled 2020-02-17: qty 1

## 2020-02-17 MED ORDER — ONDANSETRON 4 MG PO TBDP
4.0000 mg | ORAL_TABLET | Freq: Once | ORAL | Status: AC
  Administered 2020-02-17: 4 mg via ORAL
  Filled 2020-02-17: qty 1

## 2020-02-17 NOTE — ED Notes (Signed)
Patient transported to X-ray 

## 2020-02-17 NOTE — Discharge Instructions (Addendum)
SEEK IMMEDIATE MEDICAL ATTENTION IF: New numbness, tingling, weakness, or problem with the use of your arms or legs.  Severe back pain not relieved with medications.  Change in bowel or bladder control.  Increasing pain in any areas of the body (such as chest or abdominal pain).  Shortness of breath, dizziness or fainting.  Nausea (feeling sick to your stomach), vomiting, fever, or sweats.  

## 2020-02-17 NOTE — ED Triage Notes (Signed)
Pt reports lower back pain x 2 weeks.  Initially injured it picking up her 33 yr old 2 weeks ago in snow.  Seemed to be getting better but worse pain since Friday.  Pain worse with standing.

## 2020-02-17 NOTE — ED Provider Notes (Signed)
[facility] EMERGENCY DEPARTMENT Provider Note   CSN: [id number] Arrival date & time: 02/17/20  5852     History Chief Complaint  Patient presents with  . Back Pain    Catherine Schroeder is a 35 y.o. female.  Who presents emergency department chief complaint of back pain.  Patient states that 2 weeks ago she was lifting up her 55-year-old when she had pain in her lower back.  Since that time she has had some pain particularly in the sacral and lumbar region.  2 days ago she had onset of severe worsening.  She states that the pain is from the mid back all the way through the sacrum.  She is unable to get up or change position without assistance.  The pain is severe if she tries to lean back at all.  She has pain with any movement of the lumbar region.  She denies saddle anesthesia, weakness of the lower extremities, numbness.  She has had strain injuries in the past during exercise but nothing has ever been this bad.  She has been using Tylenol, Motrin, lidocaine patches, IcyHot, heat, cold without any relief of her symptoms.  HPI     Past Medical History:  Diagnosis Date  . Gestational diabetes    diet controlled  . Gestational diabetes mellitus (GDM), antepartum   . Headache   . Hernia, ventral    MULTIPLE  . Hypothyroidism    during pregnancy only    Patient Active Problem List   Diagnosis Date Noted  . Postoperative state 12/21/2017  . ROM (rupture of membranes), premature 06/11/2015  . Normal vaginal delivery 06/11/2015  . Incisional hernia 11/12/2013  . Hypothyroidism 01/08/2011  . GERD (gastroesophageal reflux disease) 08/02/2010  . Pregnancy, supervision of normal 08/02/2010    Past Surgical History:  Procedure Laterality Date  . CESAREAN SECTION N/A 12/21/2017   Procedure: CESAREAN SECTION;  Surgeon: Carrington Clamp, MD;  Location: [facility] BIRTHING SUITES;  Service: Obstetrics;  Laterality: N/A;  . CHOLECYSTECTOMY  01/09/2011   Procedure: LAPAROSCOPIC  CHOLECYSTECTOMY WITH INTRAOPERATIVE CHOLANGIOGRAM;  Surgeon: Ernestene Mention, MD;  Location: [facility];  Service: General;  Laterality: N/A;  . ERCP  01/08/2011   Procedure: ENDOSCOPIC RETROGRADE CHOLANGIOPANCREATOGRAPHY (ERCP);  Surgeon: Rob Bunting, MD;  Location: [facility];  Service: Endoscopy;  Laterality: N/A;  . EUS  01/08/2011   Procedure: UPPER ENDOSCOPIC ULTRASOUND (EUS) RADIAL;  Surgeon: Rob Bunting, MD;  Location: [facility];  Service: Endoscopy;  Laterality: N/A;  Wants to look with radial EUS scope prior to ERCP  . HERNIA REPAIR     above umbilicus  . INSERTION OF MESH N/A 11/12/2013   Procedure: INSERTION OF MESH;  Surgeon: Claud Kelp, MD;  Location: [facility];  Service: General;  Laterality: N/A;  . VENTRAL HERNIA REPAIR N/A 11/12/2013   Procedure: LAPAROSCOPIC MULTIPLE  VENTRAL HERNIAS POSSIBLE OPEN;  Surgeon: Claud Kelp, MD;  Location: [facility];  Service: General;  Laterality: N/A;     OB History    Gravida  4   Para  3   Term  3   Preterm      AB      Living  3     SAB      IAB      Ectopic      Multiple  0   Live Births  3           Family History  Problem Relation Age of Onset  . Arthritis  Brother   . Diabetes Maternal Grandmother     Social History   Tobacco Use  . Smoking status: Never Smoker  . Smokeless tobacco: Never Used  Substance Use Topics  . Alcohol use: No  . Drug use: No    Home Medications Prior to Admission medications   Medication Sig Start Date End Date Taking? Authorizing Provider  levothyroxine (SYNTHROID, LEVOTHROID) 25 MCG tablet Take 25 mcg by mouth daily before breakfast. Pt thinks dose is 25    [provider]  oxyCODONE-acetaminophen (PERCOCET/ROXICET) 5-325 MG tablet Take 2 tablets by mouth every 4 (four) hours as needed (pain scale > 7). 12/24/17   Levi Aland, MD  prenatal vitamin w/FE, FA (PRENATAL 1 + 1) 27-1 MG TABS Take 1 tablet by mouth daily at 12 noon.     [provider]    Allergies    Patient has no known allergies.  Review of Systems   Review of Systems Ten systems reviewed and are negative for acute change, except as noted in the HPI.   Physical Exam Updated Vital Signs BP (!) 142/104 (BP Location: Right Arm)   Pulse (!) 105   Temp 98.7 F (37.1 C) (Oral)   Resp 18   Ht 5\' 4"  (1.626 m)   Wt 108.9 kg   LMP 02/03/2020   SpO2 98%   BMI 41.20 kg/m   Physical Exam Vitals and nursing note reviewed.  Constitutional:      General: She is not in acute distress.    Appearance: She is well-developed and well-nourished. She is not diaphoretic.  HENT:     Head: Normocephalic and atraumatic.  Eyes:     General: No scleral icterus.    Conjunctiva/sclera: Conjunctivae normal.  Cardiovascular:     Rate and Rhythm: Normal rate and regular rhythm.     Heart sounds: Normal heart sounds. No murmur heard. No friction rub. No gallop.   Pulmonary:     Effort: Pulmonary effort is normal. No respiratory distress.     Breath sounds: Normal breath sounds.  Abdominal:     General: Bowel sounds are normal. There is no distension.     Palpations: Abdomen is soft. There is no mass.     Tenderness: There is no abdominal tenderness. There is no guarding.  Musculoskeletal:     Cervical back: Normal range of motion.     Thoracic back: Spasms and tenderness present.     Lumbar back: Spasms and tenderness present. No bony tenderness. Decreased range of motion. Negative left straight leg raise test.       Back:  Skin:    General: Skin is warm and dry.  Neurological:     Mental Status: She is alert and oriented to person, place, and time.  Psychiatric:        Behavior: Behavior normal.     ED Results / Procedures / Treatments   Labs (all labs ordered are listed, but only abnormal results are displayed) Labs Reviewed  URINALYSIS, ROUTINE W REFLEX MICROSCOPIC  I-STAT BETA HCG BLOOD, ED ([facility], [facility], [facility] ONLY)    EKG None  Radiology No results  found.  Procedures Procedures   Medications Ordered in ED Medications  HYDROcodone-acetaminophen (NORCO/VICODIN) 5-325 MG per tablet 1 tablet (has no administration in time range)  ondansetron (ZOFRAN-ODT) disintegrating tablet 4 mg (has no administration in time range)  methocarbamol (ROBAXIN) injection 1,000 mg (has no administration in time range)    ED Course  I have  reviewed the triage vital signs and the nursing notes.  Pertinent labs & imaging results that were available during my care of the patient were reviewed by me and considered in my medical decision making (see chart for details).    MDM Rules/Calculators/A&P                          Patient with back pain.  I ordered and reviewed complete lumbar spine x-ray which shows no acute abnormalities. No neurological deficits and normal neuro exam.  Patient can walk but states it  is painful.  No loss of bowel or bladder control. No concern for cauda equina.  No fever, night sweats, weight loss, h/o cancer, IVDU.  RICE protocol and pain medicine indicated and discussed with patient.   PDMP reviewed during this encounter.  Final Clinical Impression(s) / ED Diagnoses Final diagnoses:  None    Rx / DC Orders ED Discharge Orders    None       Arthor Captain, PA-C 02/17/20 1700    Gwyneth Sprout, MD 02/21/20 [phone]

## 2020-04-02 DIAGNOSIS — Z20822 Contact with and (suspected) exposure to covid-19: Secondary | ICD-10-CM | POA: Diagnosis not present

## 2020-09-19 DIAGNOSIS — E039 Hypothyroidism, unspecified: Secondary | ICD-10-CM | POA: Diagnosis not present

## 2020-09-19 DIAGNOSIS — Z3202 Encounter for pregnancy test, result negative: Secondary | ICD-10-CM | POA: Diagnosis not present

## 2020-09-19 DIAGNOSIS — Z01419 Encounter for gynecological examination (general) (routine) without abnormal findings: Secondary | ICD-10-CM | POA: Diagnosis not present

## 2020-10-28 DIAGNOSIS — O3680X9 Pregnancy with inconclusive fetal viability, other fetus: Secondary | ICD-10-CM | POA: Diagnosis not present

## 2020-10-30 DIAGNOSIS — E039 Hypothyroidism, unspecified: Secondary | ICD-10-CM | POA: Diagnosis not present

## 2020-10-30 DIAGNOSIS — O3680X9 Pregnancy with inconclusive fetal viability, other fetus: Secondary | ICD-10-CM | POA: Diagnosis not present

## 2020-11-06 DIAGNOSIS — O3680X9 Pregnancy with inconclusive fetal viability, other fetus: Secondary | ICD-10-CM | POA: Diagnosis not present

## 2020-11-25 DIAGNOSIS — E039 Hypothyroidism, unspecified: Secondary | ICD-10-CM | POA: Diagnosis not present

## 2020-11-25 DIAGNOSIS — Z6841 Body Mass Index (BMI) 40.0 and over, adult: Secondary | ICD-10-CM | POA: Diagnosis not present

## 2021-02-04 DIAGNOSIS — Z3201 Encounter for pregnancy test, result positive: Secondary | ICD-10-CM | POA: Diagnosis not present

## 2021-02-10 DIAGNOSIS — Z6841 Body Mass Index (BMI) 40.0 and over, adult: Secondary | ICD-10-CM | POA: Diagnosis not present

## 2021-02-10 DIAGNOSIS — N970 Female infertility associated with anovulation: Secondary | ICD-10-CM | POA: Diagnosis not present

## 2021-02-13 DIAGNOSIS — Z0000 Encounter for general adult medical examination without abnormal findings: Secondary | ICD-10-CM | POA: Diagnosis not present

## 2021-03-15 ENCOUNTER — Inpatient Hospital Stay (HOSPITAL_COMMUNITY)
Admission: AD | Admit: 2021-03-15 | Discharge: 2021-03-16 | Disposition: A | Discharge status: Home / Self Care | Payer: BC Managed Care – PPO | Attending: Obstetrics & Gynecology | Admitting: Obstetrics & Gynecology

## 2021-03-15 ENCOUNTER — Other Ambulatory Visit: Payer: Self-pay

## 2021-03-15 DIAGNOSIS — O209 Hemorrhage in early pregnancy, unspecified: Secondary | ICD-10-CM

## 2021-03-15 DIAGNOSIS — Z3A01 Less than 8 weeks gestation of pregnancy: Secondary | ICD-10-CM | POA: Insufficient documentation

## 2021-03-15 DIAGNOSIS — O200 Threatened abortion: Secondary | ICD-10-CM

## 2021-03-15 DIAGNOSIS — O26851 Spotting complicating pregnancy, first trimester: Secondary | ICD-10-CM | POA: Insufficient documentation

## 2021-03-15 DIAGNOSIS — O10911 Unspecified pre-existing hypertension complicating pregnancy, first trimester: Secondary | ICD-10-CM | POA: Insufficient documentation

## 2021-03-15 DIAGNOSIS — O26891 Other specified pregnancy related conditions, first trimester: Secondary | ICD-10-CM | POA: Insufficient documentation

## 2021-03-15 DIAGNOSIS — O3680X0 Pregnancy with inconclusive fetal viability, not applicable or unspecified: Secondary | ICD-10-CM | POA: Insufficient documentation

## 2021-03-15 DIAGNOSIS — R1031 Right lower quadrant pain: Secondary | ICD-10-CM | POA: Insufficient documentation

## 2021-03-15 DIAGNOSIS — Z7901 Long term (current) use of anticoagulants: Secondary | ICD-10-CM | POA: Insufficient documentation

## 2021-03-15 DIAGNOSIS — O10919 Unspecified pre-existing hypertension complicating pregnancy, unspecified trimester: Secondary | ICD-10-CM

## 2021-03-16 ENCOUNTER — Inpatient Hospital Stay (HOSPITAL_COMMUNITY): Payer: BC Managed Care – PPO

## 2021-03-16 ENCOUNTER — Encounter (HOSPITAL_COMMUNITY): Payer: Self-pay

## 2021-03-16 DIAGNOSIS — R1031 Right lower quadrant pain: Secondary | ICD-10-CM | POA: Diagnosis not present

## 2021-03-16 DIAGNOSIS — Z7901 Long term (current) use of anticoagulants: Secondary | ICD-10-CM | POA: Diagnosis not present

## 2021-03-16 DIAGNOSIS — N939 Abnormal uterine and vaginal bleeding, unspecified: Secondary | ICD-10-CM | POA: Diagnosis not present

## 2021-03-16 DIAGNOSIS — O26851 Spotting complicating pregnancy, first trimester: Secondary | ICD-10-CM | POA: Diagnosis not present

## 2021-03-16 DIAGNOSIS — O26891 Other specified pregnancy related conditions, first trimester: Secondary | ICD-10-CM | POA: Diagnosis not present

## 2021-03-16 DIAGNOSIS — O3680X0 Pregnancy with inconclusive fetal viability, not applicable or unspecified: Secondary | ICD-10-CM

## 2021-03-16 DIAGNOSIS — O200 Threatened abortion: Secondary | ICD-10-CM | POA: Diagnosis not present

## 2021-03-16 DIAGNOSIS — Z3A01 Less than 8 weeks gestation of pregnancy: Secondary | ICD-10-CM

## 2021-03-16 DIAGNOSIS — O10911 Unspecified pre-existing hypertension complicating pregnancy, first trimester: Secondary | ICD-10-CM | POA: Diagnosis not present

## 2021-03-16 DIAGNOSIS — O209 Hemorrhage in early pregnancy, unspecified: Secondary | ICD-10-CM | POA: Diagnosis not present

## 2021-03-16 LAB — WET PREP, GENITAL
Clue Cells Wet Prep HPF POC: NONE SEEN
Sperm: NONE SEEN
Trich, Wet Prep: NONE SEEN
WBC, Wet Prep HPF POC: <10 (ref <10)
Yeast Wet Prep HPF POC: NONE SEEN

## 2021-03-16 LAB — GC/CHLAMYDIA PROBE AMP (~~LOC~~) NOT AT ARMC
Chlamydia: NEGATIVE
Comment: NEGATIVE
Comment: NORMAL
Neisseria Gonorrhea: NEGATIVE

## 2021-03-16 LAB — URINALYSIS, ROUTINE W REFLEX MICROSCOPIC
Bilirubin Urine: NEGATIVE
Glucose, UA: NEGATIVE mg/dL
Ketones, ur: NEGATIVE mg/dL
Leukocytes,Ua: NEGATIVE
Nitrite: NEGATIVE
Protein, ur: NEGATIVE mg/dL
Specific Gravity, Urine: 1.003 — ABNORMAL LOW (ref 1.005–1.030)
pH: 7 (ref 5.0–8.0)

## 2021-03-16 LAB — CBC
HCT: 38.2 % (ref 36.0–46.0)
Hemoglobin: 13.2 g/dL (ref 12.0–15.0)
MCH: 30.7 pg (ref 26.0–34.0)
MCHC: 34.6 g/dL (ref 30.0–36.0)
MCV: 88.8 fL (ref 80.0–100.0)
Platelets: 322 10*3/uL (ref 150–400)
RBC: 4.3 MIL/uL (ref 3.87–5.11)
RDW: 13.7 % (ref 11.5–15.5)
WBC: 10.5 10*3/uL (ref 4.0–10.5)
nRBC: 0 % (ref 0.0–0.2)

## 2021-03-16 LAB — POCT PREGNANCY, URINE: Preg Test, Ur: POSITIVE — AB

## 2021-03-16 LAB — HIV ANTIBODY (ROUTINE TESTING W REFLEX): HIV Screen 4th Generation wRfx: NONREACTIVE

## 2021-03-16 LAB — HCG, QUANTITATIVE, PREGNANCY: hCG, Beta Chain, Quant, S: 958 m[IU]/mL — ABNORMAL HIGH (ref <5)

## 2021-03-16 MED ORDER — LABETALOL HCL 200 MG PO TABS: 100.0000 mg | ORAL_TABLET | Freq: Two times a day (BID) | ORAL | 0 refills | Status: DC

## 2021-03-16 MED ORDER — LABETALOL HCL 100 MG PO TABS
200.0000 mg | ORAL_TABLET | Freq: Once | ORAL | Status: AC
  Administered 2021-03-16: 200 mg via ORAL
  Filled 2021-03-16: qty 2

## 2021-03-16 NOTE — Discharge Instructions (Addendum)
You will need to have a repeat HCG level to determine progress of blood pregnancy hormone level on Wednesday 03/18/2021.   You will need a blood pressure check in 2 weeks (around the time of your scheduled early glucose tolerance test). Please call [facility] to get that scheduled with your appointment.

## 2021-03-16 NOTE — MAU Note (Signed)
Pt reports to MAU with c/o vaginal bleeding that started after her and husband were having intercourse tonight.  Pt states that she feels like "pulling pain" on right side.  Pt reports LMP 02/01/2021

## 2021-03-16 NOTE — MAU Provider Note (Signed)
History     CSN: [id number]  Arrival date and time: 03/15/21 2346   Event Date/Time   First Provider Initiated Contact with Patient 03/16/21 0045      Chief Complaint  Patient presents with   Vaginal Bleeding   Catherine Schroeder is a 36 y.o. year old [id number] female at [redacted]w[redacted]d weeks gestation by LMP (02/01/2021) who presents to MAU reporting vaginal bleeding after having SI with husband and a "pulling pain" on lower RT side. She is concerned because she just had a SAB in October 2022. She reports no complications with SAB last year. She denies any prior dx of cHTN, but does remember having one a couple of years ago at an [place] office and at recent appointment with [facility]. She receives [facility] with [facility]; next appt is 04/01/2021 (early GTT) and 04/03/2021.   OB History     Gravida  6   Para  4   Term  3   Preterm  1   AB  1   Living  4      SAB  1   IAB      Ectopic      Multiple  0   Live Births  4           Past Medical History:  Diagnosis Date   Gestational diabetes    diet controlled   Gestational diabetes mellitus (GDM), antepartum    Headache    Hernia, ventral    MULTIPLE   Hypothyroidism    during pregnancy only    Past Surgical History:  Procedure Laterality Date   CESAREAN SECTION N/A 12/21/2017   Procedure: CESAREAN SECTION;  Surgeon: Bobbye Charleston, MD;  Location: [place];  Service: Obstetrics;  Laterality: N/A;   CHOLECYSTECTOMY  01/09/2011   Procedure: LAPAROSCOPIC CHOLECYSTECTOMY WITH INTRAOPERATIVE CHOLANGIOGRAM;  Surgeon: Adin Hector, MD;  [facility];  Service: General;  Laterality: N/A;   ERCP  01/08/2011   Procedure: ENDOSCOPIC RETROGRADE CHOLANGIOPANCREATOGRAPHY (ERCP);  Surgeon: Owens Loffler, MD;  Location: [facility];  Service: Endoscopy;  Laterality: N/A;   EUS  01/08/2011   Procedure: UPPER ENDOSCOPIC ULTRASOUND (EUS) RADIAL;  Surgeon: Owens Loffler, MD;  [facility];  Service:  Endoscopy;  Laterality: N/A;  Wants to look with radial EUS scope prior to ERCP   HERNIA REPAIR     above umbilicus   INSERTION OF MESH N/A 11/12/2013   Procedure: INSERTION OF MESH;  Surgeon: Fanny Skates, MD;  Location: [facility];  Service: General;  Laterality: N/A;   VENTRAL HERNIA REPAIR N/A 11/12/2013   Procedure: LAPAROSCOPIC MULTIPLE  VENTRAL HERNIAS POSSIBLE OPEN;  Surgeon: Fanny Skates, MD;  Location: [facility];  Service: General;  Laterality: N/A;    Family History  Problem Relation Age of Onset   Arthritis Brother    Diabetes Maternal Grandmother     Social History   Tobacco Use   Smoking status: Never   Smokeless tobacco: Never  Substance Use Topics   Alcohol use: No   Drug use: No    Allergies: No Known Allergies  Medications Prior to Admission  Medication Sig Dispense Refill Last Dose   levothyroxine (SYNTHROID, LEVOTHROID) 25 MCG tablet Take 25 mcg by mouth daily before breakfast. Pt thinks dose is 25   03/16/2021   prenatal vitamin w/FE, FA (PRENATAL 1 + 1) 27-1 MG TABS Take 1 tablet by mouth daily at 12 noon.    03/16/2021   celecoxib (CELEBREX) 200 MG  capsule Take 1 capsule (200 mg total) by mouth 2 (two) times daily. 20 capsule 0    cyclobenzaprine (FLEXERIL) 10 MG tablet Take 0.5-1 tablets (5-10 mg total) by mouth 2 (two) times daily as needed for muscle spasms. 20 tablet 0    HYDROcodone-acetaminophen (NORCO) 5-325 MG tablet Take 2 tablets by mouth every 4 (four) hours as needed. 10 tablet 0    oxyCODONE-acetaminophen (PERCOCET/ROXICET) 5-325 MG tablet Take 2 tablets by mouth every 4 (four) hours as needed (pain scale > 7). 30 tablet 0     Review of Systems  Constitutional: Negative.   HENT: Negative.    Eyes: Negative.   Respiratory: Negative.    Cardiovascular: Negative.   Gastrointestinal: Negative.   Endocrine: Negative.   Genitourinary:  Positive for pelvic pain (RT sided "pulling pain") and vaginal bleeding (after SI).  Musculoskeletal: Negative.    Skin: Negative.   Allergic/Immunologic: Negative.   Neurological: Negative.   Hematological: Negative.   Psychiatric/Behavioral: Negative.    Physical Exam   Patient Vitals for the past 24 hrs:  BP Temp Temp src Pulse Resp SpO2 Height Weight  03/16/21 0310 131/63 -- -- (!) 105 -- -- -- --  03/16/21 0243 (!) 116/58 -- -- 99 16 -- -- --  03/16/21 0030 (!) 153/98 98.7 F (37.1 C) Oral 95 17 100 % -- --  03/16/21 0006 (!) 140/104 98.8 F (37.1 C) Oral (!) 102 18 100 % 5\' 4"  (1.626 m) 110.4 kg    Physical Exam Vitals and nursing note reviewed. Exam conducted with a chaperone present.  Constitutional:      Appearance: Normal appearance. She is obese.  Cardiovascular:     Rate and Rhythm: Tachycardia present.     Pulses: Normal pulses.  Pulmonary:     Effort: Pulmonary effort is normal.  Abdominal:     Palpations: Abdomen is soft.  Genitourinary:    General: Normal vulva.     Comments: Pelvic exam: External genitalia normal, SE: vaginal walls pink and well rugated, cervix is smooth, pink, no lesions, scant amt of dark, red blood in vagina -- WP, GC/CT done, cervix visually closed, Uterus is non-tender, no CMT or friability, no adnexal tenderness. Musculoskeletal:        General: Normal range of motion.  Skin:    General: Skin is warm and dry.  Neurological:     Mental Status: She is alert and oriented to person, place, and time.  Psychiatric:        Mood and Affect: Mood normal.        Behavior: Behavior normal.        Thought Content: Thought content normal.        Judgment: Judgment normal.    MAU Course  Procedures  MDM CCUA UPT CBC ABO/Rh -- not drawn, O Positive known HCG Wet Prep GC/CT -- pending HIV -- pending OB < 14 wks [place] with TV Labetalol 200 mg po -- improved BP after 1 hour  Results for orders placed or performed during the hospital encounter of 03/15/21 (from the past 24 hour(s))  Pregnancy, urine POC     Status: Abnormal   Collection Time:  03/16/21 12:03 AM  Result Value Ref Range   Preg Test, Ur POSITIVE (A) NEGATIVE  Wet prep, genital     Status: None   Collection Time: 03/16/21 12:53 AM   Specimen: PATH Cytology Cervicovaginal Ancillary Only  Result Value Ref Range   Yeast Wet Prep HPF POC NONE SEEN NONE  SEEN   Trich, Wet Prep NONE SEEN NONE SEEN   Clue Cells Wet Prep HPF POC NONE SEEN NONE SEEN   WBC, Wet Prep HPF POC <10 <10   Sperm NONE SEEN   CBC     Status: None   Collection Time: 03/16/21  1:06 AM  Result Value Ref Range   WBC 10.5 4.0 - 10.5 K/uL   RBC 4.30 3.87 - 5.11 MIL/uL   Hemoglobin 13.2 12.0 - 15.0 g/dL   HCT 38.2 36.0 - 46.0 %   MCV 88.8 80.0 - 100.0 fL   MCH 30.7 26.0 - 34.0 pg   MCHC 34.6 30.0 - 36.0 g/dL   RDW 13.7 11.5 - 15.5 %   Platelets 322 150 - 400 K/uL   nRBC 0.0 0.0 - 0.2 %  hCG, quantitative, pregnancy     Status: Abnormal   Collection Time: 03/16/21  1:06 AM  Result Value Ref Range   hCG, Beta Chain, Quant, S 958 (H) <5 mIU/mL  HIV Antibody (routine testing w rflx)     Status: None   Collection Time: 03/16/21  1:06 AM  Result Value Ref Range   HIV Screen 4th Generation wRfx Non Reactive Non Reactive  Urinalysis, Routine w reflex microscopic     Status: Abnormal   Collection Time: 03/16/21  1:21 AM  Result Value Ref Range   Color, Urine STRAW (A) YELLOW   APPearance CLEAR CLEAR   Specific Gravity, Urine 1.003 (L) 1.005 - 1.030   pH 7.0 5.0 - 8.0   Glucose, UA NEGATIVE NEGATIVE mg/dL   Hgb urine dipstick MODERATE (A) NEGATIVE   Bilirubin Urine NEGATIVE NEGATIVE   Ketones, ur NEGATIVE NEGATIVE mg/dL   Protein, ur NEGATIVE NEGATIVE mg/dL   Nitrite NEGATIVE NEGATIVE   Leukocytes,Ua NEGATIVE NEGATIVE   RBC / HPF 0-5 0 - 5 RBC/hpf   WBC, UA 0-5 0 - 5 WBC/hpf   Bacteria, UA RARE (A) NONE SEEN   Squamous Epithelial / LPF 0-5 0 - 5    US OB LESS THAN 14 WEEKS WITH OB TRANSVAGINAL  Result Date: 03/16/2021 CLINICAL DATA:  Vaginal bleeding and pelvic pain. LMP: 02/01/2021  corresponding to an estimated gestational age of [redacted] weeks, 1 day. EXAM: OBSTETRIC <14 WK [place] AND TRANSVAGINAL OB US TECHNIQUE: Both transabdominal and transvaginal ultrasound examinations were performed for complete evaluation of the gestation as well as the maternal uterus, adnexal regions, and pelvic cul-de-sac. Transvaginal technique was performed to assess early pregnancy. COMPARISON:  None. FINDINGS: The uterus is retroverted. The endometrium measures approximately 14 mm in thickness. There is a sac-like structure in the upper endometrium. No yolk sac or fetal pole identified. This may represent an early gestational sac. However, a blighted ovum or a pseudo gestational of an ectopic pregnancy is not entirely excluded. If this cystic structure is a true gestational sac, the estimated gestational age based on mean sac diameter of 3 mm is 4 weeks, 6 days. The ovaries are unremarkable. IMPRESSION: Probable early gestational sac measuring at 4 weeks, 6 days. Follow-up with ultrasound in 7-11 days, or earlier if clinically indicated, recommended. Electronically Signed   By: Anner Crete M.D.   On: 03/16/2021 01:53     Assessment and Plan  Pregnancy of unknown anatomic location  - Explained that there was a gestational sac see on U/S, but no fetal pole - Will need repeat U/S in 7-10 days - Cannot exclude ectopic pregnancy or SAB  Chronic hypertension affecting pregnancy - Rx for  Labetalol 200 mg BID  - Advised patient to get BP cuff to take BP daily and record on BP log sheet - Advised to take sheet to appt on 04/01/2021 for provider to review - Information provided on how to take BP, HTN in pregnancy, BP log sheet  Spotting affecting pregnancy in first trimester - Advised to return to MAU for heavier bleeding and/or pain not relieved by Tylenol - Information provided on VB in 1st trimester   Threatened miscarriage in early pregnancy  - Information provided on threatened miscarriage   [redacted] weeks  gestation of pregnancy  - Discharge patient - Call to schedule appt for repeat HCG level on 03/18/2021 - Keep scheduled appt on 04/01/2021 for early GTT; also add on BP check - Patient verbalized an understanding of the plan of care and agrees.    Laury Deep, CNM 03/16/2021, 12:45 AM

## 2021-03-18 DIAGNOSIS — O3680X9 Pregnancy with inconclusive fetal viability, other fetus: Secondary | ICD-10-CM | POA: Diagnosis not present

## 2021-03-24 DIAGNOSIS — N925 Other specified irregular menstruation: Secondary | ICD-10-CM | POA: Diagnosis not present

## 2021-04-03 DIAGNOSIS — E039 Hypothyroidism, unspecified: Secondary | ICD-10-CM | POA: Diagnosis not present

## 2021-04-03 DIAGNOSIS — Z3480 Encounter for supervision of other normal pregnancy, unspecified trimester: Secondary | ICD-10-CM | POA: Diagnosis not present

## 2021-04-03 DIAGNOSIS — Z113 Encounter for screening for infections with a predominantly sexual mode of transmission: Secondary | ICD-10-CM | POA: Diagnosis not present

## 2021-04-03 DIAGNOSIS — O3680X9 Pregnancy with inconclusive fetal viability, other fetus: Secondary | ICD-10-CM | POA: Diagnosis not present

## 2021-04-03 DIAGNOSIS — Z8632 Personal history of gestational diabetes: Secondary | ICD-10-CM | POA: Diagnosis not present

## 2021-04-08 ENCOUNTER — Encounter: Payer: BC Managed Care – PPO | Attending: Obstetrics and Gynecology | Admitting: Registered"

## 2021-04-08 ENCOUNTER — Other Ambulatory Visit: Payer: Self-pay

## 2021-04-08 DIAGNOSIS — O24419 Gestational diabetes mellitus in pregnancy, unspecified control: Secondary | ICD-10-CM | POA: Insufficient documentation

## 2021-04-12 DIAGNOSIS — R6883 Chills (without fever): Secondary | ICD-10-CM | POA: Diagnosis not present

## 2021-04-12 DIAGNOSIS — R52 Pain, unspecified: Secondary | ICD-10-CM | POA: Diagnosis not present

## 2021-04-12 DIAGNOSIS — Z03818 Encounter for observation for suspected exposure to other biological agents ruled out: Secondary | ICD-10-CM | POA: Diagnosis not present

## 2021-04-12 DIAGNOSIS — J039 Acute tonsillitis, unspecified: Secondary | ICD-10-CM | POA: Diagnosis not present

## 2021-04-13 ENCOUNTER — Encounter: Payer: Self-pay | Admitting: Registered"

## 2021-04-13 NOTE — Progress Notes (Signed)
Patient was seen on 04/08/21 for Gestational Diabetes self-management class at the [facility]. The following learning objectives were met by the patient during this course: ? ?States the definition of Gestational Diabetes ?States why dietary management is important in controlling blood glucose ?Describes the effects each nutrient has on blood glucose levels ?Demonstrates ability to create a balanced meal plan ?Demonstrates carbohydrate counting  ?States when to check blood glucose levels ?Demonstrates proper blood glucose monitoring techniques ?States the effect of stress and exercise on blood glucose levels ?States the importance of limiting caffeine and abstaining from alcohol and smoking ? ?Blood glucose monitor given: OneTouch Verio Flex ?Lot# [id number] x ?Exp: 06/17/25 ?Blood Glucose: 99 mg/dL ? ?Patient instructed to monitor glucose levels: ?FBS: 60 - <95; 1 hour: <140; 2 hour: <120 ? ?Patient received handouts: ?Nutrition Diabetes and Pregnancy, including carb counting list ? ?Patient will be seen for follow-up as needed. ?

## 2021-04-22 ENCOUNTER — Encounter (HOSPITAL_BASED_OUTPATIENT_CLINIC_OR_DEPARTMENT_OTHER): Payer: Self-pay | Admitting: Obstetrics and Gynecology

## 2021-04-22 ENCOUNTER — Other Ambulatory Visit: Payer: Self-pay | Admitting: Obstetrics and Gynecology

## 2021-04-22 DIAGNOSIS — O021 Missed abortion: Secondary | ICD-10-CM | POA: Diagnosis not present

## 2021-04-22 DIAGNOSIS — Z01818 Encounter for other preprocedural examination: Secondary | ICD-10-CM

## 2021-04-22 DIAGNOSIS — O3680X9 Pregnancy with inconclusive fetal viability, other fetus: Secondary | ICD-10-CM | POA: Diagnosis not present

## 2021-04-22 MED ORDER — DOXYCYCLINE HYCLATE 100 MG IV SOLR
100.0000 mg | Freq: Two times a day (BID) | INTRAVENOUS | Status: AC
  Administered 2021-04-23 – 2021-05-06 (×2): 100 mg via INTRAVENOUS

## 2021-04-22 NOTE — Anesthesia Preprocedure Evaluation (Addendum)
Anesthesia Evaluation  ?Patient identified by MRN, date of birth, ID band ?Patient awake ? ? ? ?Reviewed: ?Allergy & Precautions, NPO status , Patient's Chart, lab work & pertinent test results ? ?Airway ?Mallampati: II ? ?TM Distance: >3 FB ?Neck ROM: Full ? ? ? Dental ?no notable dental hx. ?(+) Teeth Intact, Dental Advisory Given ?  ?Pulmonary ?neg pulmonary ROS,  ?  ?Pulmonary exam normal ?breath sounds clear to auscultation ? ? ? ? ? ? Cardiovascular ?hypertension, Normal cardiovascular exam ?Rhythm:Regular Rate:Normal ? ?Hx/o PIH ?  ?Neuro/Psych ? Headaches, negative psych ROS  ? GI/Hepatic ?GERD  ,GERD with pregnancy ?  ?Endo/Other  ?diabetes, GestationalHypothyroidism Obesity ? Renal/GU ?  ?negative genitourinary ?  ?Musculoskeletal ?negative musculoskeletal ROS ?(+)  ? Abdominal ?(+) + obese,   ?Peds ? Hematology ?negative hematology ROS ?(+)   ?Anesthesia Other Findings ? ? Reproductive/Obstetrics ?(+) Pregnancy ?Missed Ab ? ?  ? ? ? ? ? ? ? ? ? ? ? ? ? ?  ?  ? ? ? ? ? ? ? ?Anesthesia Physical ?Anesthesia Plan ? ?ASA: 2 ? ?Anesthesia Plan: General  ? ?Post-op Pain Management:   ? ?Induction: Intravenous ? ?PONV Risk Score and Plan: 4 or greater and Treatment may vary due to age or medical condition and Ondansetron ? ?Airway Management Planned: LMA ? ?Additional Equipment: None ? ?Intra-op Plan:  ? ?Post-operative Plan: Extubation in OR ? ?Informed Consent: I have reviewed the patients History and Physical, chart, labs and discussed the procedure including the risks, benefits and alternatives for the proposed anesthesia with the patient or authorized representative who has indicated his/her understanding and acceptance.  ? ? ? ?Dental advisory given ? ?Plan Discussed with: CRNA and Anesthesiologist ? ?Anesthesia Plan Comments:   ? ? ? ? ? ?Anesthesia Quick Evaluation ? ?

## 2021-04-22 NOTE — H&P (Signed)
36 y.o. [id number] with confirmed missed ab at about 8 5/7 weeks; should be almost 10.  No FHTs. ? ?Past Medical History:  ?Diagnosis Date  ? Gestational diabetes mellitus (GDM), antepartum   ? diet controlled  ? Gestational hypertension   ? Headache   ? History of COVID-19 2021  ? mild all symptoms resolved  ? Hypothyroidism   ? during pregnancy only  ?Now with type 2 dm and CHTN.  ?Past Surgical History:  ?Procedure Laterality Date  ? CESAREAN SECTION N/A 12/21/2017  ? Procedure: CESAREAN SECTION;  Surgeon: Bobbye Charleston, MD;  Location: [facility];  Service: Obstetrics;  Laterality: N/A;  ? CHOLECYSTECTOMY  01/09/2011  ? Procedure: LAPAROSCOPIC CHOLECYSTECTOMY WITH INTRAOPERATIVE CHOLANGIOGRAM;  Surgeon: Adin Hector, MD;  Location: [facility];  Service: General;  Laterality: N/A;  ? ERCP  01/08/2011  ? Procedure: ENDOSCOPIC RETROGRADE CHOLANGIOPANCREATOGRAPHY (ERCP);  Surgeon: Owens Loffler, MD;  Location: [facility];  Service: Endoscopy;  Laterality: N/A;  ? EUS  01/08/2011  ? Procedure: UPPER ENDOSCOPIC ULTRASOUND (EUS) RADIAL;  Surgeon: Owens Loffler, MD;  Location: [facility];  Service: Endoscopy;  Laterality: N/A;  Wants to look with radial EUS scope prior to ERCP  ? HERNIA REPAIR    ? above umbilicus  ? INSERTION OF MESH N/A 11/12/2013  ? Procedure: INSERTION OF MESH;  Surgeon: Fanny Skates, MD;  Location: [facility];  Service: General;  Laterality: N/A;  ? VENTRAL HERNIA REPAIR N/A 11/12/2013  ? Procedure: LAPAROSCOPIC MULTIPLE  VENTRAL HERNIAS POSSIBLE OPEN;  Surgeon: Fanny Skates, MD;  Location: [facility];  Service: General;  Laterality: N/A;  ?  ?Social History  ? ?Socioeconomic History  ? Marital status: Married  ?  Spouse name: Not on file  ? Number of children: Not on file  ? Years of education: Not on file  ? Highest education level: Not on file  ?Occupational History  ? Not on file  ?Tobacco Use  ? Smoking status: Never  ? Smokeless tobacco: Never  ?Vaping Use  ? Vaping Use: Never used   ?Substance and Sexual Activity  ? Alcohol use: No  ? Drug use: No  ? Sexual activity: Yes  ?  Comment: pregnant  ?Other Topics Concern  ? Not on file  ?Social History Narrative  ? Not on file  ? ?Social Determinants of Health  ? ?Financial Resource Strain: Not on file  ?Food Insecurity: No Food Insecurity  ? Worried About Charity fundraiser in the Last Year: Never true  ? Ran Out of Food in the Last Year: Never true  ?Transportation Needs: Not on file  ?Physical Activity: Not on file  ?Stress: Not on file  ?Social Connections: Not on file  ?Intimate Partner Violence: Not on file  ? ? ?No current facility-administered medications on file prior to encounter.  ? ?Current Outpatient Medications on File Prior to Encounter  ?Medication Sig Dispense Refill  ? labetalol (NORMODYNE) 200 MG tablet Take 0.5 tablets (100 mg total) by mouth 2 (two) times daily. 60 tablet 0  ? levothyroxine (SYNTHROID, LEVOTHROID) 25 MCG tablet Take 25 mcg by mouth daily before breakfast. Pt thinks dose is 25    ? prenatal vitamin w/FE, FA (PRENATAL 1 + 1) 27-1 MG TABS Take 1 tablet by mouth daily at 12 noon.     ? ? ?No Known Allergies ? ?Vitals:  ? 04/22/21 1056  ?Weight: 103.9 kg  ?Height: 5\' 4"  (1.626 m)  ? ? ?Lungs: clear to ascultation ?Cor:  RRR ?Abdomen:  soft, nontender, nondistended. ?Ex:  no cords, erythema ?Pelvic:   ?Vulva: no masses, no atrophy, no lesions ?Vagina: no tenderness, no erythema, no abnormal vaginal discharge, no vesicle(s) or ulcers, no cystocele, no rectocele ?Cervix: grossly normal, no discharge, no cervical motion tenderness ?Uterus: normal size (8), normal shape, midline, no uterine prolapse, non-tender ?Bladder/Urethra: normal meatus, no urethral discharge, no urethral mass, bladder non distended, Urethra well supported ?Adnexa/Parametria: no parametrial tenderness, no parametrial mass, no adnexal tenderness, no ovarian mass ? ? ?A:  For dilation and evacuation for missed ab at 9 weeks.  ? ? ?P: P: All risks,  benefits and alternatives d/w patient and she desires to proceed.  Patient has undergone ERAS protocol and will receive preop antibiotics and SCDs during the operation.    ?Daria Pastures  ?

## 2021-04-22 NOTE — Progress Notes (Addendum)
Spoke w/ via phone for pre-op interview---pt ?Lab needs dos----istat, ekg , surgery orders pending              ?Lab results------cbc 03-16-2021 epic ?COVID test -----patient states asymptomatic no test needed ?Arrive at -------1000 am 04-23-2021 ?NPO after MN NO Solid Food.  Clear liquids from MN until---900 am ?Med rec completed ?Medications to take morning of surgery -----labetalol, levothyroxineL ?Diabetic medication-diet controlled gestational diabetes ?Patient instructed no nail polish to be worn day of surgery ?Patient instructed to bring photo id and insurance card day of surgery ?Patient aware to have Driver (ride ) / caregiver   michael spouse will stay  for 24 hours after surgery  ?Patient Special Instructions -----none ?Pre-Op special Istructions -----none ?Patient verbalized understanding of instructions that were given at this phone interview. ?Patient denies shortness of breath, chest pain, fever, cough at this phone interview.  ?

## 2021-04-23 ENCOUNTER — Other Ambulatory Visit: Payer: Self-pay

## 2021-04-23 ENCOUNTER — Ambulatory Visit (HOSPITAL_COMMUNITY): Payer: BC Managed Care – PPO

## 2021-04-23 ENCOUNTER — Encounter (HOSPITAL_BASED_OUTPATIENT_CLINIC_OR_DEPARTMENT_OTHER)
Admission: RE | Disposition: A | Discharge status: Home / Self Care | Payer: Self-pay | Source: Home / Self Care | Attending: Obstetrics and Gynecology

## 2021-04-23 ENCOUNTER — Ambulatory Visit (HOSPITAL_BASED_OUTPATIENT_CLINIC_OR_DEPARTMENT_OTHER): Payer: BC Managed Care – PPO | Admitting: Anesthesiology

## 2021-04-23 ENCOUNTER — Ambulatory Visit (HOSPITAL_BASED_OUTPATIENT_CLINIC_OR_DEPARTMENT_OTHER)
Admission: RE | Admit: 2021-04-23 | Discharge: 2021-04-23 | Disposition: A | Discharge status: Home / Self Care | Payer: BC Managed Care – PPO | Attending: Obstetrics and Gynecology | Admitting: Obstetrics and Gynecology

## 2021-04-23 ENCOUNTER — Encounter (HOSPITAL_BASED_OUTPATIENT_CLINIC_OR_DEPARTMENT_OTHER): Payer: Self-pay | Admitting: Obstetrics and Gynecology

## 2021-04-23 DIAGNOSIS — I10 Essential (primary) hypertension: Secondary | ICD-10-CM | POA: Insufficient documentation

## 2021-04-23 DIAGNOSIS — Z3A11 11 weeks gestation of pregnancy: Secondary | ICD-10-CM | POA: Diagnosis not present

## 2021-04-23 DIAGNOSIS — Z3A08 8 weeks gestation of pregnancy: Secondary | ICD-10-CM | POA: Diagnosis not present

## 2021-04-23 DIAGNOSIS — O021 Missed abortion: Secondary | ICD-10-CM | POA: Diagnosis not present

## 2021-04-23 DIAGNOSIS — E039 Hypothyroidism, unspecified: Secondary | ICD-10-CM | POA: Diagnosis not present

## 2021-04-23 DIAGNOSIS — Z01818 Encounter for other preprocedural examination: Secondary | ICD-10-CM

## 2021-04-23 HISTORY — PX: DILATION AND EVACUATION: SHX1459

## 2021-04-23 HISTORY — DX: Gestational (pregnancy-induced) hypertension without significant proteinuria, unspecified trimester: O13.9

## 2021-04-23 LAB — CBC
HCT: 40 % (ref 36.0–46.0)
Hemoglobin: 13.8 g/dL (ref 12.0–15.0)
MCH: 30.6 pg (ref 26.0–34.0)
MCHC: 34.5 g/dL (ref 30.0–36.0)
MCV: 88.7 fL (ref 80.0–100.0)
Platelets: 362 10*3/uL (ref 150–400)
RBC: 4.51 MIL/uL (ref 3.87–5.11)
RDW: 13.4 % (ref 11.5–15.5)
WBC: 12.1 10*3/uL — ABNORMAL HIGH (ref 4.0–10.5)
nRBC: 0 % (ref 0.0–0.2)

## 2021-04-23 LAB — POCT I-STAT, CHEM 8
BUN: 6 mg/dL (ref 6–20)
Calcium, Ion: 1.2 mmol/L (ref 1.15–1.40)
Chloride: 105 mmol/L (ref 98–111)
Creatinine, Ser: 0.5 mg/dL (ref 0.44–1.00)
Glucose, Bld: 99 mg/dL (ref 70–99)
HCT: 43 % (ref 36.0–46.0)
Hemoglobin: 14.6 g/dL (ref 12.0–15.0)
Potassium: 3.9 mmol/L (ref 3.5–5.1)
Sodium: 139 mmol/L (ref 135–145)
TCO2: 22 mmol/L (ref 22–32)

## 2021-04-23 LAB — TYPE AND SCREEN
ABO/RH(D): O POS
Antibody Screen: NEGATIVE

## 2021-04-23 SURGERY — DILATION AND EVACUATION, UTERUS
Anesthesia: General | Site: Vagina

## 2021-04-23 MED ORDER — 0.9 % SODIUM CHLORIDE (POUR BTL) OPTIME
TOPICAL | Status: DC | PRN
  Administered 2021-04-23: 500 mL

## 2021-04-23 MED ORDER — CEFAZOLIN SODIUM-DEXTROSE 2-4 GM/100ML-% IV SOLN
2.0000 g | INTRAVENOUS | Status: AC
  Administered 2021-04-23: 2 g via INTRAVENOUS

## 2021-04-23 MED ORDER — PROPOFOL 10 MG/ML IV BOLUS
INTRAVENOUS | Status: AC
  Filled 2021-04-23: qty 20

## 2021-04-23 MED ORDER — LIDOCAINE 2% (20 MG/ML) 5 ML SYRINGE
INTRAMUSCULAR | Status: DC | PRN
  Administered 2021-04-23: 100 mg via INTRAVENOUS

## 2021-04-23 MED ORDER — ONDANSETRON HCL 4 MG/2ML IJ SOLN
INTRAMUSCULAR | Status: AC
  Filled 2021-04-23: qty 2

## 2021-04-23 MED ORDER — LACTATED RINGERS IV SOLN: INTRAVENOUS | Status: DC

## 2021-04-23 MED ORDER — ONDANSETRON HCL 4 MG/2ML IJ SOLN: 4.0000 mg | Freq: Once | INTRAMUSCULAR | Status: DC | PRN

## 2021-04-23 MED ORDER — METHYLERGONOVINE MALEATE 0.2 MG/ML IJ SOLN
INTRAMUSCULAR | Status: DC | PRN
  Administered 2021-04-23: .2 mg via INTRAMUSCULAR

## 2021-04-23 MED ORDER — WHITE PETROLATUM EX OINT
TOPICAL_OINTMENT | CUTANEOUS | Status: AC
  Filled 2021-04-23: qty 5

## 2021-04-23 MED ORDER — DEXAMETHASONE SODIUM PHOSPHATE 10 MG/ML IJ SOLN
INTRAMUSCULAR | Status: DC | PRN
  Administered 2021-04-23: 5 mg via INTRAVENOUS

## 2021-04-23 MED ORDER — SOD CITRATE-CITRIC ACID 500-334 MG/5ML PO SOLN: 30.0000 mL | ORAL | Status: DC

## 2021-04-23 MED ORDER — MIDAZOLAM HCL 5 MG/5ML IJ SOLN
INTRAMUSCULAR | Status: DC | PRN
  Administered 2021-04-23: 2 mg via INTRAVENOUS

## 2021-04-23 MED ORDER — CEFAZOLIN SODIUM-DEXTROSE 2-4 GM/100ML-% IV SOLN
INTRAVENOUS | Status: AC
  Filled 2021-04-23: qty 100

## 2021-04-23 MED ORDER — OXYCODONE HCL 5 MG/5ML PO SOLN: 5.0000 mg | Freq: Once | ORAL | Status: DC | PRN

## 2021-04-23 MED ORDER — FENTANYL CITRATE (PF) 100 MCG/2ML IJ SOLN
INTRAMUSCULAR | Status: AC
  Filled 2021-04-23: qty 2

## 2021-04-23 MED ORDER — PROPOFOL 10 MG/ML IV BOLUS
INTRAVENOUS | Status: DC | PRN
  Administered 2021-04-23: 250 mg via INTRAVENOUS

## 2021-04-23 MED ORDER — ONDANSETRON HCL 4 MG/2ML IJ SOLN
INTRAMUSCULAR | Status: DC | PRN
  Administered 2021-04-23: 4 mg via INTRAVENOUS

## 2021-04-23 MED ORDER — OXYCODONE HCL 5 MG PO TABS: 5.0000 mg | ORAL_TABLET | Freq: Once | ORAL | Status: DC | PRN

## 2021-04-23 MED ORDER — SODIUM CHLORIDE 0.9 % IV SOLN
100.0000 mg | Freq: Once | INTRAVENOUS | Status: DC
  Filled 2021-04-23: qty 100

## 2021-04-23 MED ORDER — LABETALOL HCL 200 MG PO TABS
200.0000 mg | ORAL_TABLET | Freq: Once | ORAL | Status: AC
  Administered 2021-04-23: 200 mg via ORAL
  Filled 2021-04-23: qty 1

## 2021-04-23 MED ORDER — LIDOCAINE HCL (PF) 2 % IJ SOLN
INTRAMUSCULAR | Status: AC
Start: 2021-04-23 — End: ?
  Filled 2021-04-23: qty 5

## 2021-04-23 MED ORDER — FENTANYL CITRATE (PF) 100 MCG/2ML IJ SOLN
INTRAMUSCULAR | Status: DC | PRN
  Administered 2021-04-23: 100 ug via INTRAVENOUS

## 2021-04-23 MED ORDER — MIDAZOLAM HCL 2 MG/2ML IJ SOLN
INTRAMUSCULAR | Status: AC
  Filled 2021-04-23: qty 2

## 2021-04-23 MED ORDER — FENTANYL CITRATE (PF) 100 MCG/2ML IJ SOLN: 25.0000 ug | INTRAMUSCULAR | Status: DC | PRN

## 2021-04-23 MED ORDER — DEXAMETHASONE SODIUM PHOSPHATE 10 MG/ML IJ SOLN
INTRAMUSCULAR | Status: AC
  Filled 2021-04-23: qty 1

## 2021-04-23 MED ORDER — POVIDONE-IODINE 10 % EX SWAB: 2.0000 "application " | Freq: Once | CUTANEOUS | Status: DC

## 2021-04-23 SURGICAL SUPPLY — 23 items
CATH ROBINSON RED A/P 16FR (CATHETERS) ×1 IMPLANT
DECANTER SPIKE VIAL GLASS SM (MISCELLANEOUS) ×1 IMPLANT
DRSG TELFA 3X8 NADH (GAUZE/BANDAGES/DRESSINGS) ×2 IMPLANT
FILTER UTR ASPR ASSEMBLY (MISCELLANEOUS) ×2 IMPLANT
GAUZE 4X4 16PLY ~~LOC~~+RFID DBL (SPONGE) ×2 IMPLANT
GLOVE SURG ENC MOIS LTX SZ7 (GLOVE) ×2 IMPLANT
GOWN STRL REUS W/TWL LRG LVL3 (GOWN DISPOSABLE) ×3 IMPLANT
HOSE CONNECTING 18IN BERKELEY (TUBING) ×2 IMPLANT
KIT BERKELEY 1ST TRI 3/8 NO TR (MISCELLANEOUS) ×1 IMPLANT
KIT BERKELEY 1ST TRIMESTER 3/8 (MISCELLANEOUS) ×3 IMPLANT
KIT TURNOVER CYSTO (KITS) ×2 IMPLANT
NS IRRIG 1000ML POUR BTL (IV SOLUTION) ×1 IMPLANT
NS IRRIG 500ML POUR BTL (IV SOLUTION) ×1 IMPLANT
PACK VAGINAL MINOR WOMEN LF (CUSTOM PROCEDURE TRAY) ×2 IMPLANT
PAD DRESSING TELFA 3X8 NADH (GAUZE/BANDAGES/DRESSINGS) ×1 IMPLANT
PAD OB MATERNITY 4.3X12.25 (PERSONAL CARE ITEMS) ×2 IMPLANT
PAD PREP 24X48 CUFFED NSTRL (MISCELLANEOUS) ×2 IMPLANT
SET BERKELEY SUCTION TUBING (SUCTIONS) ×2 IMPLANT
TOWEL OR 17X26 10 PK STRL BLUE (TOWEL DISPOSABLE) ×2 IMPLANT
VACURETTE 10 RIGID CVD (CANNULA) IMPLANT
VACURETTE 7MM CVD STRL WRAP (CANNULA) IMPLANT
VACURETTE 8 RIGID CVD (CANNULA) IMPLANT
VACURETTE 9 RIGID CVD (CANNULA) ×1 IMPLANT

## 2021-04-23 NOTE — Transfer of Care (Signed)
Immediate Anesthesia Transfer of Care Note ? ?Patient: Catherine Schroeder ? ?Procedure(s) Performed: SUCTION DILATATION AND EVACUATION (Vagina ) ? ?Patient Location: PACU ? ?Anesthesia Type:General ? ?Level of Consciousness: drowsy ? ?Airway & Oxygen Therapy: Patient Spontanous Breathing ? ?Post-op Assessment: Report given to RN and Post -op Vital signs reviewed and stable ? ?Post vital signs: Reviewed and stable ? ?Last Vitals:  ?Vitals Value Taken Time  ?BP 128/89 04/23/21 1236  ?Temp    ?Pulse 87 04/23/21 1242  ?Resp 23 04/23/21 1242  ?SpO2 100 % 04/23/21 1242  ?Vitals shown include unvalidated device data. ? ?Last Pain:  ?Vitals:  ? 04/23/21 1106  ?TempSrc: Oral  ?   ? ?  ? ?Complications: No notable events documented. ?

## 2021-04-23 NOTE — Anesthesia Procedure Notes (Signed)
Procedure Name: LMA Insertion ?Date/Time: 04/23/2021 12:10 PM ?Performed by: Bishop Limbo, CRNA ?Pre-anesthesia Checklist: Patient identified, Emergency Drugs available, Suction available and Patient being monitored ?Patient Re-evaluated:Patient Re-evaluated prior to induction ?Oxygen Delivery Method: Circle System Utilized ?Preoxygenation: Pre-oxygenation with 100% oxygen ?Induction Type: IV induction ?Ventilation: Mask ventilation without difficulty ?LMA: LMA inserted ?LMA Size: 4.0 ?Number of attempts: 1 ?Placement Confirmation: positive ETCO2 ?Tube secured with: Tape ?Dental Injury: Teeth and Oropharynx as per pre-operative assessment  ? ? ? ? ?

## 2021-04-23 NOTE — Anesthesia Postprocedure Evaluation (Signed)
Anesthesia Post Note ? ?Patient: Catherine Schroeder ? ?Procedure(s) Performed: SUCTION DILATATION AND EVACUATION AND CHROMOSME STUDIES (Vagina ) ? ?  ? ?Patient location during evaluation: Phase II ?Anesthesia Type: General ?Level of consciousness: awake and alert ?Pain management: pain level controlled ?Vital Signs Assessment: post-procedure vital signs reviewed and stable ?Respiratory status: spontaneous breathing, nonlabored ventilation and respiratory function stable ?Cardiovascular status: blood pressure returned to baseline and stable ?Postop Assessment: no apparent nausea or vomiting ?Anesthetic complications: no ? ? ?No notable events documented. ? ?Last Vitals:  ?Vitals:  ? 04/23/21 1300 04/23/21 1305  ?BP: 128/86   ?Pulse: 80 79  ?Resp: (!) 22 (!) 24  ?Temp:    ?SpO2: 98% 98%  ?  ?Last Pain:  ?Vitals:  ? 04/23/21 1305  ?TempSrc:   ?PainSc: 0-No pain  ? ? ?  ?  ?  ?  ?  ?  ? ?Shanicqua Coldren A. ? ? ? ? ?

## 2021-04-23 NOTE — Progress Notes (Signed)
There has been no change in the patients history, status or exam since the history and physical. ? ?Vitals:  ? 04/22/21 1056 04/23/21 1106  ?BP:  (!) 142/99  ?Pulse:  89  ?Resp:  17  ?Temp:  98.6 ?F (37 ?C)  ?TempSrc:  Oral  ?SpO2:  97%  ?Weight: 103.9 kg 103.8 kg  ?Height: 5\' 4"  (1.626 m) 5\' 4"  (1.626 m)  ? ? ?Results for orders placed or performed during the hospital encounter of 04/23/21 (from the past 72 hour(s))  ?CBC     Status: Abnormal  ? Collection Time: 04/23/21 10:56 AM  ?Result Value Ref Range  ? WBC 12.1 (H) 4.0 - 10.5 K/uL  ? RBC 4.51 3.87 - 5.11 MIL/uL  ? Hemoglobin 13.8 12.0 - 15.0 g/dL  ? HCT 40.0 36.0 - 46.0 %  ? MCV 88.7 80.0 - 100.0 fL  ? MCH 30.6 26.0 - 34.0 pg  ? MCHC 34.5 30.0 - 36.0 g/dL  ? RDW 13.4 11.5 - 15.5 %  ? Platelets 362 150 - 400 K/uL  ? nRBC 0.0 0.0 - 0.2 %  ?  Comment: Performed at [facility], [address] [place], [place]  ?I-STAT, chem 8     Status: None  ? Collection Time: 04/23/21 11:02 AM  ?Result Value Ref Range  ? Sodium 139 135 - 145 mmol/L  ? Potassium 3.9 3.5 - 5.1 mmol/L  ? Chloride 105 98 - 111 mmol/L  ? BUN 6 6 - 20 mg/dL  ? Creatinine, Ser 0.50 0.44 - 1.00 mg/dL  ? Glucose, Bld 99 70 - 99 mg/dL  ?  Comment: Glucose reference range applies only to samples taken after fasting for at least 8 hours.  ? Calcium, Ion 1.20 1.15 - 1.40 mmol/L  ? TCO2 22 22 - 32 mmol/L  ? Hemoglobin 14.6 12.0 - 15.0 g/dL  ? HCT 43.0 36.0 - 46.0 %  ? ?73220 repeated for pt- no FHTs, measuring 8 3/7.   ?[place]  ?

## 2021-04-23 NOTE — Op Note (Signed)
04/23/2021 ? ?12:28 PM ? ?PATIENT:  Catherine Schroeder  36 y.o. female ? ?PRE-OPERATIVE DIAGNOSIS:  missed ab ? ?POST-OPERATIVE DIAGNOSIS:  * No post-op diagnosis entered * ? ?PROCEDURE:  Procedure(s) with comments: ?SUCTION DILATATION AND EVACUATION (N/A) - urgent suction d&c ? ?SURGEON:  Surgeon(s) and Role: ?   Carrington Clamp, MD - Primary ? ?ANESTHESIA:   general ? ?EBL: 50 cc ? ? ?LOCAL MEDICATIONS USED:  NONE ? ?SPECIMEN:  Source of Specimen:  Uterine contents ? ?DISPOSITION OF SPECIMEN:  PATHOLOGY and Anora for chromosomes ? ?COUNTS:  YES ? ?TOURNIQUET:  * No tourniquets in log * ? ?DICTATION: .Note written in EPIC ? ?PLAN OF CARE: Discharge to home after PACU ? ?PATIENT DISPOSITION:  PACU - hemodynamically stable. ?  ?Delay start of Pharmacological VTE agent (>24hrs) due to surgical blood loss or risk of bleeding: not applicable ? ?Medications: Methergine ? ?Complications: None ? ?Findings:  10 week size uterus to 8 size post procedure.  Good crie was achieved. ? ?After adequate anesthesia was achieved, the patient was prepped and draped in the usual sterile fashion.  The speculum was placed in the vagina and the cervix stabilized with a single-tooth tenaculum.  The cervix was dilated with Shawnie Pons dilators and the 9 mm curette was used to remove contents of the uterus.  Alternating sharp curettage with a curette and suction curettage was performed until all contents were removed and good crie was achieved.  All instruments were removed from the vagina.  The patient tolerated the procedure well.   ? ?Shemiah Rosch A ?  ?  ?

## 2021-04-23 NOTE — Discharge Instructions (Signed)

## 2021-04-23 NOTE — Brief Op Note (Signed)
04/23/2021 ? ?12:28 PM ? ?PATIENT:  Catherine Schroeder  36 y.o. female ? ?PRE-OPERATIVE DIAGNOSIS:  missed ab ? ?POST-OPERATIVE DIAGNOSIS:  * No post-op diagnosis entered * ? ?PROCEDURE:  Procedure(s) with comments: ?SUCTION DILATATION AND EVACUATION (N/A) - urgent suction d&c ? ?SURGEON:  Surgeon(s) and Role: ?   Carrington Clamp, MD - Primary ? ?ANESTHESIA:   general ? ?EBL: 50 cc ? ? ?LOCAL MEDICATIONS USED:  NONE ? ?SPECIMEN:  Source of Specimen:  Uterine contents ? ?DISPOSITION OF SPECIMEN:  PATHOLOGY ? ?COUNTS:  YES ? ?TOURNIQUET:  * No tourniquets in log * ? ?DICTATION: .Note written in EPIC ? ?PLAN OF CARE: Discharge to home after PACU ? ?PATIENT DISPOSITION:  PACU - hemodynamically stable. ?  ?Delay start of Pharmacological VTE agent (>24hrs) due to surgical blood loss or risk of bleeding: not applicable ? ?

## 2021-04-24 LAB — SURGICAL PATHOLOGY

## 2021-04-27 ENCOUNTER — Encounter (HOSPITAL_BASED_OUTPATIENT_CLINIC_OR_DEPARTMENT_OTHER): Payer: Self-pay | Admitting: Obstetrics and Gynecology

## 2021-04-28 DIAGNOSIS — O034 Incomplete spontaneous abortion without complication: Secondary | ICD-10-CM | POA: Diagnosis not present

## 2021-05-05 ENCOUNTER — Other Ambulatory Visit: Payer: Self-pay

## 2021-05-05 ENCOUNTER — Other Ambulatory Visit: Payer: Self-pay | Admitting: Obstetrics and Gynecology

## 2021-05-05 DIAGNOSIS — O034 Incomplete spontaneous abortion without complication: Secondary | ICD-10-CM | POA: Diagnosis not present

## 2021-05-05 NOTE — Progress Notes (Signed)
Catherine Schroeder denies chest pain or shortness of breath. Catherine Schroeder denies having any s/s of Covid in her household.  Catherine Schroeder denies any known exposure to Covid.  ? ?Catherine Schroeder has gestational diabetes, Catherine Schroeder no longer checks CBGs. ? ?PCP is DR Tacey Heap. ? ?I instructed Catherine Schroeder Catherine Schroeder to shower with antibiotic soap, dry off with a clean towel. Do ?not shave. No nail polish, artificial or acrylic nails. Wear clean clothes, brush your teeth. ?Glasses, contact lens,dentures or partials may not be worn in the OR. If you need to wear them, please bring a case for glasses, do not wear contacts or bring a case, the hospital does not have contact cases, dentures or partials will have to be removed , make sure they are clean, we will provide a denture cup to put them in. You will need some one to drive you home and a responsible person over the age of 3 to stay with you for the first 24 hours after surgery.  ?

## 2021-05-05 NOTE — Progress Notes (Signed)
Interval H&P ? ?Pt presented for D&E on 4-5 and underwent procedure.   Unfortunately, no POC was seen on path.  She then took cytotec twice and passed the majority of the tissue but [place] yesterday showed a 1.5 cm stripe with continued blood flow.  Pt  counseled again about the risks and benefits with proceeding with another D&E vs another cytotec and desires to proceed with surgery. ? ?There has been no other change in patient's history since the last H&P except for above.  ? ? ?

## 2021-05-06 ENCOUNTER — Other Ambulatory Visit: Payer: Self-pay

## 2021-05-06 ENCOUNTER — Ambulatory Visit (HOSPITAL_COMMUNITY): Payer: BC Managed Care – PPO | Admitting: Anesthesiology

## 2021-05-06 ENCOUNTER — Encounter (HOSPITAL_COMMUNITY)
Admission: RE | Disposition: A | Discharge status: Home / Self Care | Payer: Self-pay | Source: Home / Self Care | Attending: Obstetrics and Gynecology

## 2021-05-06 ENCOUNTER — Encounter (HOSPITAL_COMMUNITY): Payer: Self-pay | Admitting: Obstetrics and Gynecology

## 2021-05-06 ENCOUNTER — Ambulatory Visit (HOSPITAL_COMMUNITY)
Admission: RE | Admit: 2021-05-06 | Discharge: 2021-05-06 | Disposition: A | Discharge status: Home / Self Care | Payer: BC Managed Care – PPO | Attending: Obstetrics and Gynecology | Admitting: Obstetrics and Gynecology

## 2021-05-06 DIAGNOSIS — Z8759 Personal history of other complications of pregnancy, childbirth and the puerperium: Secondary | ICD-10-CM | POA: Diagnosis not present

## 2021-05-06 DIAGNOSIS — Z8632 Personal history of gestational diabetes: Secondary | ICD-10-CM | POA: Diagnosis not present

## 2021-05-06 DIAGNOSIS — E669 Obesity, unspecified: Secondary | ICD-10-CM | POA: Diagnosis not present

## 2021-05-06 DIAGNOSIS — O021 Missed abortion: Secondary | ICD-10-CM | POA: Diagnosis not present

## 2021-05-06 DIAGNOSIS — E039 Hypothyroidism, unspecified: Secondary | ICD-10-CM | POA: Insufficient documentation

## 2021-05-06 DIAGNOSIS — Z6839 Body mass index (BMI) 39.0-39.9, adult: Secondary | ICD-10-CM | POA: Insufficient documentation

## 2021-05-06 HISTORY — PX: DILATION AND EVACUATION: SHX1459

## 2021-05-06 LAB — BASIC METABOLIC PANEL
Anion gap: 9 (ref 5–15)
BUN: 7 mg/dL (ref 6–20)
CO2: 24 mmol/L (ref 22–32)
Calcium: 9.2 mg/dL (ref 8.9–10.3)
Chloride: 106 mmol/L (ref 98–111)
Creatinine, Ser: 0.69 mg/dL (ref 0.44–1.00)
GFR, Estimated: >60 mL/min (ref >60)
Glucose, Bld: 102 mg/dL — ABNORMAL HIGH (ref 70–99)
Potassium: 3.4 mmol/L — ABNORMAL LOW (ref 3.5–5.1)
Sodium: 139 mmol/L (ref 135–145)

## 2021-05-06 LAB — CBC
HCT: 36.1 % (ref 36.0–46.0)
Hemoglobin: 12.4 g/dL (ref 12.0–15.0)
MCH: 30.8 pg (ref 26.0–34.0)
MCHC: 34.3 g/dL (ref 30.0–36.0)
MCV: 89.8 fL (ref 80.0–100.0)
Platelets: 315 10*3/uL (ref 150–400)
RBC: 4.02 MIL/uL (ref 3.87–5.11)
RDW: 14.5 % (ref 11.5–15.5)
WBC: 8.8 10*3/uL (ref 4.0–10.5)
nRBC: 0 % (ref 0.0–0.2)

## 2021-05-06 LAB — GLUCOSE, CAPILLARY: Glucose-Capillary: 100 mg/dL — ABNORMAL HIGH (ref 70–99)

## 2021-05-06 SURGERY — DILATION AND EVACUATION, UTERUS
Anesthesia: General

## 2021-05-06 MED ORDER — PROPOFOL 10 MG/ML IV BOLUS
INTRAVENOUS | Status: AC
  Filled 2021-05-06: qty 20

## 2021-05-06 MED ORDER — MIDAZOLAM HCL 5 MG/5ML IJ SOLN
INTRAMUSCULAR | Status: DC | PRN
Start: 2021-05-06 — End: 2021-05-06
  Administered 2021-05-06: 2 mg via INTRAVENOUS

## 2021-05-06 MED ORDER — ORAL CARE MOUTH RINSE: 15.0000 mL | Freq: Once | OROMUCOSAL | Status: AC

## 2021-05-06 MED ORDER — MIDAZOLAM HCL 2 MG/2ML IJ SOLN
INTRAMUSCULAR | Status: AC
  Filled 2021-05-06: qty 2

## 2021-05-06 MED ORDER — POVIDONE-IODINE 10 % EX SWAB
2.0000 | Freq: Once | CUTANEOUS | Status: DC
Start: 2021-05-06 — End: 2021-05-06

## 2021-05-06 MED ORDER — LACTATED RINGERS IV SOLN: INTRAVENOUS | Status: DC

## 2021-05-06 MED ORDER — ACETAMINOPHEN 10 MG/ML IV SOLN
INTRAVENOUS | Status: DC | PRN
  Administered 2021-05-06: 1000 mg via INTRAVENOUS

## 2021-05-06 MED ORDER — OXYCODONE HCL 5 MG/5ML PO SOLN: 5.0000 mg | Freq: Once | ORAL | Status: DC | PRN

## 2021-05-06 MED ORDER — SOD CITRATE-CITRIC ACID 500-334 MG/5ML PO SOLN: 30.0000 mL | ORAL | Status: DC

## 2021-05-06 MED ORDER — AMISULPRIDE (ANTIEMETIC) 5 MG/2ML IV SOLN: 10.0000 mg | Freq: Once | INTRAVENOUS | Status: DC | PRN

## 2021-05-06 MED ORDER — CHLORHEXIDINE GLUCONATE 0.12 % MT SOLN
15.0000 mL | Freq: Once | OROMUCOSAL | Status: AC
  Administered 2021-05-06: 15 mL via OROMUCOSAL
  Filled 2021-05-06: qty 15

## 2021-05-06 MED ORDER — METHYLERGONOVINE MALEATE 0.2 MG/ML IJ SOLN
INTRAMUSCULAR | Status: AC
  Filled 2021-05-06: qty 1

## 2021-05-06 MED ORDER — OXYCODONE HCL 5 MG PO TABS: 5.0000 mg | ORAL_TABLET | Freq: Once | ORAL | Status: DC | PRN

## 2021-05-06 MED ORDER — FENTANYL CITRATE (PF) 250 MCG/5ML IJ SOLN
INTRAMUSCULAR | Status: AC
  Filled 2021-05-06: qty 5

## 2021-05-06 MED ORDER — METHYLERGONOVINE MALEATE 0.2 MG/ML IJ SOLN
INTRAMUSCULAR | Status: DC | PRN
  Administered 2021-05-06: .2 mg via INTRAMUSCULAR

## 2021-05-06 MED ORDER — HYDROMORPHONE HCL 1 MG/ML IJ SOLN: 0.2500 mg | INTRAMUSCULAR | Status: DC | PRN

## 2021-05-06 MED ORDER — KETOROLAC TROMETHAMINE 30 MG/ML IJ SOLN
INTRAMUSCULAR | Status: DC | PRN
  Administered 2021-05-06: 30 mg via INTRAVENOUS

## 2021-05-06 MED ORDER — ACETAMINOPHEN 10 MG/ML IV SOLN
INTRAVENOUS | Status: AC
  Filled 2021-05-06: qty 100

## 2021-05-06 MED ORDER — FENTANYL CITRATE (PF) 250 MCG/5ML IJ SOLN
INTRAMUSCULAR | Status: AC
Start: 2021-05-06 — End: ?
  Filled 2021-05-06: qty 5

## 2021-05-06 MED ORDER — FENTANYL CITRATE (PF) 100 MCG/2ML IJ SOLN
INTRAMUSCULAR | Status: DC | PRN
  Administered 2021-05-06: 50 ug via INTRAVENOUS

## 2021-05-06 MED ORDER — PROPOFOL 10 MG/ML IV BOLUS
INTRAVENOUS | Status: DC | PRN
  Administered 2021-05-06: 200 mg via INTRAVENOUS

## 2021-05-06 MED ORDER — LIDOCAINE 2% (20 MG/ML) 5 ML SYRINGE
INTRAMUSCULAR | Status: DC | PRN
  Administered 2021-05-06: 100 mg via INTRAVENOUS

## 2021-05-06 MED ORDER — SODIUM CHLORIDE 0.9 % IV SOLN
100.0000 mg | Freq: Two times a day (BID) | INTRAVENOUS | Status: DC
  Filled 2021-05-06: qty 100

## 2021-05-06 SURGICAL SUPPLY — 21 items
CATH ROBINSON RED A/P 16FR (CATHETERS) ×2 IMPLANT
CNTNR URN SCR LID CUP LEK RST (MISCELLANEOUS) ×1 IMPLANT
CONT SPEC 4OZ STRL OR WHT (MISCELLANEOUS) ×1
FILTER UTR ASPR ASSEMBLY (MISCELLANEOUS) ×1 IMPLANT
GLOVE BIO SURGEON STRL SZ7 (GLOVE) ×2 IMPLANT
GLOVE BIOGEL PI IND STRL 7.0 (GLOVE) ×1 IMPLANT
GLOVE BIOGEL PI INDICATOR 7.0 (GLOVE) ×1
GOWN STRL REUS W/ TWL LRG LVL3 (GOWN DISPOSABLE) ×2 IMPLANT
GOWN STRL REUS W/TWL LRG LVL3 (GOWN DISPOSABLE) ×2
HOSE CONNECTING 18IN BERKELEY (TUBING) ×1 IMPLANT
KIT BERKELEY 1ST TRI 3/8 NO TR (MISCELLANEOUS) ×1 IMPLANT
KIT BERKELEY 1ST TRIMESTER 3/8 (MISCELLANEOUS) ×1 IMPLANT
KIT TURNOVER KIT B (KITS) ×2 IMPLANT
PACK VAGINAL MINOR WOMEN LF (CUSTOM PROCEDURE TRAY) ×2 IMPLANT
PAD OB MATERNITY 4.3X12.25 (PERSONAL CARE ITEMS) ×2 IMPLANT
TOWEL GREEN STERILE FF (TOWEL DISPOSABLE) ×3 IMPLANT
UNDERPAD 30X36 HEAVY ABSORB (UNDERPADS AND DIAPERS) ×2 IMPLANT
VACURETTE 10 RIGID CVD (CANNULA) IMPLANT
VACURETTE 7MM CVD STRL WRAP (CANNULA) IMPLANT
VACURETTE 8 RIGID CVD (CANNULA) IMPLANT
VACURETTE 9 RIGID CVD (CANNULA) IMPLANT

## 2021-05-06 NOTE — Anesthesia Preprocedure Evaluation (Addendum)
Anesthesia Evaluation  ?Patient identified by MRN, date of birth, ID band ?Patient awake ? ? ? ?Reviewed: ?Allergy & Precautions, NPO status , Patient's Chart, lab work & pertinent test results, reviewed documented beta blocker date and time  ? ?Airway ?Mallampati: II ? ?TM Distance: <3 FB ?Neck ROM: Full ? ? ? Dental ?no notable dental hx. ?(+) Teeth Intact, Dental Advisory Given ?  ?Pulmonary ?neg pulmonary ROS,  ?  ?Pulmonary exam normal ?breath sounds clear to auscultation ? ? ? ? ? ? Cardiovascular ?hypertension, Pt. on medications and Pt. on home beta blockers ?Normal cardiovascular exam ?Rhythm:Regular Rate:Normal ? ?Hx/o PIH ?  ?Neuro/Psych ? Headaches, negative psych ROS  ? GI/Hepatic ?GERD  ,GERD with pregnancy ?  ?Endo/Other  ?diabetes, GestationalHypothyroidism Obesity ? Renal/GU ?  ?negative genitourinary ?  ?Musculoskeletal ?negative musculoskeletal ROS ?(+)  ? Abdominal ?(+) + obese,   ?Peds ? Hematology ?negative hematology ROS ?(+)   ?Anesthesia Other Findings ? ? Reproductive/Obstetrics ?(+) Pregnancy ?Missed Ab ? ?  ? ? ? ? ? ? ? ? ? ? ? ? ? ?  ?  ? ? ? ? ? ? ? ?Anesthesia Physical ? ?Anesthesia Plan ? ?ASA: 2 ? ?Anesthesia Plan: General  ? ?Post-op Pain Management: Toradol IV (intra-op)* and Ofirmev IV (intra-op)*  ? ?Induction: Intravenous ? ?PONV Risk Score and Plan: 4 or greater and Treatment may vary due to age or medical condition, Ondansetron, Dexamethasone and Midazolam ? ?Airway Management Planned: LMA ? ?Additional Equipment: None ? ?Intra-op Plan:  ? ?Post-operative Plan: Extubation in OR ? ?Informed Consent: I have reviewed the patients History and Physical, chart, labs and discussed the procedure including the risks, benefits and alternatives for the proposed anesthesia with the patient or authorized representative who has indicated his/her understanding and acceptance.  ? ? ? ?Dental advisory given ? ?Plan Discussed with: CRNA ? ?Anesthesia Plan  Comments:   ? ? ? ? ?Anesthesia Quick Evaluation ? ?

## 2021-05-06 NOTE — Transfer of Care (Signed)
Immediate Anesthesia Transfer of Care Note ? ?Patient: Catherine Schroeder ? ?Procedure(s) Performed: SUCTION DILATATION AND EVACUATION ? ?Patient Location: PACU ? ?Anesthesia Type:General ? ?Level of Consciousness: drowsy and patient cooperative ? ?Airway & Oxygen Therapy: Patient Spontanous Breathing ? ?Post-op Assessment: Report given to RN and Post -op Vital signs reviewed and stable ? ?Post vital signs: Reviewed and stable ? ?Last Vitals:  ?Vitals Value Taken Time  ?BP 137/87 05/06/21 1521  ?Temp    ?Pulse 83 05/06/21 1525  ?Resp 23 05/06/21 1525  ?SpO2 98 % 05/06/21 1525  ?Vitals shown include unvalidated device data. ? ?Last Pain:  ?Vitals:  ? 05/06/21 1341  ?TempSrc:   ?PainSc: 0-No pain  ?   ? ?  ? ?Complications: No notable events documented. ?

## 2021-05-06 NOTE — Op Note (Signed)
05/06/2021 ? ?3:07 PM ? ?PATIENT:  Catherine Schroeder  36 y.o. female ? ?PRE-OPERATIVE DIAGNOSIS:  missed ab ? ?POST-OPERATIVE DIAGNOSIS:  missed ab ? ?PROCEDURE:  Procedure(s) with comments: ?SUCTION DILATATION AND EVACUATION (N/Schroeder) - ANORA KIT NOT NEEDED ? ?SURGEON:  Surgeon(s) and Role: ?   Bobbye Charleston, MD - Primary ? ?ANESTHESIA:   general ? ?EBL:  <50cc ? ?LOCAL MEDICATIONS USED:  NONE ? ?SPECIMEN:  Source of Specimen:  uterine curretings ? ?DISPOSITION OF SPECIMEN:  PATHOLOGY ? ?COUNTS:  YES ? ?TOURNIQUET:  * No tourniquets in log * ? ?DICTATION: .Note written in EPIC ? ?PLAN OF CARE: Discharge to home after PACU ? ?PATIENT DISPOSITION:  PACU - hemodynamically stable. ?  ?Delay start of Pharmacological VTE agent (>24hrs) due to surgical blood loss or risk of bleeding: not applicable ? ?Medications: Methergine ? ?Complications: None ? ?Findings:  9 week size uterus to 7 size post procedure.  Good crie was achieved. ? ?Reason for operation:  Pt was taken to OR for D&E for MAB on 04-22-21.  Unfortunately, despite good attempts with suction and tissue seen, no actual POC were obtained.  Pt took two doses of cytotec and passed the majority of the pregnancy at home.  [place] yesterday however showed there was likely still POC.  Pt consented again for D&E. ? ?Technique: ? ?After adequate anesthesia was achieved, the patient was prepped and draped in the usual sterile fashion.  The speculum was placed in the vagina and the cervix stabilized with Schroeder single-tooth tenaculum.  The cervix was dilated with Kennon Rounds dilators and the 9 mm curette was used to remove contents of the uterus.  Alternating sharp curettage with Schroeder curette and suction curettage was performed until all contents were removed and good crie was achieved.  All instruments were removed from the vagina.  The patient tolerated the procedure well.   ? ?Catherine Schroeder ?  ?  ?

## 2021-05-06 NOTE — Brief Op Note (Signed)
05/06/2021 ? ?3:07 PM ? ?PATIENT:  Catherine Schroeder  36 y.o. female ? ?PRE-OPERATIVE DIAGNOSIS:  missed ab ? ?POST-OPERATIVE DIAGNOSIS:  missed ab ? ?PROCEDURE:  Procedure(s) with comments: ?SUCTION DILATATION AND EVACUATION (N/A) - ANORA KIT NOT NEEDED ? ?SURGEON:  Surgeon(s) and Role: ?   * Horvath, Michelle, MD - Primary ? ?ANESTHESIA:   general ? ?EBL:  <50cc ? ?LOCAL MEDICATIONS USED:  NONE ? ?SPECIMEN:  Source of Specimen:  uterine curretings ? ?DISPOSITION OF SPECIMEN:  PATHOLOGY ? ?COUNTS:  YES ? ?TOURNIQUET:  * No tourniquets in log * ? ?DICTATION: .Note written in EPIC ? ?PLAN OF CARE: Discharge to home after PACU ? ?PATIENT DISPOSITION:  PACU - hemodynamically stable. ?  ?Delay start of Pharmacological VTE agent (>24hrs) due to surgical blood loss or risk of bleeding: not applicable ? ?

## 2021-05-06 NOTE — Anesthesia Procedure Notes (Signed)
Procedure Name: LMA Insertion ?Date/Time: 05/06/2021 2:50 PM ?Performed by: Lynnell Chad, CRNA ?Pre-anesthesia Checklist: Patient identified, Emergency Drugs available, Suction available and Patient being monitored ?Patient Re-evaluated:Patient Re-evaluated prior to induction ?Oxygen Delivery Method: Circle System Utilized ?Preoxygenation: Pre-oxygenation with 100% oxygen ?Induction Type: IV induction ?Ventilation: Mask ventilation without difficulty ?LMA: LMA inserted ?LMA Size: 4.0 ?Number of attempts: 1 ?Airway Equipment and Method: Bite block ?Placement Confirmation: positive ETCO2 ?Tube secured with: Tape ?Dental Injury: Teeth and Oropharynx as per pre-operative assessment  ? ? ? ? ?

## 2021-05-06 NOTE — Progress Notes (Signed)
There has been no change in the patients history, status or exam since the history and physical. ? ?Vitals:  ? 05/06/21 1317  ?BP: (!) 140/93  ?Pulse: 77  ?Resp: 17  ?Temp: 98.9 ?F (37.2 ?C)  ?TempSrc: Oral  ?SpO2: 98%  ?Weight: 104.8 kg  ?Height: 5\' 4"  (1.626 m)  ? ? ?No results found for this or any previous visit (from the past 72 hour(s)). ? ?Catherine Schroeder  ?

## 2021-05-07 ENCOUNTER — Encounter (HOSPITAL_COMMUNITY): Payer: Self-pay | Admitting: Obstetrics and Gynecology

## 2021-05-07 LAB — SURGICAL PATHOLOGY

## 2021-05-08 NOTE — Anesthesia Postprocedure Evaluation (Signed)
Anesthesia Post Note ? ?Patient: CHRISTINEA BRIZUELA ? ?Procedure(s) Performed: SUCTION DILATATION AND EVACUATION ? ?  ? ?Patient location during evaluation: PACU ?Anesthesia Type: General ?Level of consciousness: sedated and patient cooperative ?Pain management: pain level controlled ?Vital Signs Assessment: post-procedure vital signs reviewed and stable ?Respiratory status: spontaneous breathing ?Cardiovascular status: stable ?Anesthetic complications: no ? ? ?No notable events documented. ? ?Last Vitals:  ?Vitals:  ? 05/06/21 1535 05/06/21 1550  ?BP: (!) 141/88 136/77  ?Pulse: 80 73  ?Resp: (!) 22 (!) 23  ?Temp:  37.1 ?C  ?SpO2: 98% 99%  ?  ?Last Pain:  ?Vitals:  ? 05/06/21 1550  ?TempSrc:   ?PainSc: 0-No pain  ? ? ?  ?  ?  ?  ?  ?  ? ?Lewie Loron ? ? ? ? ?

## 2021-05-26 NOTE — H&P (Signed)
Interval H&P ? ?See initial H&P from 4-6. ?No POC was obtained from path that procedure.  Cytotec in meantime x2 allowed pt to pass most of products but still POC on [place] yesterday.  For repeat d&e today.  No other changes in H&P. ? ?Bobbye Charleston ?

## 2021-06-27 DIAGNOSIS — J029 Acute pharyngitis, unspecified: Secondary | ICD-10-CM | POA: Diagnosis not present

## 2021-08-17 DIAGNOSIS — N925 Other specified irregular menstruation: Secondary | ICD-10-CM | POA: Diagnosis not present

## 2021-09-22 DIAGNOSIS — Z01419 Encounter for gynecological examination (general) (routine) without abnormal findings: Secondary | ICD-10-CM | POA: Diagnosis not present

## 2021-09-22 DIAGNOSIS — Z3202 Encounter for pregnancy test, result negative: Secondary | ICD-10-CM | POA: Diagnosis not present

## 2021-09-22 DIAGNOSIS — Z6839 Body mass index (BMI) 39.0-39.9, adult: Secondary | ICD-10-CM | POA: Diagnosis not present

## 2021-09-22 DIAGNOSIS — E039 Hypothyroidism, unspecified: Secondary | ICD-10-CM | POA: Diagnosis not present

## 2021-11-11 DIAGNOSIS — E039 Hypothyroidism, unspecified: Secondary | ICD-10-CM | POA: Diagnosis not present

## 2021-11-11 DIAGNOSIS — Z6839 Body mass index (BMI) 39.0-39.9, adult: Secondary | ICD-10-CM | POA: Diagnosis not present

## 2021-12-02 DIAGNOSIS — N970 Female infertility associated with anovulation: Secondary | ICD-10-CM | POA: Diagnosis not present

## 2021-12-02 DIAGNOSIS — I10 Essential (primary) hypertension: Secondary | ICD-10-CM | POA: Diagnosis not present

## 2022-01-14 DIAGNOSIS — R1030 Lower abdominal pain, unspecified: Secondary | ICD-10-CM | POA: Diagnosis not present

## 2022-01-14 DIAGNOSIS — Z6841 Body Mass Index (BMI) 40.0 and over, adult: Secondary | ICD-10-CM | POA: Diagnosis not present

## 2022-01-18 NOTE — L&D Delivery Note (Signed)
Delivery Note FHT with some late decels and variables.  She progressed to complete and pushed for less than 30 minutes.  At 1:00 AM a viable female was delivered via VBAC, Spontaneous (Presentation:   vtx, LOA).  APGAR: 8, 9; weight pending.   Placenta status: Spontaneous, Intact.  Cord: 3 vessels with the following complications: None.  100cc clot came out right after head  Anesthesia: Epidural Episiotomy: None Lacerations: None Suture Repair:  none Est. Blood Loss (mL): 420  Mom to postpartum.  Baby to Couplet care / Skin to Skin.  They do want circumcision for baby, discussed procedure, questions answered.  Leighton Roach Pharrell Ledford 11/03/2022, 1:21 AM

## 2022-03-12 DIAGNOSIS — Z3201 Encounter for pregnancy test, result positive: Secondary | ICD-10-CM | POA: Diagnosis not present

## 2022-03-16 DIAGNOSIS — N925 Other specified irregular menstruation: Secondary | ICD-10-CM | POA: Diagnosis not present

## 2022-03-19 DIAGNOSIS — N925 Other specified irregular menstruation: Secondary | ICD-10-CM | POA: Diagnosis not present

## 2022-03-23 DIAGNOSIS — N925 Other specified irregular menstruation: Secondary | ICD-10-CM | POA: Diagnosis not present

## 2022-03-26 DIAGNOSIS — N925 Other specified irregular menstruation: Secondary | ICD-10-CM | POA: Diagnosis not present

## 2022-03-31 DIAGNOSIS — N925 Other specified irregular menstruation: Secondary | ICD-10-CM | POA: Diagnosis not present

## 2022-04-27 DIAGNOSIS — O09521 Supervision of elderly multigravida, first trimester: Secondary | ICD-10-CM | POA: Diagnosis not present

## 2022-04-27 DIAGNOSIS — Z3480 Encounter for supervision of other normal pregnancy, unspecified trimester: Secondary | ICD-10-CM | POA: Diagnosis not present

## 2022-04-27 DIAGNOSIS — N925 Other specified irregular menstruation: Secondary | ICD-10-CM | POA: Diagnosis not present

## 2022-04-27 DIAGNOSIS — Z113 Encounter for screening for infections with a predominantly sexual mode of transmission: Secondary | ICD-10-CM | POA: Diagnosis not present

## 2022-04-27 DIAGNOSIS — E039 Hypothyroidism, unspecified: Secondary | ICD-10-CM | POA: Diagnosis not present

## 2022-04-27 DIAGNOSIS — O139 Gestational [pregnancy-induced] hypertension without significant proteinuria, unspecified trimester: Secondary | ICD-10-CM | POA: Diagnosis not present

## 2022-04-27 LAB — OB RESULTS CONSOLE GC/CHLAMYDIA
Chlamydia: NEGATIVE
Neisseria Gonorrhea: NEGATIVE

## 2022-04-27 LAB — OB RESULTS CONSOLE HEPATITIS B SURFACE ANTIGEN: Hepatitis B Surface Ag: NEGATIVE

## 2022-04-27 LAB — HEPATITIS C ANTIBODY: HCV Ab: NEGATIVE

## 2022-04-27 LAB — OB RESULTS CONSOLE ANTIBODY SCREEN: Antibody Screen: NEGATIVE

## 2022-04-27 LAB — OB RESULTS CONSOLE HIV ANTIBODY (ROUTINE TESTING): HIV: NONREACTIVE

## 2022-04-27 LAB — OB RESULTS CONSOLE RPR: RPR: NONREACTIVE

## 2022-04-27 LAB — OB RESULTS CONSOLE RUBELLA ANTIBODY, IGM: Rubella: IMMUNE

## 2022-04-29 ENCOUNTER — Other Ambulatory Visit: Payer: Self-pay

## 2022-05-04 ENCOUNTER — Other Ambulatory Visit: Payer: Self-pay | Admitting: Obstetrics and Gynecology

## 2022-05-04 ENCOUNTER — Other Ambulatory Visit: Payer: Self-pay

## 2022-05-04 DIAGNOSIS — Z8759 Personal history of other complications of pregnancy, childbirth and the puerperium: Secondary | ICD-10-CM

## 2022-05-04 DIAGNOSIS — O10919 Unspecified pre-existing hypertension complicating pregnancy, unspecified trimester: Secondary | ICD-10-CM

## 2022-05-04 DIAGNOSIS — Z6841 Body Mass Index (BMI) 40.0 and over, adult: Secondary | ICD-10-CM

## 2022-05-04 DIAGNOSIS — O09522 Supervision of elderly multigravida, second trimester: Secondary | ICD-10-CM

## 2022-05-04 DIAGNOSIS — E039 Hypothyroidism, unspecified: Secondary | ICD-10-CM

## 2022-05-04 DIAGNOSIS — Z363 Encounter for antenatal screening for malformations: Secondary | ICD-10-CM

## 2022-05-13 DIAGNOSIS — Z369 Encounter for antenatal screening, unspecified: Secondary | ICD-10-CM | POA: Diagnosis not present

## 2022-06-02 ENCOUNTER — Inpatient Hospital Stay (HOSPITAL_COMMUNITY)
Admission: AD | Admit: 2022-06-02 | Discharge: 2022-06-03 | Disposition: A | Discharge status: Home / Self Care | Payer: BC Managed Care – PPO | Attending: Obstetrics and Gynecology | Admitting: Obstetrics and Gynecology

## 2022-06-02 ENCOUNTER — Encounter (HOSPITAL_COMMUNITY): Payer: Self-pay

## 2022-06-02 DIAGNOSIS — O26892 Other specified pregnancy related conditions, second trimester: Secondary | ICD-10-CM | POA: Insufficient documentation

## 2022-06-02 DIAGNOSIS — R1084 Generalized abdominal pain: Secondary | ICD-10-CM

## 2022-06-02 DIAGNOSIS — Z8616 Personal history of COVID-19: Secondary | ICD-10-CM | POA: Insufficient documentation

## 2022-06-02 DIAGNOSIS — R0789 Other chest pain: Secondary | ICD-10-CM

## 2022-06-02 DIAGNOSIS — Z3A15 15 weeks gestation of pregnancy: Secondary | ICD-10-CM

## 2022-06-03 ENCOUNTER — Encounter (HOSPITAL_COMMUNITY): Payer: Self-pay | Admitting: Obstetrics and Gynecology

## 2022-06-03 DIAGNOSIS — Z3A15 15 weeks gestation of pregnancy: Secondary | ICD-10-CM

## 2022-06-03 DIAGNOSIS — Z8616 Personal history of COVID-19: Secondary | ICD-10-CM | POA: Diagnosis not present

## 2022-06-03 DIAGNOSIS — O26892 Other specified pregnancy related conditions, second trimester: Secondary | ICD-10-CM | POA: Diagnosis not present

## 2022-06-03 DIAGNOSIS — R0789 Other chest pain: Secondary | ICD-10-CM | POA: Diagnosis not present

## 2022-06-03 DIAGNOSIS — R1084 Generalized abdominal pain: Secondary | ICD-10-CM | POA: Diagnosis not present

## 2022-06-03 LAB — URINALYSIS, ROUTINE W REFLEX MICROSCOPIC
Bilirubin Urine: NEGATIVE
Glucose, UA: NEGATIVE mg/dL
Hgb urine dipstick: NEGATIVE
Ketones, ur: NEGATIVE mg/dL
Leukocytes,Ua: NEGATIVE
Nitrite: NEGATIVE
Protein, ur: NEGATIVE mg/dL
Specific Gravity, Urine: 1.02 (ref 1.005–1.030)
pH: 7 (ref 5.0–8.0)

## 2022-06-03 MED ORDER — FAMOTIDINE 20 MG PO TABS
40.0000 mg | ORAL_TABLET | Freq: Once | ORAL | Status: AC
  Administered 2022-06-03: 40 mg via ORAL
  Filled 2022-06-03: qty 2

## 2022-06-03 NOTE — MAU Provider Note (Signed)
MAU Provider Note  History  [id number]  Arrival date and time: 06/02/22 2300   Chief Complaint  Patient presents with   Abdominal Pain   Chest Pain     HPI Catherine Schroeder is a 37 y.o. [id number] at [redacted]w[redacted]d by LMP with PMHx notable for GERD, hypothyroidism, who presents for chest tightness ans abd tightness that began ~8pm.  The pain got progressively worse while she was driving, and then the pain seems to have subsided while in the waiting room.  She feels a dull ache that comes and goes in her belly and sometimes in her chest.  She does have history of GERD and hypothyroidism.  Currently on 0.5 mcg Synthroid.  She has an appointment with obstetrics next week.   Vaginal bleeding: No LOF: No Fetal Movement: No-15 weeks Contractions: No     OB History     Gravida  7   Para  4   Term  3   Preterm  1   AB  1   Living  4      SAB  1   IAB      Ectopic      Multiple  0   Live Births  4           Past Medical History:  Diagnosis Date   Gestational diabetes mellitus (GDM), antepartum    diet controlled   Gestational hypertension    Headache    History of COVID-19 2021   mild all symptoms resolved   Hypothyroidism    during pregnancy only    Past Surgical History:  Procedure Laterality Date   CESAREAN SECTION N/A 12/21/2017   Procedure: CESAREAN SECTION;  Surgeon: Carrington Clamp, MD;  Location: [facility]TH[facility] SU[facility];  Service: Obstetrics;  Laterality: N/A;   CHOLECYSTECTOMY  01/09/2011   Procedure: LAPAROSCOPIC CHOLECYSTECTOMY WITH INTRAOPERATIVE CHOLANGIOGRAM;  Surgeon: Ernestene Mention, MD;  Location: [facility];  Service: General;  Laterality: N/A;   DILATION AND EVACUATION N/A 04/23/2021   Procedure: SUCTION DILATATION AND EVACUATION AND CHROMOSME STUDIES;  Surgeon: Carrington Clamp, MD;  Location: [facility];  Service: Gynecology;  Laterality: N/A;  urgent suction d&c   DILATION AND EVACUATION N/A 05/06/2021   Procedure: SUCTION DILATATION  AND EVACUATION;  Surgeon: Carrington Clamp, MD;  Location: [facility] OR;  Service: Gynecology;  Laterality: N/A;  ANORA KIT NOT NEEDED   ERCP  01/08/2011   Procedure: ENDOSCOPIC RETROGRADE CHOLANGIOPANCREATOGRAPHY (ERCP);  Surgeon: Rob Bunting, MD;  Location: [facility];  Service: Endoscopy;  Laterality: N/A;   EUS  01/08/2011   Procedure: UPPER ENDOSCOPIC ULTRASOUND (EUS) RADIAL;  Surgeon: Rob Bunting, MD;  Location: [facility];  Service: Endoscopy;  Laterality: N/A;  Wants to look with radial EUS scope prior to ERCP   HERNIA REPAIR     above umbilicus   INSERTION OF MESH N/A 11/12/2013   Procedure: INSERTION OF MESH;  Surgeon: Claud Kelp, MD;  Location: [facility];  Service: General;  Laterality: N/A;   VENTRAL HERNIA REPAIR N/A 11/12/2013   Procedure: LAPAROSCOPIC MULTIPLE  VENTRAL HERNIAS POSSIBLE OPEN;  Surgeon: Claud Kelp, MD;  Location: [facility];  Service: General;  Laterality: N/A;    Family History  Problem Relation Age of Onset   Arthritis Brother    Diabetes Maternal Grandmother     Social History   Socioeconomic History   Marital status: Married    Spouse name: Not on file   Number of children: Not on file   Years of  education: Not on file   Highest education level: Not on file  Occupational History   Not on file  Tobacco Use   Smoking status: Never   Smokeless tobacco: Never  Vaping Use   Vaping Use: Never used  Substance and Sexual Activity   Alcohol use: No   Drug use: No   Sexual activity: Yes    Comment: pregnant  Other Topics Concern   Not on file  Social History Narrative   Not on file   Social Determinants of Health   Financial Resource Strain: Not on file  Food Insecurity: No Food Insecurity (04/13/2021)   Hunger Vital Sign    Worried About Running Out of Food in the Last Year: Never true    Ran Out of Food in the Last Year: Never true  Transportation Needs: Not on file  Physical Activity: Not on file  Stress: Not on file  Social  Connections: Not on file  Intimate Partner Violence: Not on file    Allergies  Allergen Reactions   Cashew Nut (Anacardium Occidentale) Skin Test Hives    Current Facility-Administered Medications on File Prior to Encounter  Medication Dose Route Frequency Provider Last Rate Last Admin   doxycycline (VIBRAMYCIN) 100 mg in dextrose 5 % 250 mL IVPB  100 mg Intravenous Q12H Carrington Clamp, MD   100 mg at 05/06/21 1450   Current Outpatient Medications on File Prior to Encounter  Medication Sig Dispense Refill   labetalol (NORMODYNE) 200 MG tablet Take 0.5 tablets (100 mg total) by mouth 2 (two) times daily. 60 tablet 0   levothyroxine (SYNTHROID) 50 MCG tablet Take 50 mcg by mouth every Monday, Wednesday, and Friday.     prenatal vitamin w/FE, FA (PRENATAL 1 + 1) 27-1 MG TABS Take 1 tablet by mouth daily.     ibuprofen (ADVIL) 200 MG tablet Take 400 mg by mouth every 6 (six) hours as needed for moderate pain or headache.     levothyroxine (SYNTHROID, LEVOTHROID) 25 MCG tablet Take 25 mcg by mouth 4 (four) times a week. Evern Bio, Thurs, and Sat       Review of Systems  Constitutional:  Negative for appetite change, chills and fever.  HENT:  Negative for congestion, facial swelling and postnasal drip.   Eyes:  Negative for photophobia.  Respiratory:  Positive for chest tightness. Negative for cough and shortness of breath.   Cardiovascular:  Negative for chest pain.  Gastrointestinal:  Positive for abdominal pain. Negative for nausea and vomiting.  Endocrine: Negative for polyuria.  Genitourinary:  Negative for flank pain and pelvic pain.  Musculoskeletal:  Negative for arthralgias.  Skin:  Negative for rash.  Neurological:  Negative for weakness and light-headedness.  Psychiatric/Behavioral:  Negative for confusion.     Pertinent positives and negative per HPI, all others reviewed and negative  Physical Exam   BP 128/77 (BP Location: Right Arm)   Pulse 92   Temp 98.7 F  (37.1 C) (Oral)   Resp 18   Ht 5\' 4"  (1.626 m)   Wt 106.6 kg   SpO2 100%   BMI 40.34 kg/m   Patient Vitals for the past 24 hrs:  BP Temp Temp src Pulse Resp SpO2 Height Weight  06/03/22 0011 128/77 98.7 F (37.1 C) Oral 92 18 100 % -- --  06/03/22 0007 -- -- -- -- -- -- 5\' 4"  (1.626 m) 106.6 kg    Physical Exam Vitals reviewed.  Constitutional:      Appearance:  Normal appearance.  HENT:     Head: Normocephalic and atraumatic.     Right Ear: External ear normal.     Left Ear: External ear normal.  Cardiovascular:     Rate and Rhythm: Normal rate and regular rhythm.  Pulmonary:     Effort: Pulmonary effort is normal.     Breath sounds: Normal breath sounds.  Abdominal:     General: Abdomen is flat.     Palpations: Abdomen is soft.  Skin:    General: Skin is warm.     Capillary Refill: Capillary refill takes less than 2 seconds.  Psychiatric:        Mood and Affect: Mood normal.     Cervical Exam    Bedside Ultrasound done  FHT 154 bpm per Doppler  Labs Results for orders placed or performed during the hospital encounter of 06/02/22 (from the past 24 hour(s))  Urinalysis, Routine w reflex microscopic -Urine, Clean Catch     Status: None   Collection Time: 06/03/22 12:19 AM  Result Value Ref Range   Color, Urine YELLOW YELLOW   APPearance CLEAR CLEAR   Specific Gravity, Urine 1.020 1.005 - 1.030   pH 7.0 5.0 - 8.0   Glucose, UA NEGATIVE NEGATIVE mg/dL   Hgb urine dipstick NEGATIVE NEGATIVE   Bilirubin Urine NEGATIVE NEGATIVE   Ketones, ur NEGATIVE NEGATIVE mg/dL   Protein, ur NEGATIVE NEGATIVE mg/dL   Nitrite NEGATIVE NEGATIVE   Leukocytes,Ua NEGATIVE NEGATIVE    Imaging No results found.  MAU Course  Procedures Lab Orders         Urinalysis, Routine w reflex microscopic -Urine, Clean Catch    Meds ordered this encounter  Medications   famotidine (PEPCID) tablet 40 mg   Imaging Orders  No imaging studies ordered today     MDM moderate  Assessment and Plan  1. Chest tightness - Discharge patient  2. Generalized abdominal pain - Discharge patient  3. [redacted] weeks gestation of pregnancy - Discharge patient     Patient seen for chest tightness in the posterior BPM has slowly subsided.  EKG essentially unremarkable.  Some conduction changes seem like incomplete right bundle branch block, however nothing alarming regarding ST wave changes/ischemia.  Patient given famotidine.  Recommended follow-up TSH level in clinic next week.  Gave return and preterm labor precautions.  Follow-up with primary OB.  Dispo: discharged to home in stable condition. Discharge Instructions     Discharge patient   Complete by: As directed    Discharge disposition: 01-Home or Self Care   Discharge patient date: 06/03/2022      Allergies as of 06/03/2022       Reactions   Cashew Nut (anacardium Occidentale) Skin Test Hives        Medication List     TAKE these medications    ibuprofen 200 MG tablet Commonly known as: ADVIL Take 400 mg by mouth every 6 (six) hours as needed for moderate pain or headache.   labetalol 200 MG tablet Commonly known as: NORMODYNE Take 0.5 tablets (100 mg total) by mouth 2 (two) times daily.   levothyroxine 25 MCG tablet Commonly known as: SYNTHROID Take 25 mcg by mouth 4 (four) times a week. Evern Bio, Thurs, and Sat   levothyroxine 50 MCG tablet Commonly known as: SYNTHROID Take 50 mcg by mouth every Monday, Wednesday, and Friday.   prenatal vitamin w/FE, FA 27-1 MG Tabs tablet Take 1 tablet by mouth daily.  Myrtie Hawk, DO FMOB Fellow, Faculty practice [facility], Center for [facility] 06/03/22  1:55 AM

## 2022-06-03 NOTE — MAU Note (Signed)
..  Catherine Schroeder is a 37 y.o. at [redacted]w[redacted]d here in MAU reporting: chest tightness and abdominal tightness that began around 8 pm. The tightening comes and goes.  Denies vaginal bleeding or leaking of fluid.   Pain score: 4/10 Vitals:   06/03/22 0011  BP: 128/77  Pulse: 92  Resp: 18  Temp: 98.7 F (37.1 C)  SpO2: 100%     FHT:154 Lab orders placed from triage: UA

## 2022-06-10 DIAGNOSIS — Z3482 Encounter for supervision of other normal pregnancy, second trimester: Secondary | ICD-10-CM | POA: Diagnosis not present

## 2022-06-10 DIAGNOSIS — E039 Hypothyroidism, unspecified: Secondary | ICD-10-CM | POA: Diagnosis not present

## 2022-06-10 DIAGNOSIS — Z369 Encounter for antenatal screening, unspecified: Secondary | ICD-10-CM | POA: Diagnosis not present

## 2022-06-23 DIAGNOSIS — O99210 Obesity complicating pregnancy, unspecified trimester: Secondary | ICD-10-CM | POA: Insufficient documentation

## 2022-06-23 DIAGNOSIS — O09529 Supervision of elderly multigravida, unspecified trimester: Secondary | ICD-10-CM | POA: Insufficient documentation

## 2022-06-23 DIAGNOSIS — Z8759 Personal history of other complications of pregnancy, childbirth and the puerperium: Secondary | ICD-10-CM | POA: Insufficient documentation

## 2022-06-23 DIAGNOSIS — O10919 Unspecified pre-existing hypertension complicating pregnancy, unspecified trimester: Secondary | ICD-10-CM | POA: Insufficient documentation

## 2022-06-25 ENCOUNTER — Encounter: Payer: Self-pay | Admitting: *Deleted

## 2022-06-25 ENCOUNTER — Ambulatory Visit: Payer: BC Managed Care – PPO | Admitting: *Deleted

## 2022-06-25 ENCOUNTER — Other Ambulatory Visit: Payer: Self-pay | Admitting: *Deleted

## 2022-06-25 ENCOUNTER — Ambulatory Visit: Payer: BC Managed Care – PPO | Attending: Obstetrics and Gynecology

## 2022-06-25 VITALS — BP 121/72 | HR 89

## 2022-06-25 DIAGNOSIS — Z6841 Body Mass Index (BMI) 40.0 and over, adult: Secondary | ICD-10-CM | POA: Insufficient documentation

## 2022-06-25 DIAGNOSIS — O34219 Maternal care for unspecified type scar from previous cesarean delivery: Secondary | ICD-10-CM | POA: Insufficient documentation

## 2022-06-25 DIAGNOSIS — E669 Obesity, unspecified: Secondary | ICD-10-CM

## 2022-06-25 DIAGNOSIS — Z8759 Personal history of other complications of pregnancy, childbirth and the puerperium: Secondary | ICD-10-CM

## 2022-06-25 DIAGNOSIS — O99280 Endocrine, nutritional and metabolic diseases complicating pregnancy, unspecified trimester: Secondary | ICD-10-CM

## 2022-06-25 DIAGNOSIS — O09529 Supervision of elderly multigravida, unspecified trimester: Secondary | ICD-10-CM

## 2022-06-25 DIAGNOSIS — O10919 Unspecified pre-existing hypertension complicating pregnancy, unspecified trimester: Secondary | ICD-10-CM

## 2022-06-25 DIAGNOSIS — O99210 Obesity complicating pregnancy, unspecified trimester: Secondary | ICD-10-CM | POA: Diagnosis present

## 2022-06-25 DIAGNOSIS — O10912 Unspecified pre-existing hypertension complicating pregnancy, second trimester: Secondary | ICD-10-CM

## 2022-06-25 DIAGNOSIS — O09899 Supervision of other high risk pregnancies, unspecified trimester: Secondary | ICD-10-CM

## 2022-06-25 DIAGNOSIS — O4590 Premature separation of placenta, unspecified, unspecified trimester: Secondary | ICD-10-CM | POA: Diagnosis not present

## 2022-06-25 DIAGNOSIS — O99212 Obesity complicating pregnancy, second trimester: Secondary | ICD-10-CM

## 2022-06-25 DIAGNOSIS — O43192 Other malformation of placenta, second trimester: Secondary | ICD-10-CM | POA: Diagnosis not present

## 2022-06-25 DIAGNOSIS — O09212 Supervision of pregnancy with history of pre-term labor, second trimester: Secondary | ICD-10-CM | POA: Diagnosis not present

## 2022-06-25 DIAGNOSIS — O10012 Pre-existing essential hypertension complicating pregnancy, second trimester: Secondary | ICD-10-CM | POA: Diagnosis not present

## 2022-06-25 DIAGNOSIS — O99282 Endocrine, nutritional and metabolic diseases complicating pregnancy, second trimester: Secondary | ICD-10-CM | POA: Insufficient documentation

## 2022-06-25 DIAGNOSIS — O09522 Supervision of elderly multigravida, second trimester: Secondary | ICD-10-CM

## 2022-06-25 DIAGNOSIS — E039 Hypothyroidism, unspecified: Secondary | ICD-10-CM | POA: Diagnosis not present

## 2022-06-25 DIAGNOSIS — Z3A19 19 weeks gestation of pregnancy: Secondary | ICD-10-CM | POA: Diagnosis not present

## 2022-07-09 DIAGNOSIS — Z369 Encounter for antenatal screening, unspecified: Secondary | ICD-10-CM | POA: Diagnosis not present

## 2022-07-30 ENCOUNTER — Ambulatory Visit: Payer: BC Managed Care – PPO | Attending: Maternal & Fetal Medicine

## 2022-07-30 DIAGNOSIS — O10912 Unspecified pre-existing hypertension complicating pregnancy, second trimester: Secondary | ICD-10-CM | POA: Diagnosis not present

## 2022-07-30 DIAGNOSIS — O43192 Other malformation of placenta, second trimester: Secondary | ICD-10-CM

## 2022-07-30 DIAGNOSIS — E039 Hypothyroidism, unspecified: Secondary | ICD-10-CM | POA: Diagnosis not present

## 2022-07-30 DIAGNOSIS — O09292 Supervision of pregnancy with other poor reproductive or obstetric history, second trimester: Secondary | ICD-10-CM

## 2022-07-30 DIAGNOSIS — O10012 Pre-existing essential hypertension complicating pregnancy, second trimester: Secondary | ICD-10-CM

## 2022-07-30 DIAGNOSIS — O34219 Maternal care for unspecified type scar from previous cesarean delivery: Secondary | ICD-10-CM

## 2022-07-30 DIAGNOSIS — Z8759 Personal history of other complications of pregnancy, childbirth and the puerperium: Secondary | ICD-10-CM | POA: Diagnosis not present

## 2022-07-30 DIAGNOSIS — O09522 Supervision of elderly multigravida, second trimester: Secondary | ICD-10-CM | POA: Diagnosis not present

## 2022-07-30 DIAGNOSIS — O99212 Obesity complicating pregnancy, second trimester: Secondary | ICD-10-CM | POA: Diagnosis not present

## 2022-07-30 DIAGNOSIS — O99282 Endocrine, nutritional and metabolic diseases complicating pregnancy, second trimester: Secondary | ICD-10-CM | POA: Insufficient documentation

## 2022-07-30 DIAGNOSIS — E669 Obesity, unspecified: Secondary | ICD-10-CM

## 2022-07-30 DIAGNOSIS — O09899 Supervision of other high risk pregnancies, unspecified trimester: Secondary | ICD-10-CM | POA: Diagnosis not present

## 2022-07-30 DIAGNOSIS — O09212 Supervision of pregnancy with history of pre-term labor, second trimester: Secondary | ICD-10-CM

## 2022-07-30 DIAGNOSIS — Z3A24 24 weeks gestation of pregnancy: Secondary | ICD-10-CM

## 2022-08-02 ENCOUNTER — Other Ambulatory Visit: Payer: Self-pay | Admitting: *Deleted

## 2022-08-02 DIAGNOSIS — O09522 Supervision of elderly multigravida, second trimester: Secondary | ICD-10-CM

## 2022-08-02 DIAGNOSIS — O99212 Obesity complicating pregnancy, second trimester: Secondary | ICD-10-CM

## 2022-08-02 DIAGNOSIS — Z8759 Personal history of other complications of pregnancy, childbirth and the puerperium: Secondary | ICD-10-CM

## 2022-08-02 DIAGNOSIS — O10912 Unspecified pre-existing hypertension complicating pregnancy, second trimester: Secondary | ICD-10-CM

## 2022-08-02 DIAGNOSIS — O43199 Other malformation of placenta, unspecified trimester: Secondary | ICD-10-CM

## 2022-08-02 DIAGNOSIS — E039 Hypothyroidism, unspecified: Secondary | ICD-10-CM

## 2022-08-04 DIAGNOSIS — Z3480 Encounter for supervision of other normal pregnancy, unspecified trimester: Secondary | ICD-10-CM | POA: Diagnosis not present

## 2022-08-04 DIAGNOSIS — O99019 Anemia complicating pregnancy, unspecified trimester: Secondary | ICD-10-CM | POA: Diagnosis not present

## 2022-08-04 DIAGNOSIS — E039 Hypothyroidism, unspecified: Secondary | ICD-10-CM | POA: Diagnosis not present

## 2022-08-04 DIAGNOSIS — Z369 Encounter for antenatal screening, unspecified: Secondary | ICD-10-CM | POA: Diagnosis not present

## 2022-08-10 NOTE — Progress Notes (Signed)
Cardio-Obstetrics Clinic  New Evaluation  Date:  08/13/2022   ID:  Catherine Schroeder, DOB [birth year], MRN [id number]  PCP:  Pcp, No    HeartCare Providers Cardiologist:  None  Electrophysiologist:  None       Referring MD: Charlett Nose, MD   Chief Complaint: HTN  History of Present Illness:    Catherine Schroeder is a 37 y.o. female [[id number]] who is being seen today for the evaluation of HTN at the request of Charlett Nose, MD.   Today, the patient is currently [redacted]w[redacted]d pregnant. Overall patient feels well. States she developed hypertension after last pregnancy. She was started nifedipine and labetalol at that time with good pressure control. Has been maintained on this medication while pregnant with good blood pressure control. Has occasional dyspnea on exertion, but no exertional chest pain. Also with intermittent LE edema usually with prolonged standing or sitting that improves with leg elevation.   No history of pre-eclampsia. Has history of gestational diabetes.   Plans for delivery at 37-38weeks   Prior CV Studies Reviewed: The following studies were reviewed today: No CV studies  Past Medical History:  Diagnosis Date   GERD (gastroesophageal reflux disease) 08/02/2010   Gestational diabetes mellitus (GDM), antepartum    diet controlled   Gestational hypertension    Headache    History of COVID-19 2021   mild all symptoms resolved   Hypothyroidism    during pregnancy only   Incisional hernia 11/12/2013   Normal vaginal delivery 06/11/2015   Postoperative state 12/21/2017   ROM (rupture of membranes), premature 06/11/2015   UTI (urinary tract infection)     Past Surgical History:  Procedure Laterality Date   CESAREAN SECTION N/A 12/21/2017   Procedure: CESAREAN SECTION;  Surgeon: Carrington Clamp, MD;  Location: [facility]IRTHING SUIT[facility];  Service: Obstetrics;  Laterality: N/A;   CHOLECYSTECTOMY  01/09/2011   Procedure: LAPAROSCOPIC CHOLECYSTECTOMY  WITH INTRAOPERATIVE CHOLANGIOGRAM;  Surgeon: Ernestene Mention, MD;  Location: [facility];  Service: General;  Laterality: N/A;   DILATION AND EVACUATION N/A 04/23/2021   Procedure: SUCTION DILATATION AND EVACUATION AND CHROMOSME STUDIES;  Surgeon: Carrington Clamp, MD;  Location: [facility];  Service: Gynecology;  Laterality: N/A;  urgent suction d&c   DILATION AND EVACUATION N/A 05/06/2021   Procedure: SUCTION DILATATION AND EVACUATION;  Surgeon: Carrington Clamp, MD;  Location: [facility] OR;  Service: Gynecology;  Laterality: N/A;  ANORA KIT NOT NEEDED   ERCP  01/08/2011   Procedure: ENDOSCOPIC RETROGRADE CHOLANGIOPANCREATOGRAPHY (ERCP);  Surgeon: Rob Bunting, MD;  Location: [facility];  Service: Endoscopy;  Laterality: N/A;   EUS  01/08/2011   Procedure: UPPER ENDOSCOPIC ULTRASOUND (EUS) RADIAL;  Surgeon: Rob Bunting, MD;  Location: [facility];  Service: Endoscopy;  Laterality: N/A;  Wants to look with radial EUS scope prior to ERCP   HERNIA REPAIR     above umbilicus   INSERTION OF MESH N/A 11/12/2013   Procedure: INSERTION OF MESH;  Surgeon: Claud Kelp, MD;  Location: [facility];  Service: General;  Laterality: N/A;   VENTRAL HERNIA REPAIR N/A 11/12/2013   Procedure: LAPAROSCOPIC MULTIPLE  VENTRAL HERNIAS POSSIBLE OPEN;  Surgeon: Claud Kelp, MD;  Location: [facility];  Service: General;  Laterality: N/A;      OB History     Gravida  7   Para  4   Term  3   Preterm  1   AB  2   Living  4  SAB  2   IAB      Ectopic      Multiple  0   Live Births  4               Current Medications: Current Meds  Medication Sig   labetalol (NORMODYNE) 200 MG tablet Take 0.5 tablets (100 mg total) by mouth 2 (two) times daily.   levothyroxine (SYNTHROID) 50 MCG tablet Take 50 mcg by mouth every Monday, Wednesday, and Friday.   levothyroxine (SYNTHROID, LEVOTHROID) 25 MCG tablet Take 25 mcg by mouth 4 (four) times a week. Evern Bio, Thurs, and Sat   NIFEdipine  (PROCARDIA PO) Take 30 mg by mouth.   Ondansetron HCl (ZOFRAN PO) Take by mouth.   prenatal vitamin w/FE, FA (PRENATAL 1 + 1) 27-1 MG TABS Take 1 tablet by mouth daily.     Allergies:   Cashew nut (anacardium occidentale) skin test   Social History   Socioeconomic History   Marital status: Married    Spouse name: Not on file   Number of children: Not on file   Years of education: Not on file   Highest education level: Not on file  Occupational History   Not on file  Tobacco Use   Smoking status: Never   Smokeless tobacco: Never  Vaping Use   Vaping status: Never Used  Substance and Sexual Activity   Alcohol use: No   Drug use: No   Sexual activity: Yes    Comment: pregnant  Other Topics Concern   Not on file  Social History Narrative   Not on file   Social Determinants of Health   Financial Resource Strain: Not on file  Food Insecurity: No Food Insecurity (04/13/2021)   Hunger Vital Sign    Worried About Running Out of Food in the Last Year: Never true    Ran Out of Food in the Last Year: Never true  Transportation Needs: Not on file  Physical Activity: Not on file  Stress: Not on file  Social Connections: Not on file      Family History  Problem Relation Age of Onset   Arthritis Brother    Diabetes Maternal Grandmother       ROS:   Please see the history of present illness.     All other systems reviewed and are negative.   Labs/EKG Reviewed:    EKG:   EKG is not ordered today.    Recent Labs: No results found for requested labs within last 365 days.   Recent Lipid Panel No results found for: "CHOL", "TRIG", "HDL", "CHOLHDL", "LDLCALC", "LDLDIRECT"  Physical Exam:    VS:  BP 120/70   Pulse 94   Ht 5' 4.5" (1.638 m)   Wt 238 lb (108 kg)   LMP 02/12/2022   SpO2 98%   BMI 40.22 kg/m     Wt Readings from Last 3 Encounters:  08/13/22 238 lb (108 kg)  06/03/22 235 lb (106.6 kg)  05/06/21 231 lb (104.8 kg)     GEN:  Well nourished, well  developed in no acute distress HEENT: Normal NECK: No JVD; No carotid bruits CARDIAC: RRR, 2/6 systolic murmur RESPIRATORY:  Clear to auscultation without rales, wheezing or rhonchi  ABDOMEN: Gravid, non-tender to palpitation MUSCULOSKELETAL:  No edema; No deformity  SKIN: Warm and dry NEUROLOGIC:  Alert and oriented x 3 PSYCHIATRIC:  Normal affect    Risk Assessment/Risk Calculators:     ASSESSMENT & PLAN:    #Chronic  HTN Affecting Pregnancy: -BP currently very well controlled and at goal -Continue nifedipine 30mg  daily -Continue labetalol 200mg  BID -Continue ASA 81mg  daily -Discussed she is higher risk for pre-eclampsia and will need close monitoring of BP throughout pregnancy  #Incomplete RBBB: -Stable and chronic. No further work-up needed  #Systolic Murmur: -Check TTE  Patient Instructions  Medication Instructions:   Your physician recommends that you continue on your current medications as directed. Please refer to the Current Medication list given to you today.  *If you need a refill on your cardiac medications before your next appointment, please call your pharmacy*    Testing/Procedures:  Your physician has requested that you have an OB echocardiogram. Echocardiography is a painless test that uses sound waves to create images of your heart. It provides your doctor with information about the size and shape of your heart and how well your heart's chambers and valves are working. This procedure takes approximately one hour. There are no restrictions for this procedure.  Cardiac-OB patient: to be performed by [place] or [place].  OB ECHO ORDER PLACED UNDER DR. KARDIE TOBB FOR PATIENT WILL BE ESTABLISHING CARE WITH HER  Please do NOT wear cologne, perfume, aftershave, or lotions (deodorant is allowed). Please arrive 15 minutes prior to your appointment time.    Follow-Up:  8 WEEKS WITH DR. KARDIE TOBB EITHER HERE AT [facility] CLINIC OR OUR [place]  OFFICE    Dispo:  No follow-ups on file.   Medication Adjustments/Labs and Tests Ordered: Current medicines are reviewed at length with the patient today.  Concerns regarding medicines are outlined above.  Tests Ordered: Orders Placed This Encounter  Procedures   ECHOCARDIOGRAM COMPLETE   Medication Changes: No orders of the defined types were placed in this encounter.

## 2022-08-11 DIAGNOSIS — O99810 Abnormal glucose complicating pregnancy: Secondary | ICD-10-CM | POA: Diagnosis not present

## 2022-08-11 DIAGNOSIS — O99019 Anemia complicating pregnancy, unspecified trimester: Secondary | ICD-10-CM | POA: Diagnosis not present

## 2022-08-13 ENCOUNTER — Ambulatory Visit (INDEPENDENT_AMBULATORY_CARE_PROVIDER_SITE_OTHER): Payer: BC Managed Care – PPO | Admitting: Cardiology

## 2022-08-13 ENCOUNTER — Encounter: Payer: Self-pay | Admitting: Cardiology

## 2022-08-13 VITALS — BP 120/70 | HR 94 | Ht 64.5 in | Wt 238.0 lb

## 2022-08-13 DIAGNOSIS — O10919 Unspecified pre-existing hypertension complicating pregnancy, unspecified trimester: Secondary | ICD-10-CM

## 2022-08-13 DIAGNOSIS — R011 Cardiac murmur, unspecified: Secondary | ICD-10-CM

## 2022-08-13 DIAGNOSIS — R5383 Other fatigue: Secondary | ICD-10-CM

## 2022-08-13 DIAGNOSIS — Z3A26 26 weeks gestation of pregnancy: Secondary | ICD-10-CM

## 2022-08-13 NOTE — Patient Instructions (Signed)
Medication Instructions:   Your physician recommends that you continue on your current medications as directed. Please refer to the Current Medication list given to you today.  *If you need a refill on your cardiac medications before your next appointment, please call your pharmacy*    Testing/Procedures:  Your physician has requested that you have an OB echocardiogram. Echocardiography is a painless test that uses sound waves to create images of your heart. It provides your doctor with information about the size and shape of your heart and how well your heart's chambers and valves are working. This procedure takes approximately one hour. There are no restrictions for this procedure.  Cardiac-OB patient: to be performed by [place] or [place].  OB ECHO ORDER PLACED UNDER DR. KARDIE TOBB FOR PATIENT WILL BE ESTABLISHING CARE WITH HER  Please do NOT wear cologne, perfume, aftershave, or lotions (deodorant is allowed). Please arrive 15 minutes prior to your appointment time.    Follow-Up:  8 WEEKS WITH DR. KARDIE TOBB EITHER HERE AT [facility] CLINIC OR OUR [facility] OFFICE

## 2022-08-30 ENCOUNTER — Ambulatory Visit: Payer: BC Managed Care – PPO | Admitting: *Deleted

## 2022-08-30 ENCOUNTER — Ambulatory Visit: Payer: BC Managed Care – PPO | Attending: Obstetrics and Gynecology

## 2022-08-30 ENCOUNTER — Other Ambulatory Visit: Payer: Self-pay | Admitting: *Deleted

## 2022-08-30 VITALS — BP 121/70 | HR 91

## 2022-08-30 DIAGNOSIS — E669 Obesity, unspecified: Secondary | ICD-10-CM

## 2022-08-30 DIAGNOSIS — O09523 Supervision of elderly multigravida, third trimester: Secondary | ICD-10-CM | POA: Insufficient documentation

## 2022-08-30 DIAGNOSIS — O99280 Endocrine, nutritional and metabolic diseases complicating pregnancy, unspecified trimester: Secondary | ICD-10-CM | POA: Diagnosis not present

## 2022-08-30 DIAGNOSIS — O09213 Supervision of pregnancy with history of pre-term labor, third trimester: Secondary | ICD-10-CM

## 2022-08-30 DIAGNOSIS — O10013 Pre-existing essential hypertension complicating pregnancy, third trimester: Secondary | ICD-10-CM | POA: Diagnosis not present

## 2022-08-30 DIAGNOSIS — O09522 Supervision of elderly multigravida, second trimester: Secondary | ICD-10-CM | POA: Insufficient documentation

## 2022-08-30 DIAGNOSIS — O10913 Unspecified pre-existing hypertension complicating pregnancy, third trimester: Secondary | ICD-10-CM

## 2022-08-30 DIAGNOSIS — O43199 Other malformation of placenta, unspecified trimester: Secondary | ICD-10-CM | POA: Diagnosis not present

## 2022-08-30 DIAGNOSIS — O4593 Premature separation of placenta, unspecified, third trimester: Secondary | ICD-10-CM | POA: Diagnosis not present

## 2022-08-30 DIAGNOSIS — Z3A28 28 weeks gestation of pregnancy: Secondary | ICD-10-CM

## 2022-08-30 DIAGNOSIS — O99213 Obesity complicating pregnancy, third trimester: Secondary | ICD-10-CM

## 2022-08-30 DIAGNOSIS — O10912 Unspecified pre-existing hypertension complicating pregnancy, second trimester: Secondary | ICD-10-CM | POA: Insufficient documentation

## 2022-08-30 DIAGNOSIS — O43193 Other malformation of placenta, third trimester: Secondary | ICD-10-CM | POA: Diagnosis not present

## 2022-08-30 DIAGNOSIS — O34219 Maternal care for unspecified type scar from previous cesarean delivery: Secondary | ICD-10-CM

## 2022-08-30 DIAGNOSIS — E039 Hypothyroidism, unspecified: Secondary | ICD-10-CM | POA: Insufficient documentation

## 2022-08-30 DIAGNOSIS — O99212 Obesity complicating pregnancy, second trimester: Secondary | ICD-10-CM | POA: Insufficient documentation

## 2022-08-30 DIAGNOSIS — Z8759 Personal history of other complications of pregnancy, childbirth and the puerperium: Secondary | ICD-10-CM | POA: Diagnosis not present

## 2022-08-30 DIAGNOSIS — O99283 Endocrine, nutritional and metabolic diseases complicating pregnancy, third trimester: Secondary | ICD-10-CM

## 2022-09-01 DIAGNOSIS — Z369 Encounter for antenatal screening, unspecified: Secondary | ICD-10-CM | POA: Diagnosis not present

## 2022-09-07 ENCOUNTER — Ambulatory Visit (HOSPITAL_COMMUNITY): Payer: BC Managed Care – PPO | Attending: Cardiology

## 2022-09-07 DIAGNOSIS — R011 Cardiac murmur, unspecified: Secondary | ICD-10-CM | POA: Diagnosis not present

## 2022-09-07 DIAGNOSIS — Z3A30 30 weeks gestation of pregnancy: Secondary | ICD-10-CM | POA: Diagnosis not present

## 2022-09-07 DIAGNOSIS — R5383 Other fatigue: Secondary | ICD-10-CM | POA: Diagnosis not present

## 2022-09-07 DIAGNOSIS — O10913 Unspecified pre-existing hypertension complicating pregnancy, third trimester: Secondary | ICD-10-CM | POA: Diagnosis not present

## 2022-09-07 DIAGNOSIS — O10919 Unspecified pre-existing hypertension complicating pregnancy, unspecified trimester: Secondary | ICD-10-CM | POA: Insufficient documentation

## 2022-09-07 LAB — ECHOCARDIOGRAM COMPLETE
AR max vel: 1.77 cm2
AV Area VTI: 1.83 cm2
AV Area mean vel: 1.79 cm2
AV Mean grad: 8.8 mmHg
AV Peak grad: 16.5 mmHg
Ao pk vel: 2.03 m/s
Area-P 1/2: 4.31 cm2
S' Lateral: 2.95 cm

## 2022-09-16 DIAGNOSIS — Z0000 Encounter for general adult medical examination without abnormal findings: Secondary | ICD-10-CM | POA: Diagnosis not present

## 2022-09-17 DIAGNOSIS — Z369 Encounter for antenatal screening, unspecified: Secondary | ICD-10-CM | POA: Diagnosis not present

## 2022-09-24 ENCOUNTER — Ambulatory Visit: Payer: BC Managed Care – PPO | Attending: Obstetrics and Gynecology

## 2022-09-24 ENCOUNTER — Ambulatory Visit: Payer: BC Managed Care – PPO | Admitting: *Deleted

## 2022-09-24 VITALS — BP 116/72 | HR 80

## 2022-09-24 DIAGNOSIS — O09293 Supervision of pregnancy with other poor reproductive or obstetric history, third trimester: Secondary | ICD-10-CM

## 2022-09-24 DIAGNOSIS — E669 Obesity, unspecified: Secondary | ICD-10-CM

## 2022-09-24 DIAGNOSIS — O99213 Obesity complicating pregnancy, third trimester: Secondary | ICD-10-CM

## 2022-09-24 DIAGNOSIS — O09522 Supervision of elderly multigravida, second trimester: Secondary | ICD-10-CM | POA: Insufficient documentation

## 2022-09-24 DIAGNOSIS — O10913 Unspecified pre-existing hypertension complicating pregnancy, third trimester: Secondary | ICD-10-CM

## 2022-09-24 DIAGNOSIS — O99280 Endocrine, nutritional and metabolic diseases complicating pregnancy, unspecified trimester: Secondary | ICD-10-CM | POA: Insufficient documentation

## 2022-09-24 DIAGNOSIS — Z3A32 32 weeks gestation of pregnancy: Secondary | ICD-10-CM

## 2022-09-24 DIAGNOSIS — Z8759 Personal history of other complications of pregnancy, childbirth and the puerperium: Secondary | ICD-10-CM | POA: Diagnosis not present

## 2022-09-24 DIAGNOSIS — O99212 Obesity complicating pregnancy, second trimester: Secondary | ICD-10-CM | POA: Diagnosis not present

## 2022-09-24 DIAGNOSIS — O09523 Supervision of elderly multigravida, third trimester: Secondary | ICD-10-CM

## 2022-09-24 DIAGNOSIS — O10013 Pre-existing essential hypertension complicating pregnancy, third trimester: Secondary | ICD-10-CM

## 2022-09-24 DIAGNOSIS — E039 Hypothyroidism, unspecified: Secondary | ICD-10-CM | POA: Insufficient documentation

## 2022-09-24 DIAGNOSIS — O43199 Other malformation of placenta, unspecified trimester: Secondary | ICD-10-CM | POA: Insufficient documentation

## 2022-09-24 DIAGNOSIS — O10912 Unspecified pre-existing hypertension complicating pregnancy, second trimester: Secondary | ICD-10-CM | POA: Insufficient documentation

## 2022-09-24 DIAGNOSIS — O34219 Maternal care for unspecified type scar from previous cesarean delivery: Secondary | ICD-10-CM

## 2022-09-24 DIAGNOSIS — O43193 Other malformation of placenta, third trimester: Secondary | ICD-10-CM

## 2022-09-28 DIAGNOSIS — Z3A32 32 weeks gestation of pregnancy: Secondary | ICD-10-CM | POA: Diagnosis not present

## 2022-09-28 DIAGNOSIS — O43129 Velamentous insertion of umbilical cord, unspecified trimester: Secondary | ICD-10-CM | POA: Diagnosis not present

## 2022-09-28 DIAGNOSIS — E039 Hypothyroidism, unspecified: Secondary | ICD-10-CM | POA: Diagnosis not present

## 2022-10-01 ENCOUNTER — Ambulatory Visit (INDEPENDENT_AMBULATORY_CARE_PROVIDER_SITE_OTHER): Payer: BC Managed Care – PPO | Admitting: Cardiology

## 2022-10-01 ENCOUNTER — Encounter: Payer: Self-pay | Admitting: Cardiology

## 2022-10-01 VITALS — BP 118/88 | HR 98 | Ht 65.0 in | Wt 240.0 lb

## 2022-10-01 DIAGNOSIS — O10919 Unspecified pre-existing hypertension complicating pregnancy, unspecified trimester: Secondary | ICD-10-CM

## 2022-10-01 DIAGNOSIS — O99210 Obesity complicating pregnancy, unspecified trimester: Secondary | ICD-10-CM

## 2022-10-01 DIAGNOSIS — O0993 Supervision of high risk pregnancy, unspecified, third trimester: Secondary | ICD-10-CM

## 2022-10-01 DIAGNOSIS — O09523 Supervision of elderly multigravida, third trimester: Secondary | ICD-10-CM

## 2022-10-01 DIAGNOSIS — Z3A33 33 weeks gestation of pregnancy: Secondary | ICD-10-CM | POA: Diagnosis not present

## 2022-10-01 NOTE — Progress Notes (Signed)
Cardio-Obstetrics Clinic  Follow Up Note   Date:  10/01/2022   ID:  JANEMARIE Schroeder, DOB 15-Feb-1985, MRN [id number]  PCP:  Aviva Kluver   [facility] Providers Cardiologist:  Thomasene Ripple, DO  Electrophysiologist:  None        Referring MD: No ref. provider found   Chief Complaint: " I am ok"  History of Present Illness:    Catherine Schroeder is a 37 y.o. female [[id number]] who returns for follow up of gestational diabetes, chronic hypertension in pregnancy, noted incomplete right bundle branch block on EKG.  She was seen by Dr. Shari Prows in July 2024 at that time blood pressure was well-controlled she continue her nifedipine as well as her labetalol 200 mg twice daily.  She continue her aspirin 81 mg for preeclampsia prophylaxis.  She is here today with her son.  She offers no specific complaints.  She is 33 weeks today.  No chest pain or shortness of breath.  She has not been taking her blood pressure at home and she is trying to do better with that she tells me.   Prior CV Studies Reviewed: The f/ollowing studies were reviewed today: TTE 09-15-2022 IMPRESSIONS     1. Left ventricular ejection fraction, by estimation, is 70 to 75%. The  left ventricle has hyperdynamic function. The left ventricle has no  regional wall motion abnormalities. Left ventricular diastolic parameters  were normal.   2. Right ventricular systolic function is normal. The right ventricular  size is normal.   3. The mitral valve is normal in structure. No evidence of mitral valve  regurgitation. No evidence of mitral stenosis.   4. The aortic valve is normal in structure. Aortic valve regurgitation is  not visualized. No aortic stenosis is present.   5. The inferior vena cava is normal in size with greater than 50%  respiratory variability, suggesting right atrial pressure of 3 mmHg.   FINDINGS   Left Ventricle: Left ventricular ejection fraction, by estimation, is 70  to 75%. The left ventricle  has hyperdynamic function. The left ventricle  has no regional wall motion abnormalities. The left ventricular internal  cavity size was normal in size.  There is no left ventricular hypertrophy. Left ventricular diastolic  parameters were normal.   Right Ventricle: The right ventricular size is normal. No increase in  right ventricular wall thickness. Right ventricular systolic function is  normal.   Left Atrium: Left atrial size was normal in size.   Right Atrium: Right atrial size was normal in size.   Pericardium: There is no evidence of pericardial effusion.   Mitral Valve: The mitral valve is normal in structure. No evidence of  mitral valve regurgitation. No evidence of mitral valve stenosis.   Tricuspid Valve: The tricuspid valve is normal in structure. Tricuspid  valve regurgitation is not demonstrated. No evidence of tricuspid  stenosis.   Aortic Valve: The aortic valve is normal in structure. Aortic valve  regurgitation is not visualized. No aortic stenosis is present. Aortic  valve mean gradient measures 8.8 mmHg. Aortic valve peak gradient measures  16.5 mmHg. Aortic valve area, by VTI  measures 1.83 cm.   Pulmonic Valve: The pulmonic valve was normal in structure. Pulmonic valve  regurgitation is trivial. No evidence of pulmonic stenosis.   Aorta: The aortic root is normal in size and structure.   Venous: The inferior vena cava is normal in size with greater than 50%  respiratory variability, suggesting right atrial pressure of  3 mmHg.   IAS/Shunts: No atrial level shunt detected by color flow Doppler.    Past Medical History:  Diagnosis Date   GERD (gastroesophageal reflux disease) 08/02/2010   Gestational diabetes mellitus (GDM), antepartum    diet controlled   Gestational hypertension    Headache    History of COVID-19 2021   mild all symptoms resolved   Hypothyroidism    during pregnancy only   Incisional hernia 11/12/2013   Normal vaginal  delivery 06/11/2015   Postoperative state 12/21/2017   ROM (rupture of membranes), premature 06/11/2015   UTI (urinary tract infection)     Past Surgical History:  Procedure Laterality Date   CESAREAN SECTION N/A 12/21/2017   Procedure: CESAREAN SECTION;  Surgeon: Carrington Clamp, MD;  Location: [facility] BIRTHING SUITES;  Service: Obstetrics;  Laterality: N/A;   CHOLECYSTECTOMY  01/09/2011   Procedure: LAPAROSCOPIC CHOLECYSTECTOMY WITH INTRAOPERATIVE CHOLANGIOGRAM;  Surgeon: Ernestene Mention, MD;  Location: [facility];  Service: General;  Laterality: N/A;   DILATION AND EVACUATION N/A 04/23/2021   Procedure: SUCTION DILATATION AND EVACUATION AND CHROMOSME STUDIES;  Surgeon: Carrington Clamp, MD;  Location: [facility];  Service: Gynecology;  Laterality: N/A;  urgent suction d&c   DILATION AND EVACUATION N/A 05/06/2021   Procedure: SUCTION DILATATION AND EVACUATION;  Surgeon: Carrington Clamp, MD;  Location: [facility] OR;  Service: Gynecology;  Laterality: N/A;  ANORA KIT NOT NEEDED   ERCP  01/08/2011   Procedure: ENDOSCOPIC RETROGRADE CHOLANGIOPANCREATOGRAPHY (ERCP);  Surgeon: Rob Bunting, MD;  Location: [facility];  Service: Endoscopy;  Laterality: N/A;   EUS  01/08/2011   Procedure: UPPER ENDOSCOPIC ULTRASOUND (EUS) RADIAL;  Surgeon: Rob Bunting, MD;  Location: [facility];  Service: Endoscopy;  Laterality: N/A;  Wants to look with radial EUS scope prior to ERCP   HERNIA REPAIR     above umbilicus   INSERTION OF MESH N/A 11/12/2013   Procedure: INSERTION OF MESH;  Surgeon: Claud Kelp, MD;  Location: [facility];  Service: General;  Laterality: N/A;   VENTRAL HERNIA REPAIR N/A 11/12/2013   Procedure: LAPAROSCOPIC MULTIPLE  VENTRAL HERNIAS POSSIBLE OPEN;  Surgeon: Claud Kelp, MD;  Location: [facility];  Service: General;  Laterality: N/A;      OB History     Gravida  7   Para  4   Term  3   Preterm  1   AB  2   Living  4      SAB  2   IAB      Ectopic       Multiple  0   Live Births  4               Current Medications: Current Meds  Medication Sig   aspirin EC 81 MG tablet Take 81 mg by mouth daily. Swallow whole.   labetalol (NORMODYNE) 200 MG tablet Take 0.5 tablets (100 mg total) by mouth 2 (two) times daily.   levothyroxine (SYNTHROID) 50 MCG tablet Take 50 mcg by mouth every Monday, Wednesday, and Friday.   NIFEdipine (PROCARDIA PO) Take 30 mg by mouth.   prenatal vitamin w/FE, FA (PRENATAL 1 + 1) 27-1 MG TABS Take 1 tablet by mouth daily.   promethazine (PHENERGAN) 25 MG tablet Take 25 mg by mouth as needed.     Allergies:   Cashew nut (anacardium occidentale) skin test   Social History   Socioeconomic History   Marital status: Married    Spouse name: Not on file  Number of children: Not on file   Years of education: Not on file   Highest education level: Not on file  Occupational History   Not on file  Tobacco Use   Smoking status: Never   Smokeless tobacco: Never  Vaping Use   Vaping status: Never Used  Substance and Sexual Activity   Alcohol use: No   Drug use: No   Sexual activity: Yes    Comment: pregnant  Other Topics Concern   Not on file  Social History Narrative   Not on file   Social Determinants of Health   Financial Resource Strain: Not on file  Food Insecurity: No Food Insecurity (04/13/2021)   Hunger Vital Sign    Worried About Running Out of Food in the Last Year: Never true    Ran Out of Food in the Last Year: Never true  Transportation Needs: Not on file  Physical Activity: Not on file  Stress: Not on file  Social Connections: Not on file      Family History  Problem Relation Age of Onset   Arthritis Brother    Diabetes Maternal Grandmother       ROS:   Please see the history of present illness.     All other systems reviewed and are negative.   Labs/EKG Reviewed:    EKG:   EKG was not ordered today.    Recent Labs: No results found for requested labs within last 365  days.   Recent Lipid Panel No results found for: "CHOL", "TRIG", "HDL", "CHOLHDL", "LDLCALC", "LDLDIRECT"  Physical Exam:    VS:  BP 118/88 (BP Location: Left Arm, Patient Position: Sitting, Cuff Size: Normal)   Pulse 98   Ht 5\' 5"  (1.651 m)   Wt 240 lb (108.9 kg)   LMP 02/12/2022   SpO2 100%   BMI 39.94 kg/m     Wt Readings from Last 3 Encounters:  10/01/22 240 lb (108.9 kg)  08/13/22 238 lb (108 kg)  06/03/22 235 lb (106.6 kg)     GEN:  Well nourished, well developed in no acute distress HEENT: Normal NECK: No JVD; No carotid bruits LYMPHATICS: No lymphadenopathy CARDIAC: RRR, 3/6 systolic murmurs, rubs, gallops RESPIRATORY:  Clear to auscultation without rales, wheezing or rhonchi  ABDOMEN: Soft, non-tender, non-distended MUSCULOSKELETAL:  No edema; No deformity  SKIN: Warm and dry NEUROLOGIC:  Alert and oriented x 3 PSYCHIATRIC:  Normal affect    Risk Assessment/Risk Calculators:     CARPREG II Risk Prediction Index Score:  1.  The patient's risk for a primary cardiac event is 5%.            ASSESSMENT & PLAN:    Chronic hypertension in pregnancy Obesity in pregnancy  Blood pressure is at target today, will continue her current dose of nifedipine 30 mg daily with labetalol 100 mg twice daily. Continue aspirin 81 mg for preeclampsia prophylaxis. Keep pregnancy weight gain between 11 to 20 pounds. She will need to be followed closely for her chronic hypertension in pregnancy. She was counseled by our cardio B pharmacist in the office today.  The patient is in agreement with the above plan. The patient left the office in stable condition.  The patient will follow up in 4 weeks or sooner if needed.  Patient Instructions  Medication Instructions:  Your physician recommends that you continue on your current medications as directed. Please refer to the Current Medication list given to you today.  *If you need a refill  on your cardiac medications before your  next appointment, please call your pharmacy*   Lab Work: None   Testing/Procedures: None   Follow-Up: At [facility], you and your health needs are our priority.  As part of our continuing mission to provide you with exceptional heart care, we have created designated Provider Care Teams.  These Care Teams include your primary Cardiologist (physician) and Advanced Practice Providers (APPs -  Physician Assistants and Nurse Practitioners) who all work together to provide you with the care you need, when you need it.  Your next appointment:   4 week(s)  Provider:   Thomasene Ripple, DO [address], [place], [place] [address]    Dispo:  No follow-ups on file.   Medication Adjustments/Labs and Tests Ordered: Current medicines are reviewed at length with the patient today.  Concerns regarding medicines are outlined above.  Tests Ordered: No orders of the defined types were placed in this encounter.  Medication Changes: No orders of the defined types were placed in this encounter.

## 2022-10-01 NOTE — Patient Instructions (Signed)
Medication Instructions:  Your physician recommends that you continue on your current medications as directed. Please refer to the Current Medication list given to you today.  *If you need a refill on your cardiac medications before your next appointment, please call your pharmacy*   Lab Work: None   Testing/Procedures: None   Follow-Up: At [facility], you and your health needs are our priority.  As part of our continuing mission to provide you with exceptional heart care, we have created designated Provider Care Teams.  These Care Teams include your primary Cardiologist (physician) and Advanced Practice Providers (APPs -  Physician Assistants and Nurse Practitioners) who all work together to provide you with the care you need, when you need it.  Your next appointment:   4 week(s)  Provider:   Thomasene Ripple, DO [address], [place], [place] [address]

## 2022-10-04 ENCOUNTER — Encounter (HOSPITAL_COMMUNITY): Payer: Self-pay | Admitting: Obstetrics and Gynecology

## 2022-10-04 ENCOUNTER — Inpatient Hospital Stay (HOSPITAL_COMMUNITY)
Admission: AD | Admit: 2022-10-04 | Discharge: 2022-10-04 | Disposition: A | Discharge status: Home / Self Care | Payer: BC Managed Care – PPO | Attending: Obstetrics and Gynecology | Admitting: Obstetrics and Gynecology

## 2022-10-04 DIAGNOSIS — O09213 Supervision of pregnancy with history of pre-term labor, third trimester: Secondary | ICD-10-CM | POA: Insufficient documentation

## 2022-10-04 DIAGNOSIS — Z3A33 33 weeks gestation of pregnancy: Secondary | ICD-10-CM | POA: Diagnosis not present

## 2022-10-04 DIAGNOSIS — O09893 Supervision of other high risk pregnancies, third trimester: Secondary | ICD-10-CM

## 2022-10-04 DIAGNOSIS — O479 False labor, unspecified: Secondary | ICD-10-CM

## 2022-10-04 DIAGNOSIS — Z0371 Encounter for suspected problem with amniotic cavity and membrane ruled out: Secondary | ICD-10-CM | POA: Insufficient documentation

## 2022-10-04 LAB — WET PREP, GENITAL
Clue Cells Wet Prep HPF POC: NONE SEEN
Sperm: NONE SEEN
Trich, Wet Prep: NONE SEEN
WBC, Wet Prep HPF POC: <10 (ref <10)
Yeast Wet Prep HPF POC: NONE SEEN

## 2022-10-04 LAB — RUPTURE OF MEMBRANE (ROM)PLUS: Rom Plus: NEGATIVE

## 2022-10-04 LAB — GROUP B STREP BY PCR: Group B strep by PCR: NEGATIVE

## 2022-10-04 NOTE — MAU Note (Signed)
Pt says at 630pm- went to b-room- underwear was wet.  Then at 830pm- new underwear was wet  Last sex- Sat night  Last baby del at 34 weeks  Feels UC's - more than 10 / hr. No pain  [facility]- Dr Timothy Lasso Also goes to MFM - all ok. Has an appointment tomorrow with MFM .

## 2022-10-04 NOTE — MAU Provider Note (Signed)
Concern for leakage of fluids     S Ms. Catherine Schroeder is a 37 y.o. [name] female at [redacted]w[redacted]d who presents to MAU today with complaint of LOF. Pt says at 630pm- went to b-room- underwear was wet. Then at 830pm- new underwear was wet.  Nothing in vagina for last 24 hrs but coitus reported 9/14. She feels CTX about 10 in an hour and was concerned given last pregnancy infant born at [redacted]wks gestation.  Denies VB, endorses +FM. Also goes to MFM - reports all appointments have been reassuring. Has an appointment tomorrow with MFM .   Receives care at [facility] (Dr. Timothy Lasso).   Pertinent items noted in HPI and remainder of comprehensive ROS otherwise negative.   O BP 131/77   Pulse 94   Temp 98.2 F (36.8 C) (Oral)   Resp 14   Ht 5' 5.5" (1.664 m)   Wt 110 kg   LMP 02/12/2022   BMI 39.72 kg/m  Physical Exam Constitutional:      General: She is not in acute distress.    Appearance: She is well-developed. She is not ill-appearing.  HENT:     Head: Normocephalic and atraumatic.     Mouth/Throat:     Mouth: Mucous membranes are moist.  Eyes:     Extraocular Movements: Extraocular movements intact.  Cardiovascular:     Rate and Rhythm: Regular rhythm. Tachycardia present.  Pulmonary:     Effort: Pulmonary effort is normal. No respiratory distress.  Abdominal:     Tenderness: There is no abdominal tenderness.     Comments: gravid  Genitourinary:    Vagina: Normal. No foreign body. No vaginal discharge or bleeding.     Cervix: Normal. No discharge.     Comments: Physiologic discharge, no apparent pooling or VB, swabs collected including ROM + Skin:    General: Skin is warm and dry.  Neurological:     General: No focal deficit present.     Mental Status: She is alert and oriented to person, place, and time.     Motor: No weakness.  Psychiatric:        Mood and Affect: Mood normal.        Behavior: Behavior normal.   NST: 130bpm, moderate variability, +accels, no decels. CTX  irregular 10-63mins   MDM: Moderate MAU Course:  Closed on pelvic exam w/o pooling or vaginal bleeding, neg ferning, neg ROM +.  CTX patient reports not feeling them.  Spoke with Dr. Charlotta Newton, stable for discharge, with MFM f/u tomorrow as previously scheduled, message to Dr. Timothy Lasso as well.  Given strict, usual return precautions.   A&P: #33 weeks #Hx of preterm delivery at 34 weeks,G6 -f/u with MFM 9/17 as previously scheduled   #R/o rupture: -ROM plus neg, neg pooling, neg fern   Discharge from MAU in stable condition with strict, usual precautions Follow up at MFM as scheduled for ongoing prenatal care  Allergies as of 10/04/2022       Reactions   Cashew Nut (anacardium Occidentale) Skin Test Hives        Medication List     TAKE these medications    aspirin EC 81 MG tablet Take 81 mg by mouth daily. Swallow whole.   folic acid 1 MG tablet Commonly known as: FOLVITE Take 1 mg by mouth daily.   labetalol 200 MG tablet Commonly known as: NORMODYNE Take 0.5 tablets (100 mg total) by mouth 2 (two) times daily.   levothyroxine 25 MCG tablet  Commonly known as: SYNTHROID Take 25 mcg by mouth 4 (four) times a week. Evern Bio, Thurs, and Sat   levothyroxine 50 MCG tablet Commonly known as: SYNTHROID Take 50 mcg by mouth every Monday, Wednesday, and Friday.   ondansetron 8 MG disintegrating tablet Commonly known as: ZOFRAN-ODT Take 8 mg by mouth as needed.   prenatal vitamin w/FE, FA 27-1 MG Tabs tablet Take 1 tablet by mouth daily.   PROCARDIA PO Take 30 mg by mouth.   promethazine 25 MG tablet Commonly known as: PHENERGAN Take 25 mg by mouth as needed.   ZOFRAN PO Take by mouth.        Hessie Dibble, MD 10/04/2022 11:35 PM

## 2022-10-05 ENCOUNTER — Ambulatory Visit: Payer: BC Managed Care – PPO | Attending: Obstetrics

## 2022-10-05 ENCOUNTER — Other Ambulatory Visit: Payer: Self-pay | Admitting: Obstetrics

## 2022-10-05 DIAGNOSIS — O10013 Pre-existing essential hypertension complicating pregnancy, third trimester: Secondary | ICD-10-CM | POA: Diagnosis not present

## 2022-10-05 DIAGNOSIS — O10913 Unspecified pre-existing hypertension complicating pregnancy, third trimester: Secondary | ICD-10-CM | POA: Insufficient documentation

## 2022-10-05 DIAGNOSIS — O99213 Obesity complicating pregnancy, third trimester: Secondary | ICD-10-CM | POA: Diagnosis not present

## 2022-10-05 DIAGNOSIS — E669 Obesity, unspecified: Secondary | ICD-10-CM

## 2022-10-05 DIAGNOSIS — Z3A33 33 weeks gestation of pregnancy: Secondary | ICD-10-CM

## 2022-10-05 DIAGNOSIS — O09213 Supervision of pregnancy with history of pre-term labor, third trimester: Secondary | ICD-10-CM

## 2022-10-05 DIAGNOSIS — O34219 Maternal care for unspecified type scar from previous cesarean delivery: Secondary | ICD-10-CM

## 2022-10-05 DIAGNOSIS — E039 Hypothyroidism, unspecified: Secondary | ICD-10-CM

## 2022-10-05 DIAGNOSIS — O43193 Other malformation of placenta, third trimester: Secondary | ICD-10-CM | POA: Insufficient documentation

## 2022-10-05 DIAGNOSIS — O09293 Supervision of pregnancy with other poor reproductive or obstetric history, third trimester: Secondary | ICD-10-CM

## 2022-10-05 DIAGNOSIS — O99283 Endocrine, nutritional and metabolic diseases complicating pregnancy, third trimester: Secondary | ICD-10-CM

## 2022-10-05 DIAGNOSIS — O09523 Supervision of elderly multigravida, third trimester: Secondary | ICD-10-CM

## 2022-10-05 LAB — GC/CHLAMYDIA PROBE AMP (~~LOC~~) NOT AT ARMC
Chlamydia: NEGATIVE
Comment: NEGATIVE
Comment: NORMAL
Neisseria Gonorrhea: NEGATIVE

## 2022-10-12 ENCOUNTER — Ambulatory Visit: Payer: BC Managed Care – PPO | Attending: Obstetrics

## 2022-10-12 ENCOUNTER — Other Ambulatory Visit: Payer: Self-pay | Admitting: *Deleted

## 2022-10-12 DIAGNOSIS — O10013 Pre-existing essential hypertension complicating pregnancy, third trimester: Secondary | ICD-10-CM | POA: Diagnosis not present

## 2022-10-12 DIAGNOSIS — E669 Obesity, unspecified: Secondary | ICD-10-CM | POA: Diagnosis not present

## 2022-10-12 DIAGNOSIS — O99213 Obesity complicating pregnancy, third trimester: Secondary | ICD-10-CM | POA: Diagnosis not present

## 2022-10-12 DIAGNOSIS — O09213 Supervision of pregnancy with history of pre-term labor, third trimester: Secondary | ICD-10-CM

## 2022-10-12 DIAGNOSIS — O99283 Endocrine, nutritional and metabolic diseases complicating pregnancy, third trimester: Secondary | ICD-10-CM

## 2022-10-12 DIAGNOSIS — O10919 Unspecified pre-existing hypertension complicating pregnancy, unspecified trimester: Secondary | ICD-10-CM

## 2022-10-12 DIAGNOSIS — O09523 Supervision of elderly multigravida, third trimester: Secondary | ICD-10-CM

## 2022-10-12 DIAGNOSIS — Z3A34 34 weeks gestation of pregnancy: Secondary | ICD-10-CM

## 2022-10-12 DIAGNOSIS — O10913 Unspecified pre-existing hypertension complicating pregnancy, third trimester: Secondary | ICD-10-CM | POA: Insufficient documentation

## 2022-10-12 DIAGNOSIS — Z369 Encounter for antenatal screening, unspecified: Secondary | ICD-10-CM | POA: Diagnosis not present

## 2022-10-12 DIAGNOSIS — O34219 Maternal care for unspecified type scar from previous cesarean delivery: Secondary | ICD-10-CM

## 2022-10-12 DIAGNOSIS — E039 Hypothyroidism, unspecified: Secondary | ICD-10-CM

## 2022-10-12 DIAGNOSIS — O43193 Other malformation of placenta, third trimester: Secondary | ICD-10-CM | POA: Insufficient documentation

## 2022-10-19 ENCOUNTER — Ambulatory Visit: Payer: BC Managed Care – PPO | Attending: Obstetrics

## 2022-10-19 DIAGNOSIS — O10913 Unspecified pre-existing hypertension complicating pregnancy, third trimester: Secondary | ICD-10-CM | POA: Diagnosis not present

## 2022-10-19 DIAGNOSIS — O09523 Supervision of elderly multigravida, third trimester: Secondary | ICD-10-CM

## 2022-10-19 DIAGNOSIS — O10013 Pre-existing essential hypertension complicating pregnancy, third trimester: Secondary | ICD-10-CM

## 2022-10-19 DIAGNOSIS — O99283 Endocrine, nutritional and metabolic diseases complicating pregnancy, third trimester: Secondary | ICD-10-CM

## 2022-10-19 DIAGNOSIS — E039 Hypothyroidism, unspecified: Secondary | ICD-10-CM

## 2022-10-19 DIAGNOSIS — O43193 Other malformation of placenta, third trimester: Secondary | ICD-10-CM | POA: Insufficient documentation

## 2022-10-19 DIAGNOSIS — Z3A35 35 weeks gestation of pregnancy: Secondary | ICD-10-CM

## 2022-10-19 DIAGNOSIS — O09293 Supervision of pregnancy with other poor reproductive or obstetric history, third trimester: Secondary | ICD-10-CM

## 2022-10-19 DIAGNOSIS — O99213 Obesity complicating pregnancy, third trimester: Secondary | ICD-10-CM | POA: Diagnosis not present

## 2022-10-19 DIAGNOSIS — E669 Obesity, unspecified: Secondary | ICD-10-CM

## 2022-10-19 DIAGNOSIS — O34219 Maternal care for unspecified type scar from previous cesarean delivery: Secondary | ICD-10-CM

## 2022-10-22 DIAGNOSIS — Z369 Encounter for antenatal screening, unspecified: Secondary | ICD-10-CM | POA: Diagnosis not present

## 2022-10-27 ENCOUNTER — Encounter: Payer: Self-pay | Admitting: Cardiology

## 2022-10-27 ENCOUNTER — Ambulatory Visit (INDEPENDENT_AMBULATORY_CARE_PROVIDER_SITE_OTHER): Payer: BC Managed Care – PPO | Admitting: Cardiology

## 2022-10-27 ENCOUNTER — Ambulatory Visit: Payer: BC Managed Care – PPO | Attending: Obstetrics and Gynecology | Admitting: *Deleted

## 2022-10-27 VITALS — BP 132/80

## 2022-10-27 VITALS — BP 132/80 | HR 92 | Ht 65.0 in | Wt 247.6 lb

## 2022-10-27 DIAGNOSIS — O24410 Gestational diabetes mellitus in pregnancy, diet controlled: Secondary | ICD-10-CM | POA: Diagnosis not present

## 2022-10-27 DIAGNOSIS — O99413 Diseases of the circulatory system complicating pregnancy, third trimester: Secondary | ICD-10-CM | POA: Diagnosis not present

## 2022-10-27 DIAGNOSIS — E669 Obesity, unspecified: Secondary | ICD-10-CM

## 2022-10-27 DIAGNOSIS — O10919 Unspecified pre-existing hypertension complicating pregnancy, unspecified trimester: Secondary | ICD-10-CM | POA: Diagnosis not present

## 2022-10-27 DIAGNOSIS — O10913 Unspecified pre-existing hypertension complicating pregnancy, third trimester: Secondary | ICD-10-CM

## 2022-10-27 DIAGNOSIS — O09523 Supervision of elderly multigravida, third trimester: Secondary | ICD-10-CM

## 2022-10-27 DIAGNOSIS — Z3A36 36 weeks gestation of pregnancy: Secondary | ICD-10-CM

## 2022-10-27 DIAGNOSIS — Z3A37 37 weeks gestation of pregnancy: Secondary | ICD-10-CM

## 2022-10-27 DIAGNOSIS — O0993 Supervision of high risk pregnancy, unspecified, third trimester: Secondary | ICD-10-CM

## 2022-10-27 DIAGNOSIS — O99213 Obesity complicating pregnancy, third trimester: Secondary | ICD-10-CM | POA: Insufficient documentation

## 2022-10-27 DIAGNOSIS — Z3480 Encounter for supervision of other normal pregnancy, unspecified trimester: Secondary | ICD-10-CM | POA: Diagnosis not present

## 2022-10-27 DIAGNOSIS — O163 Unspecified maternal hypertension, third trimester: Secondary | ICD-10-CM | POA: Diagnosis not present

## 2022-10-27 DIAGNOSIS — I4519 Other right bundle-branch block: Secondary | ICD-10-CM | POA: Insufficient documentation

## 2022-10-27 DIAGNOSIS — O10013 Pre-existing essential hypertension complicating pregnancy, third trimester: Secondary | ICD-10-CM | POA: Diagnosis not present

## 2022-10-27 NOTE — Progress Notes (Unsigned)
Cardio-Obstetrics Clinic  {Choose New Eval or Follow Up Note:[phone]}   Prior CV Studies Reviewed: The following studies were reviewed today: ***  Past Medical History:  Diagnosis Date   GERD (gastroesophageal reflux disease) 08/02/2010   Gestational diabetes mellitus (GDM), antepartum    diet controlled   Gestational hypertension    Headache    History of COVID-19 2021   mild all symptoms resolved   Hypothyroidism    during pregnancy only   Incisional hernia 11/12/2013   Normal vaginal delivery 06/11/2015   Postoperative state 12/21/2017   ROM (rupture of membranes), premature 06/11/2015   UTI (urinary tract infection)     Past Surgical History:  Procedure Laterality Date   CESAREAN SECTION N/A 12/21/2017   Procedure: CESAREAN SECTION;  Surgeon: Carrington Clamp, MD;  Location: [facility] BIRTHING SUITES;  Service: Obstetrics;  Laterality: N/A;   CHOLECYSTECTOMY  01/09/2011   Procedure: LAPAROSCOPIC CHOLECYSTECTOMY WITH INTRAOPERATIVE CHOLANGIOGRAM;  Surgeon: Ernestene Mention, MD;  Location: [facility];  Service: General;  Laterality: N/A;   DILATION AND EVACUATION N/A 04/23/2021   Procedure: SUCTION DILATATION AND EVACUATION AND CHROMOSME STUDIES;  Surgeon: Carrington Clamp, MD;  Location: [facility];  Service: Gynecology;  Laterality: N/A;  urgent suction d&c   DILATION AND EVACUATION N/A 05/06/2021   Procedure: SUCTION DILATATION AND EVACUATION;  Surgeon: Carrington Clamp, MD;  Location: [facility];  Service: Gynecology;  Laterality: N/A;  ANORA KIT NOT NEEDED   ERCP  01/08/2011   Procedure: ENDOSCOPIC RETROGRADE CHOLANGIOPANCREATOGRAPHY (ERCP);  Surgeon: Rob Bunting, MD;  Location: [facility];  Service: Endoscopy;  Laterality: N/A;   EUS  01/08/2011   Procedure: UPPER ENDOSCOPIC ULTRASOUND (EUS) RADIAL;  Surgeon: Rob Bunting, MD;  Location: [facility];  Service: Endoscopy;  Laterality: N/A;  Wants to look with radial EUS scope prior to ERCP   HERNIA REPAIR      above umbilicus   INSERTION OF MESH N/A 11/12/2013   Procedure: INSERTION OF MESH;  Surgeon: Claud Kelp, MD;  Location: [facility];  Service: General;  Laterality: N/A;   VENTRAL HERNIA REPAIR N/A 11/12/2013   Procedure: LAPAROSCOPIC MULTIPLE  VENTRAL HERNIAS POSSIBLE OPEN;  Surgeon: Claud Kelp, MD;  Location: [facility];  Service: General;  Laterality: N/A;   { Click here to update PMH, PSH, OB Hx then refresh note  :1}   OB History     Gravida  7   Para  4   Term  3   Preterm  1   AB  2   Living  4      SAB  2   IAB      Ectopic      Multiple  0   Live Births  4           { Click here to update OB Charting then refresh note  :1}    Current Medications: Current Meds  Medication Sig   aspirin EC 81 MG tablet Take 81 mg by mouth daily. Swallow whole.   folic acid (FOLVITE) 1 MG tablet Take 1 mg by mouth daily.   labetalol (NORMODYNE) 200 MG tablet Take 0.5 tablets (100 mg total) by mouth 2 (two) times daily.   levothyroxine (SYNTHROID) 50 MCG tablet Take 50 mcg by mouth every Monday, Wednesday, and Friday.   levothyroxine (SYNTHROID, LEVOTHROID) 25 MCG tablet Take 25 mcg by mouth 4 (four) times a week. Evern Bio, Thurs, and Sat   NIFEdipine (PROCARDIA PO) Take 30 mg by mouth.  ondansetron (ZOFRAN-ODT) 8 MG disintegrating tablet Take 8 mg by mouth as needed.   Ondansetron HCl (ZOFRAN PO) Take by mouth.   prenatal vitamin w/FE, FA (PRENATAL 1 + 1) 27-1 MG TABS Take 1 tablet by mouth daily.   promethazine (PHENERGAN) 25 MG tablet Take 25 mg by mouth as needed.     Allergies:   Cashew nut (anacardium occidentale) skin test   Social History   Socioeconomic History   Marital status: Married    Spouse name: Not on file   Number of children: Not on file   Years of education: Not on file   Highest education level: Not on file  Occupational History   Not on file  Tobacco Use   Smoking status: Never   Smokeless tobacco: Never  Vaping Use   Vaping  status: Never Used  Substance and Sexual Activity   Alcohol use: No   Drug use: No   Sexual activity: Yes    Comment: pregnant  Other Topics Concern   Not on file  Social History Narrative   Not on file   Social Determinants of Health   Financial Resource Strain: Not on file  Food Insecurity: No Food Insecurity (04/13/2021)   Hunger Vital Sign    Worried About Running Out of Food in the Last Year: Never true    Ran Out of Food in the Last Year: Never true  Transportation Needs: Not on file  Physical Activity: Not on file  Stress: Not on file  Social Connections: Not on file  { Click here to update SDOH then refresh :1}    Family History  Problem Relation Age of Onset   Arthritis Brother    Diabetes Maternal Grandmother    { Click here to update FH then refresh note    :1}   ROS:   Please see the history of present illness.    *** All other systems reviewed and are negative.   Labs/EKG Reviewed:    EKG:   EKG is *** ordered today.  The ekg ordered today demonstrates ***  Recent Labs: No results found for requested labs within last 365 days.   Recent Lipid Panel No results found for: "CHOL", "TRIG", "HDL", "CHOLHDL", "LDLCALC", "LDLDIRECT"  Physical Exam:    VS:  BP 132/80   Pulse 92   Ht 5\' 5"  (1.651 m)   Wt 247 lb 9.6 oz (112.3 kg)   LMP 02/12/2022   SpO2 97%   BMI 41.20 kg/m     Wt Readings from Last 3 Encounters:  10/27/22 247 lb 9.6 oz (112.3 kg)  10/04/22 242 lb 6.4 oz (110 kg)  10/01/22 240 lb (108.9 kg)     GEN: *** Well nourished, well developed in no acute distress HEENT: Normal NECK: No JVD; No carotid bruits LYMPHATICS: No lymphadenopathy CARDIAC: ***RRR, no murmurs, rubs, gallops RESPIRATORY:  Clear to auscultation without rales, wheezing or rhonchi  ABDOMEN: Soft, non-tender, non-distended MUSCULOSKELETAL:  No edema; No deformity  SKIN: Warm and dry NEUROLOGIC:  Alert and oriented x 3 PSYCHIATRIC:  Normal affect    Risk  Assessment/Risk Calculators:   { Click to calculate CARPREG II - THEN refresh note :1}    { Click to caclulate Mod WHO Class of CV Risk - THEN refresh note :1}     { Click for CHADS2VASc Score - THEN Refresh Note    :[id number]}      ASSESSMENT & PLAN:    *** Patient Instructions  Medication Instructions:  Your physician recommends that you continue on your current medications as directed. Please refer to the Current Medication list given to you today.  *If you need a refill on your cardiac medications before your next appointment, please call your pharmacy*   Lab Work: None   Testing/Procedures: None   Follow-Up: At [facility], you and your health needs are our priority.  As part of our continuing mission to provide you with exceptional heart care, we have created designated Provider Care Teams.  These Care Teams include your primary Cardiologist (physician) and Advanced Practice Providers (APPs -  Physician Assistants and Nurse Practitioners) who all work together to provide you with the care you need, when you need it.    Your next appointment:   8 week(s)  Provider:   Thomasene Ripple, DO      Dispo:  No follow-ups on file.   Medication Adjustments/Labs and Tests Ordered: Current medicines are reviewed at length with the patient today.  Concerns regarding medicines are outlined above.  Tests Ordered: No orders of the defined types were placed in this encounter.  Medication Changes: No orders of the defined types were placed in this encounter.

## 2022-10-27 NOTE — Patient Instructions (Signed)
Medication Instructions:  Your physician recommends that you continue on your current medications as directed. Please refer to the Current Medication list given to you today.  *If you need a refill on your cardiac medications before your next appointment, please call your pharmacy*   Lab Work: None   Testing/Procedures: None   Follow-Up: At [facility], you and your health needs are our priority.  As part of our continuing mission to provide you with exceptional heart care, we have created designated Provider Care Teams.  These Care Teams include your primary Cardiologist (physician) and Advanced Practice Providers (APPs -  Physician Assistants and Nurse Practitioners) who all work together to provide you with the care you need, when you need it.  Your next appointment:   8 week(s)  Provider:   Berniece Salines, DO

## 2022-10-27 NOTE — Procedures (Addendum)
Catherine Schroeder 12-25-1985 [redacted]w[redacted]d  Fetus A Non-Stress Test Interpretation for 10/27/22-NST only  Indication: Chronic Hypertenstion, Advanced Maternal Age >40 years, and , Obese, MCI  Fetal Heart Rate A Mode: External Baseline Rate (A): 140 bpm Variability: Moderate Accelerations: 15 x 15 Decelerations: None Multiple birth?: No  Uterine Activity Mode: Toco Contraction Frequency (min): occas Contraction Duration (sec): 60-130 Contraction Quality: Mild Resting Tone Palpated: Relaxed  Interpretation (Fetal Testing) Nonstress Test Interpretation: Reactive Comments: Tracing reviewed bydr. Darra Lis

## 2022-10-28 ENCOUNTER — Telehealth (HOSPITAL_COMMUNITY): Payer: Self-pay | Admitting: *Deleted

## 2022-10-28 ENCOUNTER — Encounter (HOSPITAL_COMMUNITY): Payer: Self-pay | Admitting: *Deleted

## 2022-10-28 NOTE — Telephone Encounter (Signed)
Preadmission screen  

## 2022-11-01 DIAGNOSIS — Z23 Encounter for immunization: Secondary | ICD-10-CM | POA: Diagnosis not present

## 2022-11-02 ENCOUNTER — Inpatient Hospital Stay (HOSPITAL_COMMUNITY): Payer: BC Managed Care – PPO | Admitting: Anesthesiology

## 2022-11-02 ENCOUNTER — Inpatient Hospital Stay (HOSPITAL_COMMUNITY)
Admission: RE | Admit: 2022-11-02 | Discharge: 2022-11-05 | DRG: 807 | Disposition: A | Discharge status: Home / Self Care | Payer: BC Managed Care – PPO | Attending: Obstetrics and Gynecology | Admitting: Obstetrics and Gynecology

## 2022-11-02 ENCOUNTER — Other Ambulatory Visit: Payer: Self-pay

## 2022-11-02 ENCOUNTER — Encounter (HOSPITAL_COMMUNITY): Payer: Self-pay | Admitting: Obstetrics and Gynecology

## 2022-11-02 ENCOUNTER — Ambulatory Visit: Payer: BC Managed Care – PPO

## 2022-11-02 ENCOUNTER — Inpatient Hospital Stay (HOSPITAL_COMMUNITY): Payer: BC Managed Care – PPO

## 2022-11-02 DIAGNOSIS — Z23 Encounter for immunization: Secondary | ICD-10-CM | POA: Diagnosis not present

## 2022-11-02 DIAGNOSIS — K219 Gastro-esophageal reflux disease without esophagitis: Secondary | ICD-10-CM | POA: Diagnosis not present

## 2022-11-02 DIAGNOSIS — O99284 Endocrine, nutritional and metabolic diseases complicating childbirth: Secondary | ICD-10-CM | POA: Diagnosis present

## 2022-11-02 DIAGNOSIS — O34219 Maternal care for unspecified type scar from previous cesarean delivery: Secondary | ICD-10-CM

## 2022-11-02 DIAGNOSIS — Z412 Encounter for routine and ritual male circumcision: Secondary | ICD-10-CM | POA: Diagnosis not present

## 2022-11-02 DIAGNOSIS — Z833 Family history of diabetes mellitus: Secondary | ICD-10-CM

## 2022-11-02 DIAGNOSIS — O9962 Diseases of the digestive system complicating childbirth: Secondary | ICD-10-CM | POA: Diagnosis present

## 2022-11-02 DIAGNOSIS — Z8632 Personal history of gestational diabetes: Secondary | ICD-10-CM | POA: Diagnosis not present

## 2022-11-02 DIAGNOSIS — O99214 Obesity complicating childbirth: Secondary | ICD-10-CM | POA: Diagnosis present

## 2022-11-02 DIAGNOSIS — O1092 Unspecified pre-existing hypertension complicating childbirth: Secondary | ICD-10-CM | POA: Diagnosis not present

## 2022-11-02 DIAGNOSIS — E039 Hypothyroidism, unspecified: Secondary | ICD-10-CM | POA: Diagnosis not present

## 2022-11-02 DIAGNOSIS — R011 Cardiac murmur, unspecified: Secondary | ICD-10-CM | POA: Diagnosis not present

## 2022-11-02 DIAGNOSIS — O34211 Maternal care for low transverse scar from previous cesarean delivery: Secondary | ICD-10-CM | POA: Diagnosis present

## 2022-11-02 DIAGNOSIS — Z3A37 37 weeks gestation of pregnancy: Secondary | ICD-10-CM | POA: Diagnosis not present

## 2022-11-02 DIAGNOSIS — Z8616 Personal history of COVID-19: Secondary | ICD-10-CM | POA: Diagnosis not present

## 2022-11-02 DIAGNOSIS — Z3490 Encounter for supervision of normal pregnancy, unspecified, unspecified trimester: Principal | ICD-10-CM

## 2022-11-02 DIAGNOSIS — O164 Unspecified maternal hypertension, complicating childbirth: Secondary | ICD-10-CM | POA: Diagnosis not present

## 2022-11-02 LAB — TYPE AND SCREEN
ABO/RH(D): O POS
Antibody Screen: NEGATIVE

## 2022-11-02 LAB — CBC
HCT: 36.8 % (ref 36.0–46.0)
HCT: 37 % (ref 36.0–46.0)
Hemoglobin: 12.5 g/dL (ref 12.0–15.0)
Hemoglobin: 12.8 g/dL (ref 12.0–15.0)
MCH: 30 pg (ref 26.0–34.0)
MCH: 30.3 pg (ref 26.0–34.0)
MCHC: 34 g/dL (ref 30.0–36.0)
MCHC: 34.6 g/dL (ref 30.0–36.0)
MCV: 88.2 fL (ref 80.0–100.0)
MCV: 87.7 fL (ref 80.0–100.0)
Platelets: 226 10*3/uL (ref 150–400)
Platelets: 220 10*3/uL (ref 150–400)
RBC: 4.17 MIL/uL (ref 3.87–5.11)
RBC: 4.22 MIL/uL (ref 3.87–5.11)
RDW: 14.6 % (ref 11.5–15.5)
RDW: 14.6 % (ref 11.5–15.5)
WBC: 10.3 10*3/uL (ref 4.0–10.5)
WBC: 10.8 10*3/uL — ABNORMAL HIGH (ref 4.0–10.5)
nRBC: 0 % (ref 0.0–0.2)
nRBC: 0 % (ref 0.0–0.2)

## 2022-11-02 LAB — COMPREHENSIVE METABOLIC PANEL
ALT: 13 U/L (ref 0–44)
AST: 22 U/L (ref 15–41)
Albumin: 2.8 g/dL — ABNORMAL LOW (ref 3.5–5.0)
Alkaline Phosphatase: 102 U/L (ref 38–126)
Anion gap: 10 (ref 5–15)
BUN: <5 mg/dL — ABNORMAL LOW (ref 6–20)
CO2: 21 mmol/L — ABNORMAL LOW (ref 22–32)
Calcium: 9.5 mg/dL (ref 8.9–10.3)
Chloride: 105 mmol/L (ref 98–111)
Creatinine, Ser: 0.65 mg/dL (ref 0.44–1.00)
GFR, Estimated: >60 mL/min (ref >60)
Glucose, Bld: 85 mg/dL (ref 70–99)
Potassium: 3.4 mmol/L — ABNORMAL LOW (ref 3.5–5.1)
Sodium: 136 mmol/L (ref 135–145)
Total Bilirubin: 0.8 mg/dL (ref 0.3–1.2)
Total Protein: 6.2 g/dL — ABNORMAL LOW (ref 6.5–8.1)

## 2022-11-02 LAB — PROTEIN / CREATININE RATIO, URINE
Creatinine, Urine: 169 mg/dL
Protein Creatinine Ratio: 0.13 mg/mg{creat} (ref 0.00–0.15)
Total Protein, Urine: 22 mg/dL

## 2022-11-02 LAB — RPR: RPR Ser Ql: NONREACTIVE

## 2022-11-02 MED ORDER — ACETAMINOPHEN 325 MG PO TABS: 650.0000 mg | ORAL_TABLET | ORAL | Status: DC | PRN

## 2022-11-02 MED ORDER — LACTATED RINGERS IV SOLN
500.0000 mL | INTRAVENOUS | Status: DC | PRN
  Administered 2022-11-02: 250 mL via INTRAVENOUS

## 2022-11-02 MED ORDER — OXYTOCIN-SODIUM CHLORIDE 30-0.9 UT/500ML-% IV SOLN
1.0000 m[IU]/min | INTRAVENOUS | Status: DC
  Administered 2022-11-02: 8 m[IU]/min via INTRAVENOUS
  Administered 2022-11-02: 1 m[IU]/min via INTRAVENOUS

## 2022-11-02 MED ORDER — LIDOCAINE HCL (PF) 1 % IJ SOLN: 30.0000 mL | INTRAMUSCULAR | Status: DC | PRN

## 2022-11-02 MED ORDER — OXYCODONE-ACETAMINOPHEN 5-325 MG PO TABS: 2.0000 | ORAL_TABLET | ORAL | Status: DC | PRN

## 2022-11-02 MED ORDER — FLEET ENEMA RE ENEM: 1.0000 | ENEMA | Freq: Every day | RECTAL | Status: DC | PRN

## 2022-11-02 MED ORDER — FENTANYL-BUPIVACAINE-NACL 0.5-0.125-0.9 MG/250ML-% EP SOLN
12.0000 mL/h | EPIDURAL | Status: DC | PRN
  Administered 2022-11-02: 12 mL/h via EPIDURAL
  Filled 2022-11-02: qty 250

## 2022-11-02 MED ORDER — SOD CITRATE-CITRIC ACID 500-334 MG/5ML PO SOLN: 30.0000 mL | ORAL | Status: DC | PRN

## 2022-11-02 MED ORDER — DIPHENHYDRAMINE HCL 50 MG/ML IJ SOLN: 12.5000 mg | INTRAMUSCULAR | Status: DC | PRN

## 2022-11-02 MED ORDER — EPHEDRINE 5 MG/ML INJ: 10.0000 mg | INTRAVENOUS | Status: DC | PRN

## 2022-11-02 MED ORDER — ONDANSETRON HCL 4 MG/2ML IJ SOLN: 4.0000 mg | Freq: Four times a day (QID) | INTRAMUSCULAR | Status: DC | PRN

## 2022-11-02 MED ORDER — HYDRALAZINE HCL 20 MG/ML IJ SOLN: 10.0000 mg | INTRAMUSCULAR | Status: DC | PRN

## 2022-11-02 MED ORDER — OXYTOCIN-SODIUM CHLORIDE 30-0.9 UT/500ML-% IV SOLN
2.5000 [IU]/h | INTRAVENOUS | Status: DC
  Administered 2022-11-03: 2.5 [IU]/h via INTRAVENOUS

## 2022-11-02 MED ORDER — OXYTOCIN-SODIUM CHLORIDE 30-0.9 UT/500ML-% IV SOLN
1.0000 m[IU]/min | INTRAVENOUS | Status: DC
  Filled 2022-11-02: qty 500

## 2022-11-02 MED ORDER — LACTATED RINGERS IV SOLN: 500.0000 mL | Freq: Once | INTRAVENOUS | Status: DC

## 2022-11-02 MED ORDER — OXYCODONE-ACETAMINOPHEN 5-325 MG PO TABS: 1.0000 | ORAL_TABLET | ORAL | Status: DC | PRN

## 2022-11-02 MED ORDER — OXYTOCIN BOLUS FROM INFUSION
333.0000 mL | Freq: Once | INTRAVENOUS | Status: AC
  Administered 2022-11-03: 333 mL via INTRAVENOUS

## 2022-11-02 MED ORDER — PHENYLEPHRINE 80 MCG/ML (10ML) SYRINGE FOR IV PUSH (FOR BLOOD PRESSURE SUPPORT)
80.0000 ug | PREFILLED_SYRINGE | INTRAVENOUS | Status: DC | PRN
  Administered 2022-11-03 (×2): 80 ug via INTRAVENOUS

## 2022-11-02 MED ORDER — LACTATED RINGERS IV SOLN: INTRAVENOUS | Status: DC

## 2022-11-02 MED ORDER — PHENYLEPHRINE 80 MCG/ML (10ML) SYRINGE FOR IV PUSH (FOR BLOOD PRESSURE SUPPORT)
80.0000 ug | PREFILLED_SYRINGE | INTRAVENOUS | Status: DC | PRN
  Filled 2022-11-02: qty 10

## 2022-11-02 MED ORDER — LACTATED RINGERS AMNIOINFUSION: INTRAVENOUS | Status: DC

## 2022-11-02 MED ORDER — LABETALOL HCL 5 MG/ML IV SOLN: 80.0000 mg | INTRAVENOUS | Status: DC | PRN

## 2022-11-02 MED ORDER — TERBUTALINE SULFATE 1 MG/ML IJ SOLN: 0.2500 mg | Freq: Once | INTRAMUSCULAR | Status: DC | PRN

## 2022-11-02 MED ORDER — LABETALOL HCL 5 MG/ML IV SOLN: 40.0000 mg | INTRAVENOUS | Status: DC | PRN

## 2022-11-02 MED ORDER — LABETALOL HCL 5 MG/ML IV SOLN: 20.0000 mg | INTRAVENOUS | Status: DC | PRN

## 2022-11-02 MED ORDER — LIDOCAINE HCL (PF) 1 % IJ SOLN
INTRAMUSCULAR | Status: DC | PRN
  Administered 2022-11-02: 6 mL via EPIDURAL
  Administered 2022-11-02: 2 mL via EPIDURAL

## 2022-11-02 NOTE — Progress Notes (Signed)
Comfortable with epidural, bleeding improved Afeb, VSS FHT-140, min-mod variability, variable decels with ctx, + scalp stim, Cat 2.  She had a prolonged decel after epidural placed and pitocin turned off, recently restarted at 8 mu/min but now with deep variables so turned off again.  Ctx were q 4 min before pitocin restarted VE-8/90/-1, vtx, IUPC inserted  Will try amnioinfusion, monitor closely, pitocin off for now

## 2022-11-02 NOTE — Progress Notes (Signed)
Feeling some ctx, had SROM around 1440 Afeb, VSS, BP 130-240/70-90 FHT-130, Cat 1, ctx q 3 min VE-3/50/-2, vtx, clear fluid Continue pitocin, monitor progress, anticipate VBAC, discussed epidural

## 2022-11-02 NOTE — Anesthesia Procedure Notes (Signed)
Epidural Patient location during procedure: OB Start time: 11/02/2022 10:04 PM End time: 11/02/2022 10:11 PM  Staffing Anesthesiologist: Marcene Duos, MD Performed: anesthesiologist   Preanesthetic Checklist Completed: patient identified, IV checked, site marked, risks and benefits discussed, surgical consent, monitors and equipment checked, pre-op evaluation and timeout performed  Epidural Patient position: sitting Prep: DuraPrep and site prepped and draped Patient monitoring: continuous pulse ox and blood pressure Approach: midline Location: L4-L5 Injection technique: LOR air  Needle:  Needle type: Tuohy  Needle gauge: 17 G Needle length: 9 cm and 9 Needle insertion depth: 7.5 cm Catheter type: closed end flexible Catheter size: 19 Gauge Catheter at skin depth: 13 cm Test dose: negative  Assessment Events: blood not aspirated, no cerebrospinal fluid, injection not painful, no injection resistance, no paresthesia and negative IV test

## 2022-11-02 NOTE — Anesthesia Preprocedure Evaluation (Signed)
Anesthesia Evaluation  Patient identified by MRN, date of birth, ID band Patient awake    Reviewed: Allergy & Precautions, Patient's Chart, lab work & pertinent test results  Airway Mallampati: III  TM Distance: >3 FB Neck ROM: Full    Dental   Pulmonary neg pulmonary ROS   breath sounds clear to auscultation       Cardiovascular hypertension,  Rhythm:Regular Rate:Normal     Neuro/Psych  Headaches    GI/Hepatic Neg liver ROS,GERD  ,,  Endo/Other  diabetesHypothyroidism    Renal/GU negative Renal ROS     Musculoskeletal   Abdominal   Peds  Hematology negative hematology ROS (+)   Anesthesia Other Findings   Reproductive/Obstetrics (+) Pregnancy                             Anesthesia Physical Anesthesia Plan  ASA: 3  Anesthesia Plan: Epidural   Post-op Pain Management:    Induction:   PONV Risk Score and Plan: 2 and Treatment may vary due to age or medical condition  Airway Management Planned: Natural Airway  Additional Equipment:   Intra-op Plan:   Post-operative Plan:   Informed Consent: I have reviewed the patients History and Physical, chart, labs and discussed the procedure including the risks, benefits and alternatives for the proposed anesthesia with the patient or authorized representative who has indicated his/her understanding and acceptance.       Plan Discussed with:   Anesthesia Plan Comments:        Anesthesia Quick Evaluation

## 2022-11-02 NOTE — Progress Notes (Signed)
Ctx are stronger, having some bleeding Afeb, VSS FHT-130-140, min-mod variability, + scalp stim, some early and rare variable decel, Cat 2, ctx q 2-3 min on 10 mu/min pitocin VE-4/50/-2, vtx, min-mod blood, possible clot or small piece of placenta at 10:00  Making progress, mild-mod vaginal bleeding.  I have reviewed her u/s reports, no mention of placenta previa or succenturiate lobe.  Could be small abruption, if so, baby tolerating for now.  Could be bleeding from uterine scar, again baby tolerating for now and has not lost station, pain tolerable.  Will continue current pitocin and monitor closely, hopefully in active labor soon

## 2022-11-02 NOTE — H&P (Signed)
Catherine Schroeder is a 37 y.o. female, G7 P3124, EGA 37+ weeks with EDC 11-1 presenting for induction for CHTN.  PNC complicated by [facility], controlled with Labetalol 300 mg bid and procardia XL 30 mg daily. Previous LTCS, 3 previous SVD, CTOL and desires VBAC.  AMA with low risk NIPT, hypothyroidism on synthroid, obesity with BMI 40.  OB History     Gravida  7   Para  4   Term  3   Preterm  1   AB  2   Living  4      SAB  2   IAB      Ectopic      Multiple  0   Live Births  4          Past Medical History:  Diagnosis Date   GERD (gastroesophageal reflux disease) 08/02/2010   Gestational diabetes mellitus (GDM), antepartum    diet controlled   Gestational hypertension    Headache    History of COVID-19 2021   mild all symptoms resolved   Hypothyroidism    during pregnancy only   Incisional hernia 11/12/2013   Normal vaginal delivery 06/11/2015   Postoperative state 12/21/2017   ROM (rupture of membranes), premature 06/11/2015   UTI (urinary tract infection)    Past Surgical History:  Procedure Laterality Date   CESAREAN SECTION N/A 12/21/2017   Procedure: CESAREAN SECTION;  Surgeon: Carrington Clamp, MD;  Location: [facility] BIRTHING SUITES;  Service: Obstetrics;  Laterality: N/A;   CHOLECYSTECTOMY  01/09/2011   Procedure: LAPAROSCOPIC CHOLECYSTECTOMY WITH INTRAOPERATIVE CHOLANGIOGRAM;  Surgeon: Ernestene Mention, MD;  Location: [facility] ORS;  Service: General;  Laterality: N/A;   DILATION AND EVACUATION N/A 04/23/2021   Procedure: SUCTION DILATATION AND EVACUATION AND CHROMOSME STUDIES;  Surgeon: Carrington Clamp, MD;  Location: [facility] [place];  Service: Gynecology;  Laterality: N/A;  urgent suction d&c   DILATION AND EVACUATION N/A 05/06/2021   Procedure: SUCTION DILATATION AND EVACUATION;  Surgeon: Carrington Clamp, MD;  Location: [facility] OR;  Service: Gynecology;  Laterality: N/A;  ANORA KIT NOT NEEDED   ERCP  01/08/2011   Procedure: ENDOSCOPIC RETROGRADE  CHOLANGIOPANCREATOGRAPHY (ERCP);  Surgeon: Rob Bunting, MD;  Location: [facility];  Service: Endoscopy;  Laterality: N/A;   EUS  01/08/2011   Procedure: UPPER ENDOSCOPIC ULTRASOUND (EUS) RADIAL;  Surgeon: Rob Bunting, MD;  Location: [facility] ENDOSCOPY;  Service: Endoscopy;  Laterality: N/A;  Wants to look with radial EUS scope prior to ERCP   HERNIA REPAIR     above umbilicus   INSERTION OF MESH N/A 11/12/2013   Procedure: INSERTION OF MESH;  Surgeon: Claud Kelp, MD;  Location: [facility] ORS;  Service: General;  Laterality: N/A;   VENTRAL HERNIA REPAIR N/A 11/12/2013   Procedure: LAPAROSCOPIC MULTIPLE  VENTRAL HERNIAS POSSIBLE OPEN;  Surgeon: Claud Kelp, MD;  Location: [facility] ORS;  Service: General;  Laterality: N/A;   Family History: family history includes Diabetes in her maternal grandmother. Social History:  reports that she has never smoked. She has never used smokeless tobacco. She reports that she does not drink alcohol and does not use drugs.     Maternal Diabetes: No Genetic Screening: Normal Maternal Ultrasounds/Referrals: Normal Fetal Ultrasounds or other Referrals:  None Maternal Substance Abuse:  No Significant Maternal Medications:  Meds include: Syntroid Other:  Significant Maternal Lab Results:  Group B Strep negative Number of Prenatal Visits:greater than 3 verified prenatal visits Maternal Vaccinations:TDap Other Comments:   Labetalol and Procardia XL  Review of  Systems  Respiratory: Negative.    Cardiovascular: Negative.    Maternal Medical History:  Fetal activity: Perceived fetal activity is normal.   Prenatal complications: PIH.   Prenatal Complications - Diabetes: none.   Dilation: 2 Station: Ballotable Exam by:: Dr. Jackelyn Knife Blood pressure (!) 133/91, pulse 87, temperature 98.1 F (36.7 C), temperature source Oral, resp. rate 18, height 5\' 5"  (1.651 m), weight 110.3 kg, last menstrual period 02/12/2022, unknown if currently breastfeeding. Maternal Exam:   Uterine Assessment: Contraction strength is mild.  Contraction frequency is irregular.  Abdomen: Patient reports no abdominal tenderness. Surgical scars: low transverse.   Estimated fetal weight is 7 lbs.   Fetal presentation: vertex Introitus: Normal vulva. Normal vagina.  Amniotic fluid character: not assessed. Pelvis: adequate for delivery.     Fetal Exam Fetal Monitor Review: Mode: ultrasound.   Baseline rate: 130.  Variability: moderate (6-25 bpm).   Pattern: accelerations present and no decelerations.   Fetal State Assessment: Category I - tracings are normal.   Physical Exam Vitals reviewed.  Constitutional:      Appearance: Normal appearance.  Cardiovascular:     Rate and Rhythm: Normal rate and regular rhythm.  Pulmonary:     Effort: Pulmonary effort is normal. No respiratory distress.  Abdominal:     Palpations: Abdomen is soft.  Genitourinary:    General: Normal vulva.  Neurological:     Mental Status: She is alert.     Prenatal labs: ABO, Rh: --/--/PENDING (10/15 2956) Antibody: PENDING (10/15 2130) Rubella: Immune (04/09 0000) RPR: Nonreactive (04/09 0000)  HBsAg: Negative (04/09 0000)  HIV: Non-reactive (04/09 0000)  GBS: NEGATIVE/-- (09/16 2237)   Assessment/Plan: IUP at 37+ weeks with CHTN, obesity, AMA, previous LTCS, for induction.  She prefers AROM, but vtc not well applied so will start low dose pitocin, monitor progress, AROM when vtx better applied to cervix, anticipate VBAC   Catherine Schroeder 11/02/2022, 8:35 AM

## 2022-11-03 ENCOUNTER — Encounter (HOSPITAL_COMMUNITY): Payer: Self-pay | Admitting: Obstetrics and Gynecology

## 2022-11-03 LAB — CBC
HCT: 37.1 % (ref 36.0–46.0)
Hemoglobin: 12.6 g/dL (ref 12.0–15.0)
MCH: 30.2 pg (ref 26.0–34.0)
MCHC: 34 g/dL (ref 30.0–36.0)
MCV: 89 fL (ref 80.0–100.0)
Platelets: 220 10*3/uL (ref 150–400)
RBC: 4.17 MIL/uL (ref 3.87–5.11)
RDW: 14.9 % (ref 11.5–15.5)
WBC: 17.8 10*3/uL — ABNORMAL HIGH (ref 4.0–10.5)
nRBC: 0 % (ref 0.0–0.2)

## 2022-11-03 MED ORDER — ONDANSETRON HCL 4 MG/2ML IJ SOLN: 4.0000 mg | INTRAMUSCULAR | Status: DC | PRN

## 2022-11-03 MED ORDER — METHYLERGONOVINE MALEATE 0.2 MG PO TABS: 0.2000 mg | ORAL_TABLET | ORAL | Status: DC | PRN

## 2022-11-03 MED ORDER — ZOLPIDEM TARTRATE 5 MG PO TABS: 5.0000 mg | ORAL_TABLET | Freq: Every evening | ORAL | Status: DC | PRN

## 2022-11-03 MED ORDER — SENNOSIDES-DOCUSATE SODIUM 8.6-50 MG PO TABS
2.0000 | ORAL_TABLET | Freq: Every day | ORAL | Status: DC
  Administered 2022-11-04 – 2022-11-05 (×2): 2 via ORAL
  Filled 2022-11-03 (×2): qty 2

## 2022-11-03 MED ORDER — COCONUT OIL OIL
1.0000 | TOPICAL_OIL | Status: DC | PRN
  Administered 2022-11-04: 1 via TOPICAL

## 2022-11-03 MED ORDER — BENZOCAINE-MENTHOL 20-0.5 % EX AERO: 1.0000 | INHALATION_SPRAY | CUTANEOUS | Status: DC | PRN

## 2022-11-03 MED ORDER — ONDANSETRON HCL 4 MG PO TABS: 4.0000 mg | ORAL_TABLET | ORAL | Status: DC | PRN

## 2022-11-03 MED ORDER — LEVOTHYROXINE SODIUM 50 MCG PO TABS
25.0000 ug | ORAL_TABLET | ORAL | Status: DC
  Administered 2022-11-04: 25 ug via ORAL
  Filled 2022-11-03: qty 1

## 2022-11-03 MED ORDER — DIPHENHYDRAMINE HCL 25 MG PO CAPS: 25.0000 mg | ORAL_CAPSULE | Freq: Four times a day (QID) | ORAL | Status: DC | PRN

## 2022-11-03 MED ORDER — ACETAMINOPHEN 325 MG PO TABS
650.0000 mg | ORAL_TABLET | ORAL | Status: DC | PRN
  Administered 2022-11-03: 650 mg via ORAL
  Filled 2022-11-03: qty 2

## 2022-11-03 MED ORDER — WITCH HAZEL-GLYCERIN EX PADS: 1.0000 | MEDICATED_PAD | CUTANEOUS | Status: DC | PRN

## 2022-11-03 MED ORDER — DIBUCAINE (PERIANAL) 1 % EX OINT: 1.0000 | TOPICAL_OINTMENT | CUTANEOUS | Status: DC | PRN

## 2022-11-03 MED ORDER — LEVOTHYROXINE SODIUM 50 MCG PO TABS
50.0000 ug | ORAL_TABLET | Freq: Every day | ORAL | Status: DC
  Administered 2022-11-03: 50 ug via ORAL
  Filled 2022-11-03: qty 1

## 2022-11-03 MED ORDER — METHYLERGONOVINE MALEATE 0.2 MG/ML IJ SOLN: 0.2000 mg | INTRAMUSCULAR | Status: DC | PRN

## 2022-11-03 MED ORDER — MEASLES, MUMPS & RUBELLA VAC IJ SOLR: 0.5000 mL | Freq: Once | INTRAMUSCULAR | Status: DC

## 2022-11-03 MED ORDER — IBUPROFEN 600 MG PO TABS
600.0000 mg | ORAL_TABLET | Freq: Four times a day (QID) | ORAL | Status: DC
  Administered 2022-11-03 – 2022-11-05 (×10): 600 mg via ORAL
  Filled 2022-11-03 (×10): qty 1

## 2022-11-03 MED ORDER — MAGNESIUM HYDROXIDE 400 MG/5ML PO SUSP: 30.0000 mL | ORAL | Status: DC | PRN

## 2022-11-03 MED ORDER — NIFEDIPINE ER OSMOTIC RELEASE 30 MG PO TB24: 30.0000 mg | ORAL_TABLET | Freq: Every day | ORAL | Status: DC

## 2022-11-03 MED ORDER — OXYCODONE HCL 5 MG PO TABS: 5.0000 mg | ORAL_TABLET | ORAL | Status: DC | PRN

## 2022-11-03 MED ORDER — SIMETHICONE 80 MG PO CHEW: 80.0000 mg | CHEWABLE_TABLET | ORAL | Status: DC | PRN

## 2022-11-03 MED ORDER — NIFEDIPINE ER OSMOTIC RELEASE 30 MG PO TB24
30.0000 mg | ORAL_TABLET | Freq: Every day | ORAL | Status: DC
  Administered 2022-11-03 – 2022-11-05 (×3): 30 mg via ORAL
  Filled 2022-11-03 (×3): qty 1

## 2022-11-03 MED ORDER — OXYCODONE HCL 5 MG PO TABS: 10.0000 mg | ORAL_TABLET | ORAL | Status: DC | PRN

## 2022-11-03 MED ORDER — PRENATAL MULTIVITAMIN CH
1.0000 | ORAL_TABLET | Freq: Every day | ORAL | Status: DC
  Administered 2022-11-03 – 2022-11-05 (×3): 1 via ORAL
  Filled 2022-11-03 (×3): qty 1

## 2022-11-03 MED ORDER — LEVOTHYROXINE SODIUM 50 MCG PO TABS
50.0000 ug | ORAL_TABLET | ORAL | Status: DC
  Administered 2022-11-05: 50 ug via ORAL
  Filled 2022-11-03: qty 1

## 2022-11-03 MED ORDER — TETANUS-DIPHTH-ACELL PERTUSSIS 5-2.5-18.5 LF-MCG/0.5 IM SUSY: 0.5000 mL | PREFILLED_SYRINGE | Freq: Once | INTRAMUSCULAR | Status: DC

## 2022-11-03 NOTE — Progress Notes (Signed)
Post Partum Day 0 Subjective: no complaints, up ad lib, voiding, and tolerating PO  Objective: Blood pressure 133/87, pulse 98, temperature 99.3 F (37.4 C), temperature source Oral, resp. rate 18, height 5\' 5"  (1.651 m), weight 110.3 kg, last menstrual period 02/12/2022, SpO2 99%, unknown if currently breastfeeding.  Physical Exam:  General: alert and no distress Lochia: appropriate Uterine Fundus: firm Incision: N/A DVT Evaluation: No evidence of DVT seen on physical exam. No significant calf/ankle edema.  Recent Labs    11/02/22 1700 11/03/22 0248  HGB 12.8 12.6  HCT 37.0 37.1    Assessment/Plan: PPD#0 s/p VBAC following IOL for cHTN  - Doing well postpartum  - cHTN: Resume Procardia 30 XL. DC Labetalol. Monitor BP, titrate as needed - Hypothyroidism: Resume pre-pregnancy synthroid dose, 50 mcg qdaily - Anticipate DC home tomorrow   LOS: 1 day   Junius Creamer, DO 11/03/2022, 10:49 AM

## 2022-11-03 NOTE — Progress Notes (Signed)
Order for repeat cbc not done, l&d nurse states repeat was already done at 0215 for removal of epidural;pt stable.

## 2022-11-03 NOTE — Anesthesia Postprocedure Evaluation (Signed)
Anesthesia Post Note  Patient: TZIVIA ONEIL  Procedure(s) Performed: AN AD HOC LABOR EPIDURAL     Patient location during evaluation: Mother Baby Anesthesia Type: Epidural Level of consciousness: awake and alert and oriented Pain management: satisfactory to patient Vital Signs Assessment: post-procedure vital signs reviewed and stable Respiratory status: respiratory function stable Cardiovascular status: stable Postop Assessment: no headache, no backache, epidural receding, patient able to bend at knees, no signs of nausea or vomiting, adequate PO intake and able to ambulate Anesthetic complications: no   No notable events documented.  Last Vitals:  Vitals:   11/03/22 0312 11/03/22 0434  BP: (!) 139/91 (!) 145/83  Pulse: 95 93  Resp: 20 18  Temp: 36.9 C   SpO2: 100% 97%    Last Pain:  Vitals:   11/03/22 0314  TempSrc:   PainSc: 0-No pain   Pain Goal:                   Blanca Carreon

## 2022-11-03 NOTE — Lactation Note (Signed)
This note was copied from a baby's chart. Lactation Consultation Note  Patient Name: Boy Lazaria Schaben [id number] Date: 11/03/2022 Age:35 hours Reason for consult: Initial assessment;Early term 37-38.6wks;Maternal endocrine disorder  P5, [redacted]w[redacted]d. Mother is experienced with breastfeeding.  Feed on demand with cues.  Goal 8-12+ times per day after first 24 hrs.  Place baby STS if not cueing.  Pacifier use not recommended at this time. Wake baby for feedings if needed. Suggest calling for help as needed.    Maternal Data Has patient been taught Hand Expression?: Yes Does the patient have breastfeeding experience prior to this delivery?: Yes How long did the patient breastfeed?: 12-21 mos.  Feeding Mother's Current Feeding Choice: Breast Milk  Interventions Interventions: Breast feeding basics reviewed;Education;LC Services brochure   Consult Status Consult Status: Follow-up Date: 11/04/22 Follow-up type: In-patient    Dahlia Byes [facility] 11/03/2022, 9:26 AM

## 2022-11-04 LAB — BIRTH TISSUE RECOVERY COLLECTION (PLACENTA DONATION)

## 2022-11-04 MED ORDER — IBUPROFEN 800 MG PO TABS: 800.0000 mg | ORAL_TABLET | Freq: Three times a day (TID) | ORAL | 1 refills | Status: DC | PRN

## 2022-11-04 NOTE — Discharge Summary (Addendum)
Postpartum Discharge Summary  Date of Service updated     Patient Name: Catherine Schroeder DOB: 02/24/1985 MRN: [id number]  Date of admission: 11/02/2022 Delivery date:11/03/2022 Delivering provider: Jackelyn Knife, TODD Date of discharge: 11/05/2022  Admitting diagnosis: Pregnancy [Z34.90] Intrauterine pregnancy: [redacted]w[redacted]d     Secondary diagnosis:  Principal Problem:   Pregnancy  Additional problems: CHTN on meds, Hypothyroidism   Discharge diagnosis: Term Pregnancy Delivered, VBAC, and CHTN                                              Post partum procedures: none Augmentation: AROM and Pitocin Complications: None  Hospital course: Induction of Labor With Vaginal Delivery   37 y.o. yo [id number] at [redacted]w[redacted]d was admitted to the hospital 11/02/2022 for induction of labor.  Indication for induction:  CHTN on meds with AMA and BMI 40 .  Patient had an labor course complicated by none Membrane Rupture Time/Date: 2:40 PM,11/02/2022  Delivery Method:VBAC, Spontaneous Episiotomy: None Lacerations:  None Details of delivery can be found in separate delivery note.  Patient had a postpartum course complicated by none. Patient is discharged home 11/05/22.  Newborn Data: Birth date:11/03/2022 Birth time:1:00 AM Gender:Female Living status:Living Apgars:9 ,9  Weight:3090 g  T-DaP:Given prenatally  Immunizations administered: Immunization History  Administered Date(s) Administered   Influenza Split 11/19/2010   Tdap 11/19/2010, 09/06/2012    Physical exam  Vitals:   11/04/22 1513 11/04/22 2150 11/04/22 2300 11/05/22 0507  BP: 129/86 (!) 130/90 139/74 (!) 136/94  Pulse: 89 100  86  Resp: 17 16  17   Temp: 98.5 F (36.9 C) 98 F (36.7 C)  99 F (37.2 C)  TempSrc: Oral Oral    SpO2: 100% 100%  100%  Weight:      Height:       General: alert, cooperative, and no distress Lochia: appropriate Uterine Fundus: firm Incision: N/A DVT Evaluation: No evidence of DVT seen on physical  exam. Negative Homan's sign. No cords or calf tenderness. Labs: Lab Results  Component Value Date   WBC 17.8 (H) 11/03/2022   HGB 12.6 11/03/2022   HCT 37.1 11/03/2022   MCV 89.0 11/03/2022   PLT 220 11/03/2022      Latest Ref Rng & Units 11/02/2022    8:07 AM  CMP  Glucose 70 - 99 mg/dL 85   BUN 6 - 20 mg/dL <5   Creatinine 5.36 - 1.00 mg/dL 6.44   Sodium 034 - 742 mmol/L 136   Potassium 3.5 - 5.1 mmol/L 3.4   Chloride 98 - 111 mmol/L 105   CO2 22 - 32 mmol/L 21   Calcium 8.9 - 10.3 mg/dL 9.5   Total Protein 6.5 - 8.1 g/dL 6.2   Total Bilirubin 0.3 - 1.2 mg/dL 0.8   Alkaline Phos 38 - 126 U/L 102   AST 15 - 41 U/L 22   ALT 0 - 44 U/L 13    Edinburgh Score:    11/03/2022   11:41 AM  Edinburgh Postnatal Depression Scale Screening Tool  I have been able to laugh and see the funny side of things. 0  I have looked forward with enjoyment to things. 0  I have blamed myself unnecessarily when things went wrong. 0  I have been anxious or worried for no good reason. 0  I have felt scared or panicky for no good reason.  0  Things have been getting on top of me. 1  I have been so unhappy that I have had difficulty sleeping. 0  I have felt sad or miserable. 0  I have been so unhappy that I have been crying. 0  The thought of harming myself has occurred to me. 0  Edinburgh Postnatal Depression Scale Total 1      After visit meds:  Allergies as of 11/05/2022       Reactions   Cashew Nut (anacardium Occidentale) Skin Test Hives        Medication List     STOP taking these medications    aspirin EC 81 MG tablet   ondansetron 8 MG disintegrating tablet Commonly known as: ZOFRAN-ODT   promethazine 25 MG tablet Commonly known as: PHENERGAN   ZOFRAN PO       TAKE these medications    acetaminophen 325 MG tablet Commonly known as: Tylenol Take 2 tablets (650 mg total) by mouth every 4 (four) hours as needed (for pain scale < 4).   folic acid 1 MG  tablet Commonly known as: FOLVITE Take 1 mg by mouth daily.   ibuprofen 200 MG tablet Commonly known as: ADVIL Take 3 tablets (600 mg total) by mouth every 6 (six) hours as needed.   labetalol 300 MG tablet Commonly known as: NORMODYNE Take 1 tablet (300 mg total) by mouth 2 (two) times daily. What changed:  medication strength how much to take   levothyroxine 25 MCG tablet Commonly known as: SYNTHROID Take 25 mcg by mouth 4 (four) times a week. Evern Bio, Thurs, and Sat What changed: Another medication with the same name was removed. Continue taking this medication, and follow the directions you see here.   prenatal vitamin w/FE, FA 27-1 MG Tabs tablet Take 1 tablet by mouth daily.   PROCARDIA PO Take 30 mg by mouth.         Discharge home in stable condition Infant Feeding: Breast Infant Disposition:home with mother Discharge instruction: per After Visit Summary and Postpartum booklet. Activity: Advance as tolerated. Pelvic rest for 6 weeks.  Diet: routine diet Anticipated Birth Control: Unsure Postpartum Appointment:6 weeks Additional Postpartum F/U: BP check 1 week Follow up Visit:  Follow-up Information     Ob/Gyn, [place] Follow up in 1 week(s).   Why: For blood pressure check Contact information: [address] [place] [address] [phone]                Andree Elk   11/05/2022 Willa Frater, MD

## 2022-11-04 NOTE — Progress Notes (Signed)
Post Partum Day 1 Subjective: no complaints, up ad lib, voiding, and tolerating PO  Objective: Blood pressure 135/81, pulse 78, temperature 98 F (36.7 C), temperature source Oral, resp. rate 18, height 5\' 5"  (1.651 m), weight 110.3 kg, last menstrual period 02/12/2022, SpO2 100%, unknown if currently breastfeeding.  Physical Exam:  General: alert and no distress Lochia: appropriate Uterine Fundus: firm Incision: N/A DVT Evaluation: No evidence of DVT seen on physical exam. No significant calf/ankle edema.  Recent Labs    11/02/22 1700 11/03/22 0248  HGB 12.8 12.6  HCT 37.0 37.1    Assessment/Plan: PPD#1 s/p VBAC following IOL for cHTN  - Doing well postpartum  - cHTN: Resume Procardia 30 XL. DC Labetalol. Monitor BP, titrate as needed - hs been normotensive since delivery, will DC on current regimen with plans for pp BP check - Hypothyroidism: Resume pre-pregnancy synthroid dose, 50 mcg qdaily - Anticipate DC home today after circumcision for baby boy   LOS: 2 days   Carlisle Cater, MD 11/04/2022, 8:49 AM

## 2022-11-04 NOTE — Lactation Note (Signed)
This note was copied from a baby's chart. Lactation Consultation Note  Patient Name: Catherine Schroeder [id number] Date: 11/04/2022 Age:37 years  Reason for consult: Follow-up assessment;Early term 37-38.6wks;Mother's request;Maternal endocrine disorder  P5, [redacted]w[redacted]d, 4% weight loss  Mother requested to see LC due to her concerns of infant's intake at breast. This is mother's 5th child and she has successfully breast fed her first 3 children and pumped with her last child (now 35 yrs old) due to preterm birth. She said she had to supplement with formula while providing expressed milk to her daughter. Mother is concerned that baby is not getting enough at breast currently and requesting to pump to stimulate her milk supply.  Instructed mother on the use, frequency, cleaning of breast pump and storage of breast milk. She pumped for 15 minutes in initiation phase and did not collect colostrum. Mother plans to continue to pump after breastfeeding and will supplement baby with her EBM when collecting.  Mother states baby has been latching well and he is content after breastfeeding. Baby went for his circumcision. Encouraged mother to call for breastfeeding assistance and name left on white board.     Feeding Mother's Current Feeding Choice: Breast Milk   Lactation Tools Discussed/Used Tools: Pump;Flanges;Coconut oil Flange Size: 24 Breast pump type: Double-Electric Breast Pump Pump Education: Setup, frequency, and cleaning;Milk Storage;Other (comment) (handout on storage given) Reason for Pumping: stimulate milk production, provide infant with supplement Pumping frequency: every 3 hrs for 15 min on initiation setting Pumped volume: 0 mL  Interventions Interventions: DEBP;Education  Discharge Pump: DEBP;Personal  Consult Status Consult Status: Follow-up Date: 11/05/22    Omar Person, RN, IBCLC 11/04/2022, 9:55 AM

## 2022-11-05 MED ORDER — IBUPROFEN 200 MG PO TABS: 600.0000 mg | ORAL_TABLET | Freq: Four times a day (QID) | ORAL | Status: AC | PRN

## 2022-11-05 MED ORDER — LABETALOL HCL 200 MG PO TABS
300.0000 mg | ORAL_TABLET | Freq: Two times a day (BID) | ORAL | Status: DC
  Administered 2022-11-05: 300 mg via ORAL
  Filled 2022-11-05 (×2): qty 1

## 2022-11-05 MED ORDER — ACETAMINOPHEN 325 MG PO TABS: 650.0000 mg | ORAL_TABLET | ORAL | Status: AC | PRN

## 2022-11-05 NOTE — Progress Notes (Addendum)
Post Partum Day 2 Subjective: Patient is doing well this morning. Pain is controlled. Ambulating, voiding, tolerating PO. Minimal lochia. Breastfeeding.   Objective: Patient Vitals for the past 24 hrs:  BP Temp Temp src Pulse Resp SpO2  11/05/22 0507 (!) 136/94 99 F (37.2 C) -- 86 17 100 %  11/04/22 2300 139/74 -- -- -- -- --  11/04/22 2150 (!) 130/90 98 F (36.7 C) Oral 100 16 100 %  11/04/22 1513 129/86 98.5 F (36.9 C) Oral 89 17 100 %    Physical Exam:  General: alert, cooperative, and no distress Lochia: appropriate Uterine Fundus: firm DVT Evaluation: No evidence of DVT seen on physical exam.  Recent Labs    11/02/22 1700 11/03/22 0248  WBC 10.8* 17.8*  HGB 12.8 12.6  HCT 37.0 37.1  PLT 220 220    No results for input(s): "NA", "K", "CL", "CO2CT", "BUN", "CREATININE", "GLUCOSE", "BILITOT", "ALT", "AST", "ALKPHOS", "PROT", "ALBUMIN" in the last 72 hours.  No results for input(s): "CALCIUM", "MG", "PHOS" in the last 72 hours.  No results for input(s): "PROTIME", "APTT", "INR" in the last 72 hours.  No results for input(s): "PROTIME", "APTT", "INR", "FIBRINOGEN" in the last 72 hours. Assessment/Plan: Catherine Schroeder 37 y.o. [id number] PPD#2 sp VBAC 1. PPC: Routine 2. cHTN: Continue procardia 30mg  X, restart labetalol 300mg  BID due to elevated Bps. Rubella Immune, blood type O pos, breastfeeding, baby boy (s/p circ on 10/17). Vaccines: tdap given during [facility]  Dispo: Anticipate discharge today.   LOS: 3 days   Jadence Kinlaw A Abdou Stocks 11/05/2022, 9:39 AM

## 2022-11-05 NOTE — Lactation Note (Signed)
This note was copied from a baby's chart. Lactation Consultation Note  Patient Name: Catherine Schroeder Date: 11/05/2022 Age:35 hours Reason for consult: Follow-up assessment;Early term 37-38.6wks Experienced BF mom stated this baby has been BF very well. Baby was on the breast when LC came into rm. Reviewed newborn feeding habits, engorgement, milk storage, pumping, STS, I&O. Mom has no further questions or concerns. Feels good about how BF is going and looking forward to taking new baby home.  Maternal Data    Feeding    LATCH Score Latch: Grasps breast easily, tongue down, lips flanged, rhythmical sucking.  Audible Swallowing: Spontaneous and intermittent  Type of Nipple: Everted at rest and after stimulation  Comfort (Breast/Nipple): Soft / non-tender  Hold (Positioning): No assistance needed to correctly position infant at breast.  LATCH Score: 10   Lactation Tools Discussed/Used    Interventions Interventions: Breast feeding basics reviewed  Discharge Discharge Education: Engorgement and breast care;Warning signs for feeding baby  Consult Status Consult Status: Complete Date: 11/05/22    Charyl Dancer 11/05/2022, 4:14 AM

## 2022-11-05 NOTE — Progress Notes (Signed)
Called Dr. Reina Fuse regarding BP of 130/90, 1 hour later 139/74. Call MD for BP reading of greater than or equal to (systolic or diastolic) 145/95. Continue procardia daily otherwise.

## 2022-11-06 DIAGNOSIS — R011 Cardiac murmur, unspecified: Secondary | ICD-10-CM | POA: Diagnosis not present

## 2022-11-06 DIAGNOSIS — Z00110 Health examination for newborn under 8 days old: Secondary | ICD-10-CM | POA: Diagnosis not present

## 2022-11-07 NOTE — Plan of Care (Signed)
[facility] Tonsillectomy/Adenoidectomy, Postoperative PEDS care plan entered in error.

## 2022-11-08 ENCOUNTER — Inpatient Hospital Stay (HOSPITAL_COMMUNITY): Payer: BC Managed Care – PPO

## 2022-11-08 IMAGING — US US OB COMP LESS 14 WK
1 series · 15 of 28 positions shown · non-contrast
Comparison: 03/16/2021

CLINICAL DATA: Fetal demise due to miscarriage, LMP 02/01/2021

EXAM:
OBSTETRIC <14 WK ULTRASOUND
TECHNIQUE: Transabdominal ultrasound was performed for evaluation of the
gestation as well as the maternal uterus and adnexal regions.

[Series 1: us ob comp less 14 wks mc & wl · 15 of 44 slices shown]
[im 1/44]
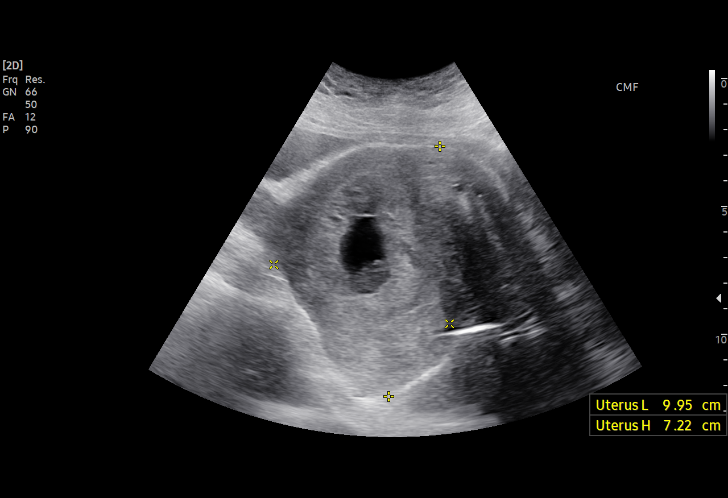
[im 4/44]
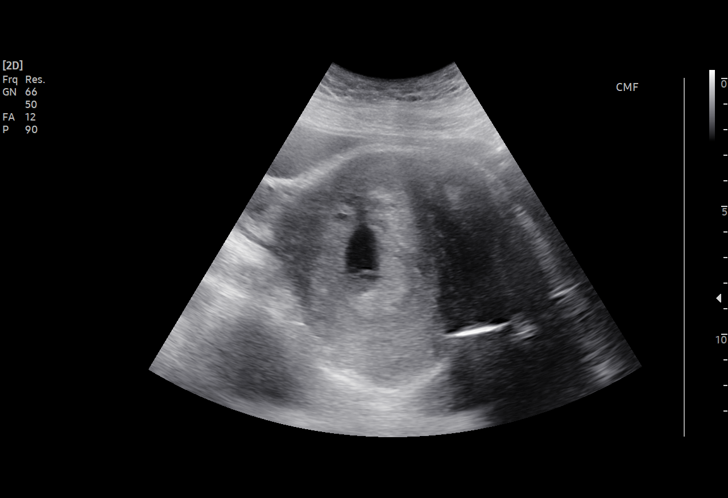
[im 7/44]
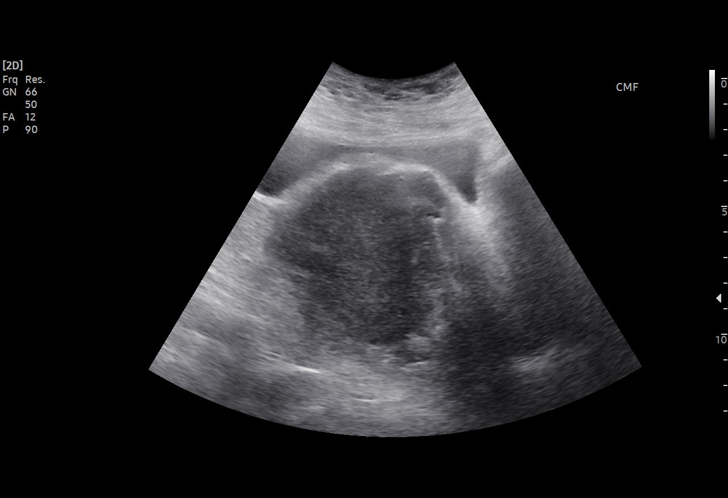
[im 10/44]
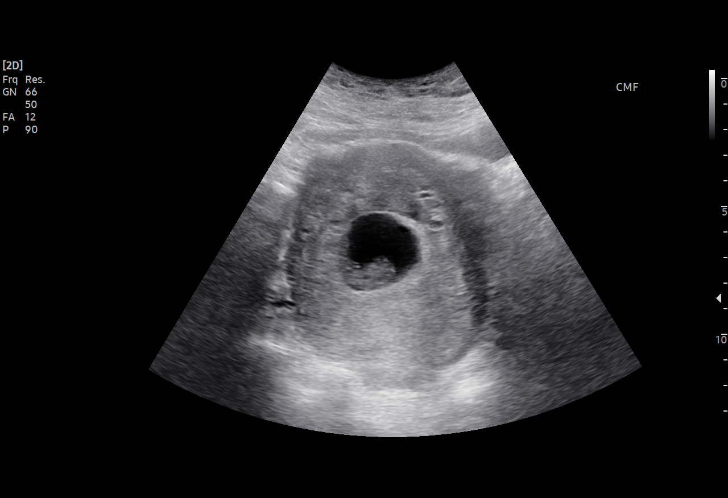
[im 13/44]
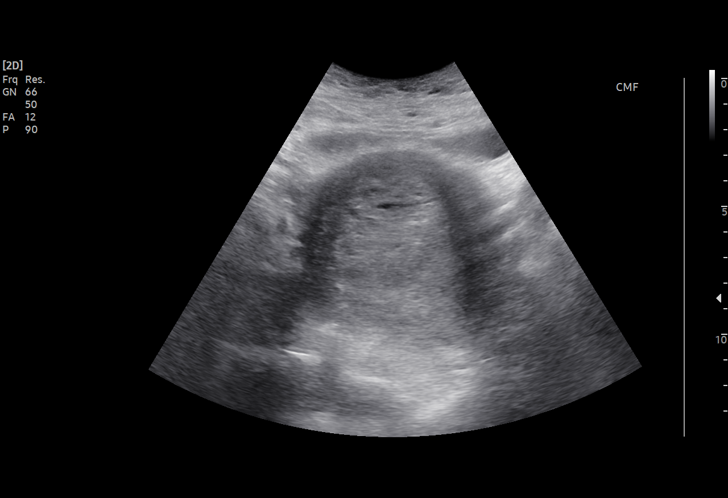
[im 16/44]
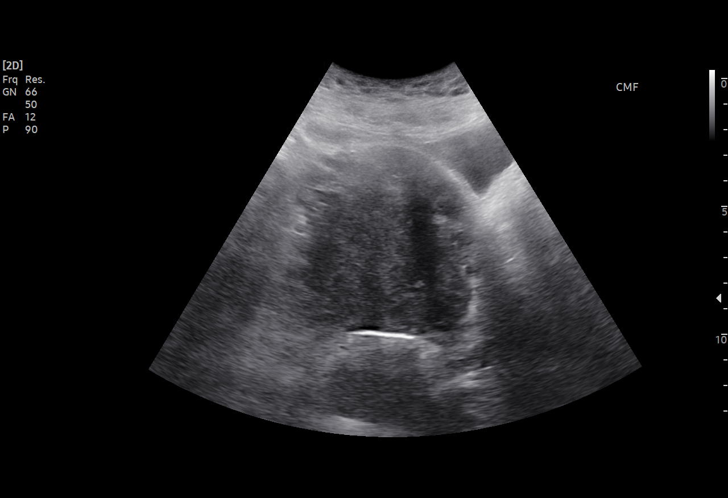
[im 20/44]
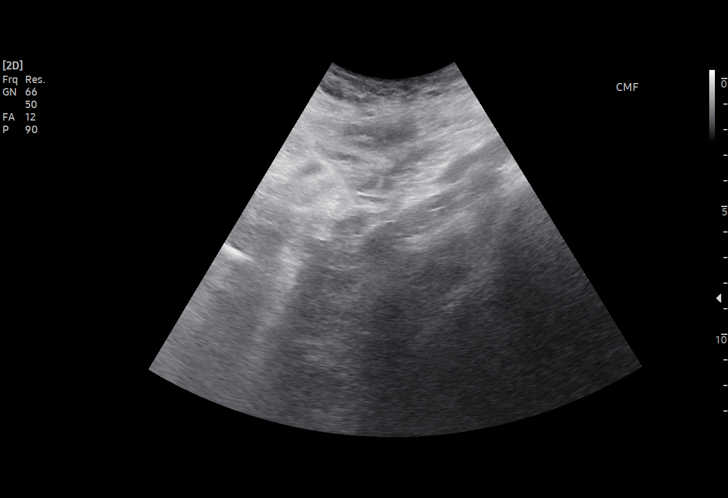
[im 23/44]
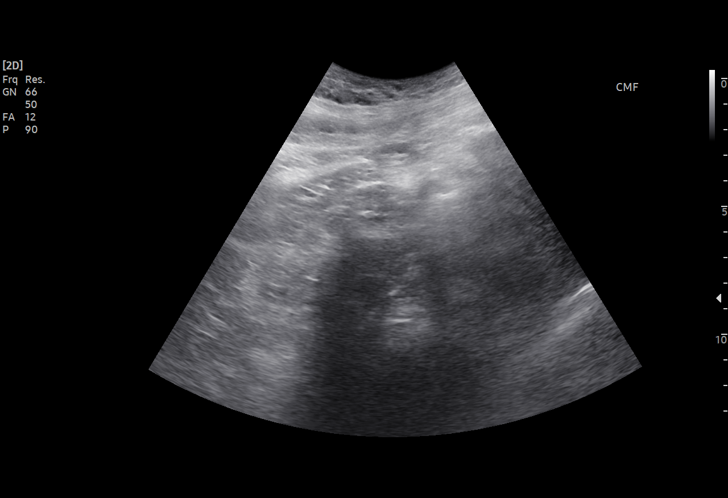
[im 24/44]
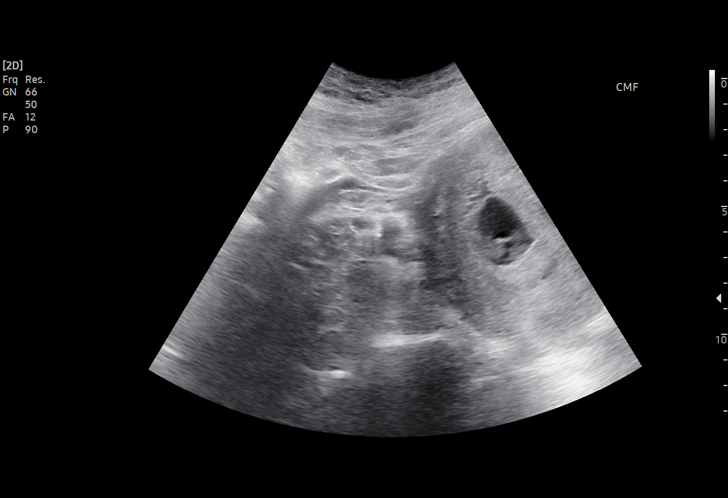
[im 28/44]
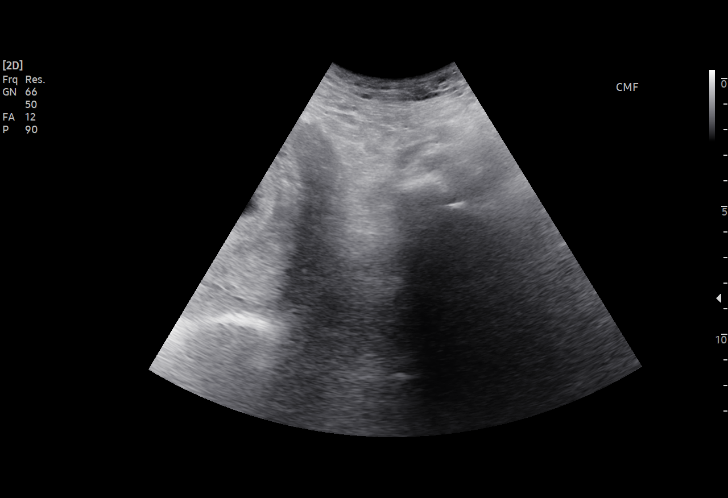
[im 31/44]
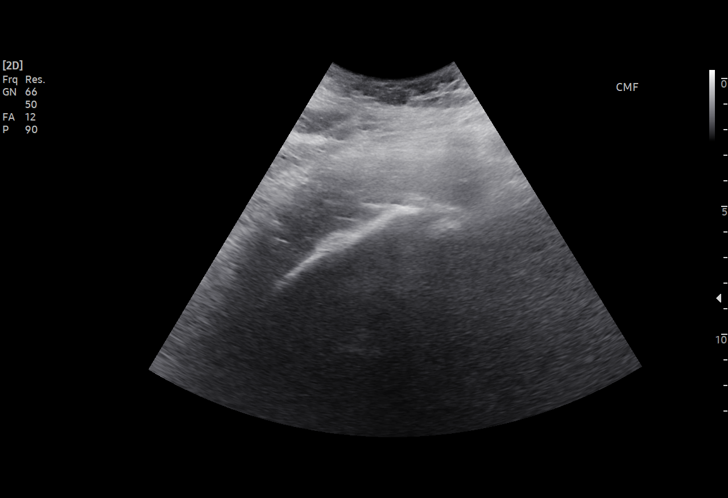
[im 34/44]
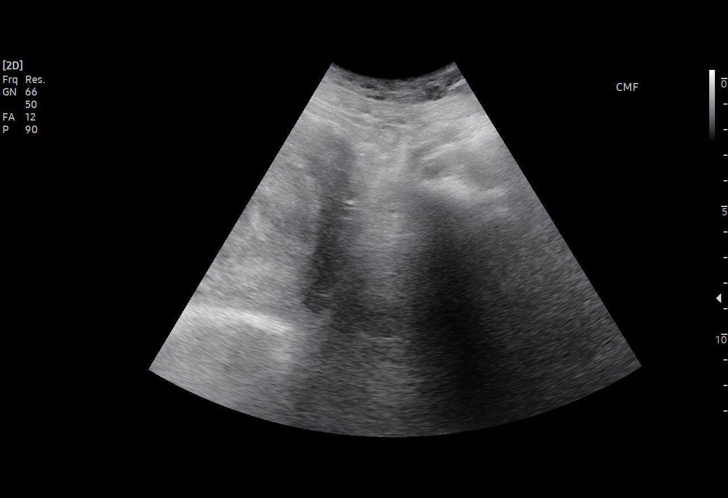
[im 37/44]
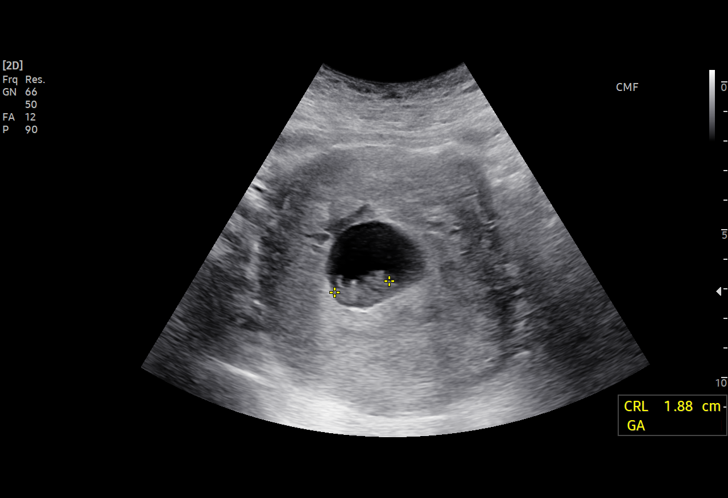
[im 40/44]
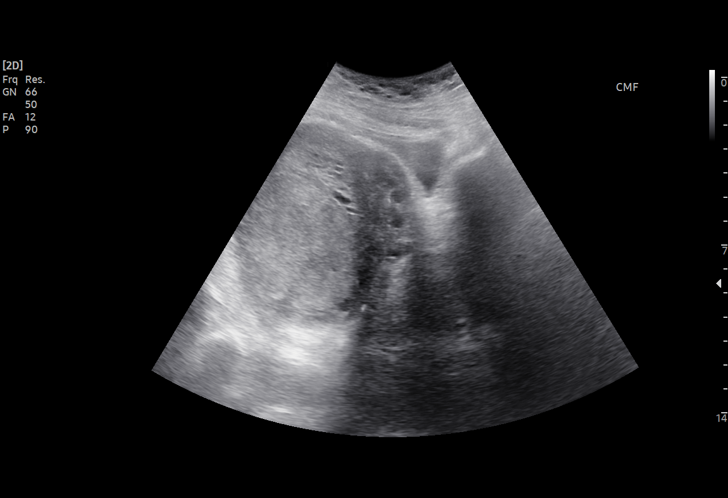
[im 44/44]
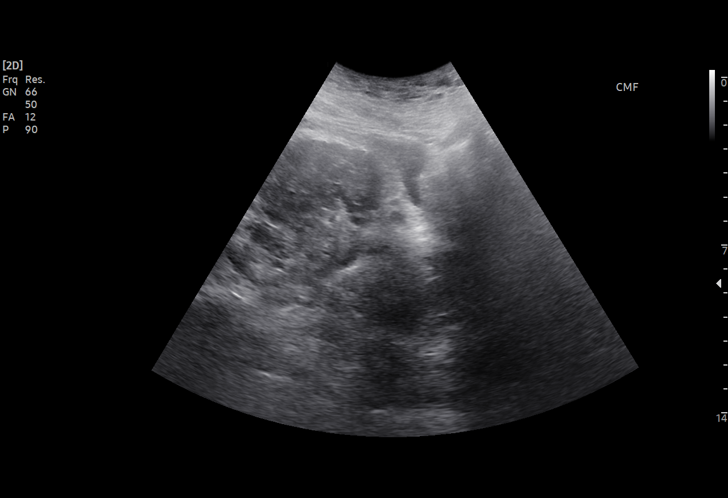

[15 of 28 positions shown; findings below may reference images not displayed]

FINDINGS: Intrauterine gestational sac: Present

Yolk sac:  Not identified

Embryo:  Present

Cardiac Activity: Not identify

Heart Rate: N/A bpm

CRL:   18.8 mm   8 w 3 5 d                  US EDC:

Subchorionic hemorrhage:  None visualized.

Maternal uterus/adnexae:

Remainder of uterus unremarkable.

Neither ovary visualized.

No free pelvic fluid or adnexal masses.
IMPRESSION: Gestational sac is seen within the uterus containing a fetal pole.

No fetal cardiac activity is identified.

Findings meet definitive criteria for failed pregnancy. This follows
SRU consensus guidelines: Diagnostic Criteria for Nonviable
Pregnancy Early in the First Trimester. N Engl J Med

## 2022-11-09 ENCOUNTER — Ambulatory Visit: Payer: BC Managed Care – PPO

## 2022-11-25 ENCOUNTER — Telehealth (HOSPITAL_COMMUNITY): Payer: Self-pay | Admitting: *Deleted

## 2022-11-25 DIAGNOSIS — H04201 Unspecified epiphora, right lacrimal gland: Secondary | ICD-10-CM | POA: Diagnosis not present

## 2022-11-25 DIAGNOSIS — L211 Seborrheic infantile dermatitis: Secondary | ICD-10-CM | POA: Diagnosis not present

## 2022-11-25 NOTE — Telephone Encounter (Signed)
11/25/2022  Name: Catherine Schroeder MRN: [id number] DOB: Feb 19, 1985  Reason for Call:  Transition of Care Hospital Discharge Call  Contact Status: Patient Contact Status: Complete  Language assistant needed: Interpreter Mode: Interpreter Not Needed        Follow-Up Questions: Do You Have Any Concerns About Your Health As You Heal From Delivery?: Yes What Concerns Do You Have About Your Health?: She reports still having some pelvic pain and also wonders if it is still normal to be bleeding.  We reviewed parameters for normal lochia and when to call her provider.  She suspects that she may be doing too much physical activity too soon because she is also caring for her four other children.  She describes the pelvic pain as being mid-pelvis and intermittent.  Denies fever.  Advised her to call provider if pain does not begin to decrease in intensity or frequency.  Patient has not been taking prescribed ibuprofen because she wants to minimize medication in her breastmilk.  Reassured her that ibuprofen is safe with breastfeeding. Do You Have Any Concerns About Your Infants Health?: No  Edinburgh Postnatal Depression Scale:  In the Past 7 Days:    PHQ2-9 Depression Scale:     Discharge Follow-up: Edinburgh score requires follow up?:  (screened with [facility] nurse recently - no concerns raised, endorses she is feeling well emotionally) Patient was advised of the following resources:: Support Group, Breastfeeding Support Group  Post-discharge interventions: Reviewed Newborn Safe Sleep Practices  Salena Saner, RN 11/25/2022 14:51

## 2022-11-29 DIAGNOSIS — M5489 Other dorsalgia: Secondary | ICD-10-CM | POA: Diagnosis not present

## 2022-12-02 DIAGNOSIS — Q2542 Hypoplasia of aorta: Secondary | ICD-10-CM | POA: Diagnosis not present

## 2022-12-02 DIAGNOSIS — Q2111 Secundum atrial septal defect: Secondary | ICD-10-CM | POA: Diagnosis not present

## 2022-12-02 DIAGNOSIS — Q256 Stenosis of pulmonary artery: Secondary | ICD-10-CM | POA: Diagnosis not present

## 2022-12-07 DIAGNOSIS — Z1331 Encounter for screening for depression: Secondary | ICD-10-CM | POA: Diagnosis not present

## 2022-12-07 DIAGNOSIS — Z124 Encounter for screening for malignant neoplasm of cervix: Secondary | ICD-10-CM | POA: Diagnosis not present

## 2022-12-07 DIAGNOSIS — E039 Hypothyroidism, unspecified: Secondary | ICD-10-CM | POA: Diagnosis not present

## 2022-12-22 DIAGNOSIS — L989 Disorder of the skin and subcutaneous tissue, unspecified: Secondary | ICD-10-CM | POA: Diagnosis not present

## 2022-12-22 DIAGNOSIS — I10 Essential (primary) hypertension: Secondary | ICD-10-CM | POA: Diagnosis not present
